# Patient Record
Sex: Male | Born: 1935 | Race: White | Hispanic: No | Marital: Married | Smoking: Former smoker
Health system: Southern US, Community
[De-identification: ages and names within clinical notes are randomized; demographics above are authoritative.]

## PROBLEM LIST (undated history)

## (undated) DIAGNOSIS — I10 Essential (primary) hypertension: Secondary | ICD-10-CM

## (undated) DIAGNOSIS — I219 Acute myocardial infarction, unspecified: Secondary | ICD-10-CM

## (undated) DIAGNOSIS — K259 Gastric ulcer, unspecified as acute or chronic, without hemorrhage or perforation: Secondary | ICD-10-CM

## (undated) DIAGNOSIS — C801 Malignant (primary) neoplasm, unspecified: Secondary | ICD-10-CM

## (undated) DIAGNOSIS — Z923 Personal history of irradiation: Secondary | ICD-10-CM

## (undated) DIAGNOSIS — N183 Chronic kidney disease, stage 3 unspecified: Secondary | ICD-10-CM

## (undated) DIAGNOSIS — N400 Enlarged prostate without lower urinary tract symptoms: Secondary | ICD-10-CM

## (undated) HISTORY — PX: WRIST SURGERY: SHX841

## (undated) HISTORY — PX: CATARACT EXTRACTION, BILATERAL: SHX1313

## (undated) HISTORY — PX: VASECTOMY: SHX75

## (undated) HISTORY — PX: APPENDECTOMY: SHX54

## (undated) HISTORY — DX: Personal history of irradiation: Z92.3

---

## 2000-06-07 ENCOUNTER — Encounter: Payer: Self-pay | Admitting: Internal Medicine

## 2000-06-07 ENCOUNTER — Encounter: Admission: RE | Admit: 2000-06-07 | Discharge: 2000-06-07 | Payer: Self-pay | Admitting: Internal Medicine

## 2000-07-05 ENCOUNTER — Encounter: Payer: Self-pay | Admitting: Emergency Medicine

## 2000-07-05 ENCOUNTER — Emergency Department (HOSPITAL_COMMUNITY): Admission: EM | Admit: 2000-07-05 | Discharge: 2000-07-05 | Payer: Self-pay | Admitting: Emergency Medicine

## 2000-09-06 ENCOUNTER — Encounter: Admission: RE | Admit: 2000-09-06 | Discharge: 2000-09-06 | Payer: Self-pay | Admitting: Specialist

## 2000-09-06 ENCOUNTER — Encounter: Payer: Self-pay | Admitting: Specialist

## 2002-02-12 HISTORY — PX: CYSTOSCOPY W/ URETERAL STENT PLACEMENT: SHX1429

## 2002-03-03 ENCOUNTER — Encounter: Payer: Self-pay | Admitting: *Deleted

## 2002-03-03 ENCOUNTER — Ambulatory Visit (HOSPITAL_COMMUNITY): Admission: EM | Admit: 2002-03-03 | Discharge: 2002-03-03 | Payer: Self-pay | Admitting: *Deleted

## 2002-03-05 ENCOUNTER — Ambulatory Visit (HOSPITAL_BASED_OUTPATIENT_CLINIC_OR_DEPARTMENT_OTHER): Admission: RE | Admit: 2002-03-05 | Discharge: 2002-03-05 | Payer: Self-pay | Admitting: Urology

## 2002-03-05 ENCOUNTER — Encounter: Payer: Self-pay | Admitting: Urology

## 2002-04-05 ENCOUNTER — Observation Stay (HOSPITAL_COMMUNITY): Admission: EM | Admit: 2002-04-05 | Discharge: 2002-04-06 | Payer: Self-pay | Admitting: Emergency Medicine

## 2002-04-05 ENCOUNTER — Encounter: Payer: Self-pay | Admitting: Emergency Medicine

## 2002-04-06 ENCOUNTER — Encounter: Payer: Self-pay | Admitting: Urology

## 2002-07-16 ENCOUNTER — Encounter: Payer: Self-pay | Admitting: Internal Medicine

## 2002-07-16 ENCOUNTER — Ambulatory Visit (HOSPITAL_COMMUNITY): Admission: RE | Admit: 2002-07-16 | Discharge: 2002-07-16 | Payer: Self-pay | Admitting: Internal Medicine

## 2002-11-28 ENCOUNTER — Ambulatory Visit (HOSPITAL_COMMUNITY): Admission: RE | Admit: 2002-11-28 | Discharge: 2002-11-28 | Payer: Self-pay | Admitting: *Deleted

## 2002-11-28 ENCOUNTER — Encounter (INDEPENDENT_AMBULATORY_CARE_PROVIDER_SITE_OTHER): Payer: Self-pay | Admitting: *Deleted

## 2005-01-15 ENCOUNTER — Encounter: Admission: RE | Admit: 2005-01-15 | Discharge: 2005-01-15 | Payer: Self-pay | Admitting: Internal Medicine

## 2005-02-24 ENCOUNTER — Ambulatory Visit (HOSPITAL_COMMUNITY): Admission: RE | Admit: 2005-02-24 | Discharge: 2005-02-24 | Payer: Self-pay | Admitting: *Deleted

## 2006-11-20 ENCOUNTER — Observation Stay (HOSPITAL_COMMUNITY): Admission: EM | Admit: 2006-11-20 | Discharge: 2006-11-21 | Payer: Self-pay | Admitting: Internal Medicine

## 2006-11-20 ENCOUNTER — Encounter: Payer: Self-pay | Admitting: Emergency Medicine
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
## 2010-07-31 NOTE — Op Note (Signed)
NAMEDARRAN, Eduardo Salazar                        ACCOUNT NO.:  [id number]   MEDICAL RECORD NO.:  [id number]                   PATIENT TYPE:  EMS   LOCATION:  ED                                   FACILITY:  [facility]   PHYSICIAN:  Excell Seltzer. Annabell Howells, M.D.                 DATE OF BIRTH:  24-Nov-1925   DATE OF PROCEDURE:  03/03/2002  DATE OF DISCHARGE:                                 OPERATIVE REPORT   PROCEDURE:  Cystoscopy with insertion of left double-J stent   PREOPERATIVE DIAGNOSIS:  Left proximal ureteral stone.   POSTOPERATIVE DIAGNOSIS:  Left proximal ureteral stone.   SURGEON:  Excell Seltzer. Annabell Howells, M.D.   ANESTHESIA:  General.   DRAINS:  6 French with 24 cm left double-J stent.   COMPLICATIONS:  None.   INDICATIONS:  Eduardo Salazar is a 77 year old white male. who presented with a 24  hour plus period of  severe left flank pain with nausea.  A CT in the ER  revealed a 7 mm proximal left ureteral stone as well as several other renal  stones on the left and a single renal stone on the right.  His pain was  poorly controlled with medications.  It was felt that stenting was indicated  in preparation for lithotripsy.   FINDINGS AND PROCEDURE:  The patient was taken to the operating room where a  general anesthetic was induced.  He was placed in lithotomy position.  His  perineum and genitalia were prepped with Betadine solution.  He was draped  in the usual sterile fashion.  Cystoscopy was performed using a 22 [place]  scope and the 12 and 70 degree lenses.  Examination revealed a normal  urethra.  The prostatic urethra had bilobar hyperplasia with some mild  obstruction.  Examination of the bladder revealed mild trabeculation.  No  tumor, stones or inflammation were noted.  The ureteral orifices were in  their normal anatomic position.  The left ureteral orifice was cannulated  with a guidewire.  This was advanced to the stone.  I was unable to get the  wire by the stone initially, but I  slipped a 5 [place] open-ended catheter  over the wire.  This stiffened the wire and allowed the wire to push the  stone back into the kidney.  The wire was then advanced to the kidney.  The  opening catheter was removed, and a 6 French 24 cm double-J stent was passed  over the wire without difficulty.  Once the stent was in position, the wire  was removed leaving a good coil of stent in the kidney and in the bladder.  This was confirmed with fluoroscopy.  The bladder was then drained; the  scope was removed.  The patient was taken down from lithotomy position.  His  anesthetic was reversed.  He was moved to the recovery room in stable  condition.  He was  given 400 mg of  Cipro IV for the procedure.  There were  no complications.                                               Excell Seltzer. Annabell Howells, M.D.    JJW/MEDQ  D:  03/03/2002  T:  03/03/2002  Job:  [id number]

## 2010-07-31 NOTE — Op Note (Signed)
NAMEMOSI, HANNOLD              ACCOUNT NO.:  [id number]   MEDICAL RECORD NO.:  [id number]          PATIENT TYPE:  AMB   LOCATION:  ENDO                         [facility]ACILITY:  [facility]   PHYSICIAN:  Georgiana Spinner, M.D.    DATE OF BIRTH:  [birth year]   DATE OF PROCEDURE:  02/24/2005  DATE OF DISCHARGE:                                 OPERATIVE REPORT   PROCEDURE:  Colonoscopy.   INDICATIONS:  Colon polyps removed by me in the past.   ANESTHESIA:  Demerol 40, Versed 6 mg.   DESCRIPTION OF PROCEDURE:  With the patient mildly sedated in the left  lateral decubitus position, a rectal exam was performed. Subsequently, the  Olympus videoscopic colonoscope was inserted into the rectum and passed  under direct vision to the cecum identified by the ileocecal valve and  appendiceal orifice both of which were photographed. From this point, the  colonoscope was slowly withdrawn taking circumferential views of the colonic  mucosa stopping only in the rectum which appeared normal on direct and  showed hemorrhoids on retroflexed view. The endoscope was straightened and  withdrawn. The patient's vital signs and pulse oximeter remained stable. The  patient tolerated the procedure well without apparent complications.   FINDINGS:  Diverticulosis of sigmoid colon, internal hemorrhoids otherwise  unremarkable examination.   PLAN:  Repeat examination possibly in 5 years.           ______________________________  Georgiana Spinner, M.D.     GMO/MEDQ  D:  02/24/2005  T:  02/24/2005  Job:  [id number]

## 2010-07-31 NOTE — Op Note (Signed)
   [name], Eduardo Salazar                        ACCOUNT NO.:  [id number]   MEDICAL RECORD NO.:  [id number]                   PATIENT TYPE:  AMB   LOCATION:  ENDO                                 FACILITY:  [facility]   PHYSICIAN:  Georgiana Spinner, M.D.                 DATE OF BIRTH:  17-Nov-1925   DATE OF PROCEDURE:  DATE OF DISCHARGE:                                 OPERATIVE REPORT   PROCEDURE:  Colonoscopy with biopsy.   INDICATIONS:  Colon polyps.   ANESTHESIA:  Demerol 50 mg, Versed 5.   DESCRIPTION OF PROCEDURE:  With the patient mildly sedated and in the left  lateral decubitus position, the Olympus videoscopic colonoscope was inserted  in the rectum and passed after normal rectal examination to the cecum,  identified by ileocecal valve and appendiceal orifice, both of which were  photographed in the cecum with a small polyp which was removed using hot  biopsy forceps technique, setting 21/50 blended current on the ERBE argon  photocoagulator pulse generator. From this point, the colonoscope was slowly  withdrawn, taking circumferential views of the colonic mucosa, stopping only  to photograph diverticulosis that was quite significant in the sigmoid colon  until it reached the rectum which appeared normal on direct and retroflexed  view.  The endoscope was straightened and withdrawn.  The patient's vital  signs and pulse oximetry remained stable.  The patient tolerated the  procedure well without apparent complications.   FINDINGS:  1. Diverticulosis significant of the sigmoid colon.  2. Small polyp in the cecum.   PLAN:  Await biopsy report.  The patient will call me with the results and  follow up with me as an outpatient.                                               Georgiana Spinner, M.D.    GMO/MEDQ  D:  11/28/2002  T:  11/28/2002  Job:  [id number]

## 2010-12-25 LAB — CBC
Hemoglobin: 14.8
MCHC: 34.2
MCV: 89.9
RBC: 4.8
RDW: 13.8

## 2010-12-25 LAB — BASIC METABOLIC PANEL
CO2: 25
Chloride: 106
Creatinine, Ser: 0.93
GFR calc Af Amer: >60
Glucose, Bld: 118 — ABNORMAL HIGH

## 2010-12-25 LAB — COMPREHENSIVE METABOLIC PANEL
CO2: 26
Calcium: 8.8
Creatinine, Ser: 1.17
GFR calc Af Amer: >60
GFR calc non Af Amer: >60
Glucose, Bld: 131 — ABNORMAL HIGH

## 2012-01-16 ENCOUNTER — Emergency Department (HOSPITAL_COMMUNITY): Payer: Medicare Other

## 2012-01-16 ENCOUNTER — Emergency Department (HOSPITAL_COMMUNITY)
Admission: EM | Admit: 2012-01-16 | Discharge: 2012-01-16 | Disposition: A | Discharge status: Home / Self Care | Payer: Medicare Other | Attending: Emergency Medicine | Admitting: Emergency Medicine

## 2012-01-16 ENCOUNTER — Encounter (HOSPITAL_COMMUNITY): Payer: Self-pay | Admitting: Emergency Medicine

## 2012-01-16 DIAGNOSIS — Y9301 Activity, walking, marching and hiking: Secondary | ICD-10-CM | POA: Insufficient documentation

## 2012-01-16 DIAGNOSIS — Z9181 History of falling: Secondary | ICD-10-CM | POA: Insufficient documentation

## 2012-01-16 DIAGNOSIS — Z872 Personal history of diseases of the skin and subcutaneous tissue: Secondary | ICD-10-CM | POA: Insufficient documentation

## 2012-01-16 DIAGNOSIS — Z8739 Personal history of other diseases of the musculoskeletal system and connective tissue: Secondary | ICD-10-CM | POA: Insufficient documentation

## 2012-01-16 DIAGNOSIS — Y929 Unspecified place or not applicable: Secondary | ICD-10-CM | POA: Insufficient documentation

## 2012-01-16 DIAGNOSIS — M6281 Muscle weakness (generalized): Secondary | ICD-10-CM | POA: Insufficient documentation

## 2012-01-16 DIAGNOSIS — R296 Repeated falls: Secondary | ICD-10-CM | POA: Insufficient documentation

## 2012-01-16 DIAGNOSIS — T07XXXA Unspecified multiple injuries, initial encounter: Secondary | ICD-10-CM | POA: Insufficient documentation

## 2012-01-16 LAB — BASIC METABOLIC PANEL
CO2: 23 mEq/L (ref 19–32)
Calcium: 9.3 mg/dL (ref 8.4–10.5)
Chloride: 102 mEq/L (ref 96–112)
Potassium: 4.5 mEq/L (ref 3.5–5.1)
Sodium: 133 mEq/L — ABNORMAL LOW (ref 135–145)

## 2012-01-16 LAB — CBC
HCT: 39.5 % (ref 39.0–52.0)
Hemoglobin: 14 g/dL (ref 13.0–17.0)
MCH: 30.6 pg (ref 26.0–34.0)
MCHC: 35.4 g/dL (ref 30.0–36.0)
MCV: 86.4 fL (ref 78.0–100.0)
Platelets: 141 K/uL — ABNORMAL LOW (ref 150–400)
RBC: 4.57 MIL/uL (ref 4.22–5.81)
RDW: 13.5 % (ref 11.5–15.5)
WBC: 7.4 K/uL (ref 4.0–10.5)

## 2012-01-16 MED ORDER — SODIUM CHLORIDE 0.9 % IV SOLN
Freq: Once | INTRAVENOUS | Status: AC
  Administered 2012-01-16: 14:00:00 via INTRAVENOUS

## 2012-01-16 MED ORDER — ONDANSETRON HCL 4 MG/2ML IJ SOLN
4.0000 mg | Freq: Once | INTRAMUSCULAR | Status: AC
  Administered 2012-01-16: 4 mg via INTRAVENOUS
  Filled 2012-01-16: qty 2

## 2012-01-16 MED ORDER — MORPHINE SULFATE 4 MG/ML IJ SOLN
4.0000 mg | Freq: Once | INTRAMUSCULAR | Status: AC
  Administered 2012-01-16: 4 mg via INTRAVENOUS
  Filled 2012-01-16: qty 1

## 2012-01-16 NOTE — ED Provider Notes (Addendum)
History     CSN: [id number]  Arrival date & time 01/16/12  1045   First MD Initiated Contact with Patient 01/16/12 1105      Chief Complaint  Patient presents with  . Fall    (Consider location/radiation/quality/duration/timing/severity/associated sxs/prior treatment) HPI Complains of left hip pain and right elbow pain after "my legs gave out from under me" approximate one hour ago while taking his morning walk. Patient's wife reports that he is prone to frequent falls and gait is unsteady at baseline. Walks with pain. No treatment prior to coming here. Pain is moderate to severe. Pain is worse with movement improved with remaining still. Past Medical History  Diagnosis Date  . Arthritis   . Left leg weakness   . Right leg weakness   . Skin infection     History reviewed. No pertinent past surgical history.  No family history on file.  History  Substance Use Topics  . Smoking status: Never Smoker   . Smokeless tobacco: Not on file  . Alcohol Use: No      Review of Systems  Constitutional: Negative.   HENT: Negative.   Respiratory: Negative.   Cardiovascular: Negative.   Gastrointestinal: Negative.   Musculoskeletal: Positive for arthralgias.  Skin: Negative.   Neurological: Negative.        Unsteady gait at baseline, walks with pain  Hematological: Negative.   Psychiatric/Behavioral: Negative.   All other systems reviewed and are negative.    Allergies  Review of patient's allergies indicates no known allergies.  Home Medications  No current outpatient prescriptions on file.  BP 130/78  Pulse 69  Temp 97.5 F (36.4 C) (Oral)  Resp 21  SpO2 100%  Physical Exam  Nursing note and vitals reviewed. Constitutional: He appears well-developed and well-nourished.  HENT:  Head: Normocephalic and atraumatic.  Eyes: Conjunctivae normal are normal. Pupils are equal, round, and reactive to light.  Neck: Neck supple. No tracheal deviation present. No  thyromegaly present.  Cardiovascular: Normal rate and regular rhythm.   No murmur heard. Pulmonary/Chest: Effort normal and breath sounds normal.  Abdominal: Soft. Bowel sounds are normal. He exhibits no distension. There is no tenderness.  Musculoskeletal: Normal range of motion. He exhibits tenderness. He exhibits no edema.       Entire spine is nontender pelvis is stable nontender. Left lower extremity: patient exhibits pain at left hip on internal rotation of the thigh. DP pulse 2+. Right upper extremity no deformity no swelling he is tender at the posterior aspect of the left elbow full range of motion radial pulse 2+ all extremities without redness swelling or tenderness neurovascularly intact  Neurological: He is alert. Coordination normal.  Skin: Skin is warm and dry. No rash noted.  Psychiatric: He has a normal mood and affect. His behavior is normal. Thought content normal.    ED Course  Procedures (including critical care time)  Labs Reviewed - No data to display No results found. Results for orders placed during the hospital encounter of 01/16/12  BASIC METABOLIC PANEL      Component Value Range   Sodium 133 (*) 135 - 145 mEq/L   Potassium 4.5  3.5 - 5.1 mEq/L   Chloride 102  96 - 112 mEq/L   CO2 23  19 - 32 mEq/L   Glucose, Bld 115 (*) 70 - 99 mg/dL   BUN 17  6 - 23 mg/dL   Creatinine, Ser 4.69 (*) 0.50 - 1.35 mg/dL   Calcium 9.3  8.4 - 10.5 mg/dL   GFR calc non Af Amer 46 (*) >90 mL/min   GFR calc Af Amer 53 (*) >90 mL/min  CBC      Component Value Range   WBC 7.4  4.0 - 10.5 K/uL   RBC 4.57  4.22 - 5.81 MIL/uL   Hemoglobin 14.0  13.0 - 17.0 g/dL   HCT 16.1  09.6 - 04.5 %   MCV 86.4  78.0 - 100.0 fL   MCH 30.6  26.0 - 34.0 pg   MCHC 35.4  30.0 - 36.0 g/dL   RDW 40.9  81.1 - 91.4 %   Platelets 141 (*) 150 - 400 K/uL   Dg Elbow Complete Left  01/16/2012  *RADIOLOGY REPORT*  Clinical Data: Left elbow pain  LEFT ELBOW - COMPLETE 3+ VIEW  Comparison: None   Findings: There is no evidence of fracture or dislocation.  There is no evidence of arthropathy or other focal bone abnormality. Soft tissues are unremarkable.  IMPRESSION: Negative examination.   Original Report Authenticated By: Signa Kell, M.D.    Dg Hip Complete Left  01/16/2012  *RADIOLOGY REPORT*  Clinical Data: status post fall  LEFT HIP - COMPLETE 2+ VIEW  Comparison: None  Findings: There is moderate degenerative joint disease involving the hips.  There is no acute fracture or subluxation identified.  No radiopaque foreign bodies or soft tissue calcifications.  IMPRESSION:  1.  No acute findings. 2.  Bilateral hip osteoarthritis.   Original Report Authenticated By: Signa Kell, M.D.    Mr Hip Left Wo Contrast  01/16/2012  *RADIOLOGY REPORT*  Clinical Data: Larey Seat.  Severe left hip pain with negative x-rays.  MRI OF THE LEFT HIP WITHOUT CONTRAST  Technique:  Multiplanar, multisequence MR imaging was performed. No intravenous contrast was administered.  Comparison: Left hip radiographs 01/16/2012.  Findings: Both hips are normally located.  There are mild to moderate symmetric hip joint degenerative changes bilaterally.  No findings for acute hip fracture or avascular necrosis.  The pubic symphysis and SI joints are intact.  No pubic rami or sacral fracture.  No joint effusion.  No para-articular fluid collections to suggest a paralabral cyst.  The surrounding hip and pelvic musculature appear normal.  No findings for acute muscle tear or myositis.  No findings for trochanteric bursitis.  No significant intrapelvic findings.  The bladder is distended and demonstrates marked trabeculation.  This is likely due to partial bladder outlet obstruction from an enlarged prostate gland.  Mild median lobe hypertrophy with impression on the base of the bladder.  IMPRESSION:  1.  No MR findings for acute/occult hip fracture or pelvic fracture. 2.  No muscle tear, myositis or trochanteric bursitis. 3.  Distended  trabeculated bladder likely due to partial bladder outlet obstruction from an enlarged prostate gland.   Original Report Authenticated By: Rudie Meyer, M.D.      No diagnosis found.  3:30 PM pain is improved after treatment with intravenous morphine. He is able to walk with a cane, gait is slightly unsteady but he walks unassisted he was advised to use a walker but refused a walker.  MDM  MRI of the hip was ordered to rule out occult fracture which was negative. All x-rays reviewed by me And Tylenol for pain Patient advised to use a walker Diagnosis #1 fall 2 contusions multiple sites        Doug Sou, MD 01/16/12 1541  Doug Sou, MD 01/16/12 1542

## 2012-01-16 NOTE — ED Notes (Signed)
Patient walking outside with a cane legs gave out crawled for help and friend helped patient into a chair.  Pain left hip and left femur with no shortening or deformity per EMS. AX4 No loc.

## 2012-01-16 NOTE — ED Notes (Signed)
Patient bilateral feet skin dry are cold to touch states they are always cold.  Pedal pulse +2 bilateral

## 2012-01-16 NOTE — ED Notes (Signed)
States feels slight nausea after pain medication administration.

## 2012-03-15 DIAGNOSIS — I219 Acute myocardial infarction, unspecified: Secondary | ICD-10-CM

## 2012-03-15 HISTORY — DX: Acute myocardial infarction, unspecified: I21.9

## 2012-05-03 ENCOUNTER — Other Ambulatory Visit: Payer: Self-pay | Admitting: Cardiology

## 2012-05-03 DIAGNOSIS — R600 Localized edema: Secondary | ICD-10-CM

## 2012-05-09 ENCOUNTER — Ambulatory Visit
Admission: RE | Admit: 2012-05-09 | Discharge: 2012-05-09 | Disposition: A | Discharge status: Home / Self Care | Payer: Medicare Other | Source: Ambulatory Visit | Attending: Cardiology | Admitting: Cardiology

## 2012-05-09 DIAGNOSIS — R600 Localized edema: Secondary | ICD-10-CM

## 2012-05-10 NOTE — Progress Notes (Unsigned)
Patient ID: MACOY RODWELL, male   DOB: December 04, 1925, 79 y.o.   MRN: [id number] *RADIOLOGY REPORT* ESTABLISHED PATIENT OFFICE VISIT - LEVEL III [phone]) May 09, 2012 Delrae Rend, M.D. [address] [place], [place]  [address]  RE:  Eduardo Salazar (DOB: 1925-09-12) Dear Dr. Jacinto Halim:  Thank you for the referral of your patient Eduardo Salazar for consultation regarding his bilateral lower extremity swelling and pain.   As you know, this patient has had about two years of worsening lower extremity swelling and pain primarily infrapopliteal.  He had a fall back in November and he feels like this exacerbated his symptoms.  Currently he is largely disabled secondary to his lower extremity symptoms, sitting for 20-22 hours of the day in his recliner.  However, elevation does not seem to help the symptoms.  He complains of bilateral calf and ankle swelling, pain, tiredness and redness.  He has a small lesion on the anterior aspect of his right calf with an overlying scab, no weeping.  No history of DVT, PE or superficial thrombophlebitis.  No previous varicose of spider vein treatments.  No family history of varicose or spider veins.  He is a retired [employer].  He is using Tramadol 50 mg q day for pain.   Past medical history is significant for type 2 diabetes, arthritis, previous appendectomy, peptic ulcer disease, right arm RSD.   Current medications:  Tramadol 50 mg prn pain, spironolactone 25 mg q day. No known drug allergies.  No allergy to latex or iodinated contrast.   He is married, with three children, seven siblings, lives in [place].  No alcohol or tobacco use.  Daily caffeine use.  No recreational drugs.   On exam, he is of normal mood and affect.  5'7", 180 pounds.  Blood pressure 138/61, pulse 68, respirations 16, afebrile, 02 sats 99 percent on room air.   He has 3-4+ pitting edema in his mid to lower calf and ankle bilaterally.  The skin is mildly reddened without significant  chronic discoloration.  There is a small scab along the anterior right calf.  No weeping wound or trophic change.  Palpable pulses.   Today's venous ultrasound, reported separately, shows a normal deep venous system bilaterally.  There is no significant valvular incompetence or reflux of the superficial venous system to suggest a primarily venous etiology of his lower extremity swelling or pain.   We spent the majority of the 45 minute consultation discussing the pathophysiology of varicose vein disease.  We discussed his ultrasound findings, specifically that there is no significant varicose vein disease.  We did discuss that there are a multitude of potential etiologies of bilateral lower extremity swelling, and his workup will likely continue.   Again, I appreciate your allowing [place] to participate in the care of this pleasant patient.  As he does not have any significant superficial venous valvular incompetence or reflux, we really have nothing to add from a treatment standpoint to help with his lower extremity swelling or pain.  He may want to consider compression garments while his workup continues.   Please let me know if I can be of further assistance.  He will follow-up primarily with you. Sincerely, D. Oley Balm, M.D. Lavone NianRodrigo Ran, M.D.

## 2012-07-25 ENCOUNTER — Other Ambulatory Visit: Payer: Self-pay | Admitting: Urology

## 2012-07-26 ENCOUNTER — Encounter (HOSPITAL_COMMUNITY): Payer: Self-pay | Admitting: Pharmacy Technician

## 2012-07-27 ENCOUNTER — Encounter (HOSPITAL_COMMUNITY)
Admission: RE | Admit: 2012-07-27 | Discharge: 2012-07-27 | Disposition: A | Discharge status: Home / Self Care | Payer: Medicare Other | Source: Ambulatory Visit | Attending: Urology | Admitting: Urology

## 2012-07-27 ENCOUNTER — Encounter (HOSPITAL_COMMUNITY): Payer: Self-pay

## 2012-07-27 HISTORY — DX: Essential (primary) hypertension: I10

## 2012-07-27 HISTORY — DX: Acute myocardial infarction, unspecified: I21.9

## 2012-07-27 LAB — CBC
HCT: 37.8 % — ABNORMAL LOW (ref 39.0–52.0)
Hemoglobin: 13 g/dL (ref 13.0–17.0)
MCH: 29.5 pg (ref 26.0–34.0)
MCHC: 34.4 g/dL (ref 30.0–36.0)
MCV: 85.7 fL (ref 78.0–100.0)
Platelets: 196 10*3/uL (ref 150–400)
RBC: 4.41 MIL/uL (ref 4.22–5.81)
RDW: 13.9 % (ref 11.5–15.5)
WBC: 8 10*3/uL (ref 4.0–10.5)

## 2012-07-27 LAB — BASIC METABOLIC PANEL
BUN: 23 mg/dL (ref 6–23)
CO2: 26 mEq/L (ref 19–32)
Calcium: 9.6 mg/dL (ref 8.4–10.5)
Chloride: 100 mEq/L (ref 96–112)
Creatinine, Ser: 1.6 mg/dL — ABNORMAL HIGH (ref 0.50–1.35)
GFR calc Af Amer: 44 mL/min — ABNORMAL LOW (ref >90)
GFR calc non Af Amer: 38 mL/min — ABNORMAL LOW (ref >90)
Glucose, Bld: 100 mg/dL — ABNORMAL HIGH (ref 70–99)
Potassium: 5 mEq/L (ref 3.5–5.1)
Sodium: 135 mEq/L (ref 135–145)

## 2012-07-27 LAB — SURGICAL PCR SCREEN
MRSA, PCR: NEGATIVE
Staphylococcus aureus: NEGATIVE

## 2012-07-27 NOTE — Pre-Procedure Instructions (Signed)
07-27-12 EKG/Stress 4'14.

## 2012-07-27 NOTE — Patient Instructions (Addendum)
[address] BIRDIE [address]EVERIDGE  07/27/2012   Your procedure is scheduled on:   5-15--2014  Report to [facility] at      0530  AM   Call this number if you have problems the morning of surgery: [phone]  Or Presurgical Testing [phone](Lenoria Narine)     Do not eat food:After Midnight.    Take these medicines the morning of surgery with A SIP OF WATER: Carvedilol. Ultram as needed.   Do not wear jewelry, make-up or nail polish.  Do not wear lotions, powders, or perfumes. You may wear deodorant.  Do not shave 12 hours prior to first CHG shower(legs and under arms).(face and neck okay.)  Do not bring valuables to the hospital.  Contacts, dentures or bridgework,body piercing,  may not be worn into surgery.  Leave suitcase in the car. After surgery it may be brought to your room.  For patients admitted to the hospital, checkout time is 11:00 AM the day of discharge.   Patients discharged the day of surgery will not be allowed to drive home. Must have responsible person with you x 24 hours once discharged.  Name and phone number of your driver: Hurshel Keys, daughter [phone]  Special Instructions: CHG(Chlorhedine 4%-"Hibiclens","Betasept","Aplicare") Shower Use Special Wash: see special instructions.(avoid face and genitals)   Please read over the following fact sheets that you were given: MRSA Information.    Failure to follow these instructions may result in Cancellation of your surgery.   Patient signature_______________________________________________________

## 2012-07-30 MED ORDER — CIPROFLOXACIN IN D5W 200 MG/100ML IV SOLN
200.0000 mg | INTRAVENOUS | Status: AC
  Administered 2012-07-31: 200 mg via INTRAVENOUS
  Filled 2012-07-30: qty 100

## 2012-07-30 NOTE — H&P (Signed)
History of Present Illness               Microscopic hematuria: He was found to have 11-30 RBC/hpf. He has a history of nephrolithiasis.   Renal insufficiency: He has been found to have progressively worsening renal function possibly in part secondary to his CHF.  2/14 -  1.33 3/14  1.42 4/14 - 1.82   Erectile dysfunction: This is been present since 5/95.   Nephrolithiasis: He underwent lithotripsy of a left ureteral stone in 1/04 by Dr. Annabell Howells. A followup KUB in 2/04 revealed a small stone in the lower pole the right kidney as well as a second 8 mm stone on the right-hand side.   LUTS: He does have fairly significant voiding symptoms consisting of urgency with occasional urge incontinence as well as a sense of needing to void but finding he was unable to initiate his stream. This is associated with nocturia x5.   Interval history: He reports he has been having some lower abdominal discomfort mainly in the evenings. No new voiding complaints are noted.   Past Medical History Problems  1. History of  Arthritis V13.4 2. History of  Chronic Systolic Congestive Heart Failure 428.22 3. History of  Renal Insufficiency 593.9  Surgical History Problems  1. History of  Appendectomy 2. History of  Surgery Of Male Genitalia Vasectomy V25.2  Current Meds 1. Carvedilol 3.125 MG Oral Tablet; Therapy: (Recorded:30Apr2014) to 2. Lisinopril 2.5 MG Oral Tablet; Therapy: (Recorded:30Apr2014) to 3. Spironolactone 25 MG Oral Tablet; Therapy: (Recorded:30Apr2014) to 4. Ultram 50 MG Oral Tablet; Therapy: (Recorded:30Apr2014) to  Allergies Medication  1. No Known Drug Allergies  Social History Problems  1. Caffeine Use 2. Former Smoker V15.82 1/2ppdx30 years ago 3. Marital History - Currently Married Denied  4. History of  Alcohol Use  Review of Systems Genitourinary, constitutional, skin, eye, otolaryngeal, hematologic/lymphatic, cardiovascular, pulmonary, endocrine, musculoskeletal,  gastrointestinal, neurological and psychiatric system(s) were reviewed and pertinent findings if present are noted.  Genitourinary: urinary frequency, feelings of urinary urgency, nocturia, incontinence, difficulty starting the urinary stream, weak urinary stream, hematuria, erectile dysfunction and initiating urination requires straining, but no dysuria.  Gastrointestinal: nausea and constipation.  Constitutional: feeling tired (fatigue) and recent weight loss.  Integumentary: skin rash/lesion and pruritus.  ENT: sore throat and sinus problems.  Hematologic/Lymphatic: a tendency to easily bruise.  Respiratory: cough.  Musculoskeletal: joint pain.  Psychiatric: anxiety.    Vitals Vital Signs BMI Calculated: 23.54 BSA Calculated: 1.79 Height: 5 ft 7 in Weight: 150 lb  Blood Pressure: 97 / 69 Temperature: 97.2 F Heart Rate: 72  Physical Exam Constitutional: Well nourished and well developed . No acute distress.  ENT:. The ears and nose are normal in appearance.  Neck: The appearance of the neck is normal and no neck mass is present.  Pulmonary: No respiratory distress and normal respiratory rhythm and effort.  Cardiovascular: Heart rate and rhythm are normal . No peripheral edema.  Abdomen: The abdomen is soft and nontender. No masses are palpated. No CVA tenderness. No hernias are palpable. No hepatosplenomegaly noted.  Rectal: Rectal exam demonstrates normal sphincter tone, no tenderness and no masses. Estimated prostate size is 1+. The prostate has no nodularity and is not tender. The left seminal vesicle is nonpalpable. The right seminal vesicle is nonpalpable. The perineum is normal on inspection.  Genitourinary: Examination of the penis demonstrates no discharge, no masses, no lesions and a normal meatus. The penis is uncircumcised. The scrotum is without lesions. The  right epididymis is palpably normal and non-tender. The left epididymis is palpably normal and non-tender. The right  testis is non-tender and without masses. The left testis is non-tender and without masses.  Lymphatics: The femoral and inguinal nodes are not enlarged or tender.  Skin: Normal skin turgor, no visible rash and no visible skin lesions.  Neuro/Psych:. Mood and affect are appropriate.   Assessment  Assessed  1. Microscopic Hematuria 599.72 2. Urge Incontinence Of Urine 788.31 3. Distal Ureteral Stone On The Left 592.1 4. Hydronephrosis On The Left 591 5. Nephrolithiasis Of Both Kidneys 592.0   I went over the results of his CT scan with him today. It has revealed left hydronephrosis and also a stone in the distal left ureter. There is an indication by the radiologist that there may be some form of ectopic insertion of the ureter in the bladder however reviewing the operative note by Dr. Clydie Braun done in 12/03 he indicates that the left ureteral orifice was in a normal position. I have discussed with the patient the need to remove the stone and simultaneously evaluate the cause of the hydronephrosis which very possibly could be due to the presence of the stone however a distal ureteral neoplasm needs to be ruled out with retrograde pyelogram and ureteroscopy. I've gone over the procedure with him in detail including its risks and complications, the alternatives, the probability is of success and the outpatient nature of the procedure as well as anticipated postoperative course. He understands and has elected to proceed.  This will also allow me to evaluate the bladder completely in order to complete his hematuria workup. I told him however that I felt quite confident that his hematuria was due to the findings noted on his CT scan.   Plan   He will be scheduled for outpatient cystoscopy with left retrograde pyelogram, ureteroscopy, stone extraction and stent placement.

## 2012-07-31 ENCOUNTER — Encounter (HOSPITAL_COMMUNITY): Payer: Self-pay | Admitting: *Deleted

## 2012-07-31 ENCOUNTER — Ambulatory Visit (HOSPITAL_COMMUNITY): Payer: Medicare Other

## 2012-07-31 ENCOUNTER — Ambulatory Visit (HOSPITAL_COMMUNITY): Payer: Medicare Other | Admitting: Anesthesiology

## 2012-07-31 ENCOUNTER — Ambulatory Visit (HOSPITAL_COMMUNITY)
Admission: RE | Admit: 2012-07-31 | Discharge: 2012-07-31 | Disposition: A | Discharge status: Home / Self Care | Payer: Medicare Other | Source: Ambulatory Visit | Attending: Urology | Admitting: Urology

## 2012-07-31 ENCOUNTER — Encounter (HOSPITAL_COMMUNITY): Payer: Self-pay | Admitting: Anesthesiology

## 2012-07-31 ENCOUNTER — Encounter (HOSPITAL_COMMUNITY)
Admission: RE | Disposition: A | Discharge status: Home / Self Care | Payer: Self-pay | Source: Ambulatory Visit | Attending: Urology

## 2012-07-31 DIAGNOSIS — Z8739 Personal history of other diseases of the musculoskeletal system and connective tissue: Secondary | ICD-10-CM | POA: Insufficient documentation

## 2012-07-31 DIAGNOSIS — R351 Nocturia: Secondary | ICD-10-CM | POA: Insufficient documentation

## 2012-07-31 DIAGNOSIS — N289 Disorder of kidney and ureter, unspecified: Secondary | ICD-10-CM | POA: Insufficient documentation

## 2012-07-31 DIAGNOSIS — N1330 Unspecified hydronephrosis: Secondary | ICD-10-CM | POA: Insufficient documentation

## 2012-07-31 DIAGNOSIS — Z79899 Other long term (current) drug therapy: Secondary | ICD-10-CM | POA: Insufficient documentation

## 2012-07-31 DIAGNOSIS — R3129 Other microscopic hematuria: Secondary | ICD-10-CM | POA: Insufficient documentation

## 2012-07-31 DIAGNOSIS — N529 Male erectile dysfunction, unspecified: Secondary | ICD-10-CM | POA: Insufficient documentation

## 2012-07-31 DIAGNOSIS — I5022 Chronic systolic (congestive) heart failure: Secondary | ICD-10-CM | POA: Insufficient documentation

## 2012-07-31 DIAGNOSIS — N3941 Urge incontinence: Secondary | ICD-10-CM | POA: Insufficient documentation

## 2012-07-31 DIAGNOSIS — N201 Calculus of ureter: Secondary | ICD-10-CM

## 2012-07-31 DIAGNOSIS — I509 Heart failure, unspecified: Secondary | ICD-10-CM | POA: Insufficient documentation

## 2012-07-31 HISTORY — PX: CYSTOSCOPY WITH RETROGRADE PYELOGRAM, URETEROSCOPY AND STENT PLACEMENT: SHX5789

## 2012-07-31 SURGERY — CYSTOURETEROSCOPY, WITH RETROGRADE PYELOGRAM AND STENT INSERTION
Anesthesia: General | Site: Penis | Laterality: Left | Wound class: Clean Contaminated

## 2012-07-31 MED ORDER — KETOROLAC TROMETHAMINE 15 MG/ML IJ SOLN
INTRAMUSCULAR | Status: DC | PRN
  Administered 2012-07-31: 15 mg via INTRAVENOUS

## 2012-07-31 MED ORDER — LIDOCAINE HCL (CARDIAC) 20 MG/ML IV SOLN
INTRAVENOUS | Status: DC | PRN
  Administered 2012-07-31: 50 mg via INTRAVENOUS

## 2012-07-31 MED ORDER — FENTANYL CITRATE 0.05 MG/ML IJ SOLN
INTRAMUSCULAR | Status: DC | PRN
  Administered 2012-07-31: 25 ug via INTRAVENOUS

## 2012-07-31 MED ORDER — IOHEXOL 300 MG/ML  SOLN
INTRAMUSCULAR | Status: AC
  Filled 2012-07-31: qty 2

## 2012-07-31 MED ORDER — LACTATED RINGERS IV SOLN
INTRAVENOUS | Status: DC | PRN
  Administered 2012-07-31: 07:00:00 via INTRAVENOUS

## 2012-07-31 MED ORDER — PROPOFOL 10 MG/ML IV BOLUS
INTRAVENOUS | Status: DC | PRN
  Administered 2012-07-31: 70 mg via INTRAVENOUS

## 2012-07-31 MED ORDER — PHENAZOPYRIDINE HCL 200 MG PO TABS
200.0000 mg | ORAL_TABLET | Freq: Once | ORAL | Status: AC
  Administered 2012-07-31: 200 mg via ORAL

## 2012-07-31 MED ORDER — SODIUM CHLORIDE 0.9 % IR SOLN
Status: DC | PRN
  Administered 2012-07-31: 1000 mL

## 2012-07-31 MED ORDER — LACTATED RINGERS IV SOLN: INTRAVENOUS | Status: DC

## 2012-07-31 MED ORDER — 0.9 % SODIUM CHLORIDE (POUR BTL) OPTIME
TOPICAL | Status: DC | PRN
  Administered 2012-07-31: 1000 mL

## 2012-07-31 MED ORDER — HYDROCODONE-ACETAMINOPHEN 7.5-325 MG PO TABS: 1.0000 | ORAL_TABLET | ORAL | Status: DC | PRN

## 2012-07-31 MED ORDER — FENTANYL CITRATE 0.05 MG/ML IJ SOLN: 25.0000 ug | INTRAMUSCULAR | Status: DC | PRN

## 2012-07-31 MED ORDER — IOHEXOL 300 MG/ML  SOLN
INTRAMUSCULAR | Status: DC | PRN
  Administered 2012-07-31: 15 mL via INTRAVENOUS

## 2012-07-31 MED ORDER — ONDANSETRON HCL 4 MG/2ML IJ SOLN
INTRAMUSCULAR | Status: DC | PRN
  Administered 2012-07-31: 4 mg via INTRAVENOUS

## 2012-07-31 MED ORDER — EPHEDRINE SULFATE 50 MG/ML IJ SOLN
INTRAMUSCULAR | Status: DC | PRN
  Administered 2012-07-31: 5 mg via INTRAVENOUS

## 2012-07-31 MED ORDER — TAMSULOSIN HCL 0.4 MG PO CAPS
0.4000 mg | ORAL_CAPSULE | Freq: Once | ORAL | Status: AC
  Administered 2012-07-31: 0.4 mg via ORAL
  Filled 2012-07-31: qty 1

## 2012-07-31 MED ORDER — PHENAZOPYRIDINE HCL 200 MG PO TABS
ORAL_TABLET | ORAL | Status: AC
  Filled 2012-07-31: qty 1

## 2012-07-31 MED ORDER — SODIUM CHLORIDE 0.9 % IR SOLN
Status: DC | PRN
Start: 2012-07-31 — End: 2012-07-31
  Administered 2012-07-31: 3000 mL

## 2012-07-31 MED ORDER — CIPROFLOXACIN IN D5W 400 MG/200ML IV SOLN
INTRAVENOUS | Status: AC
  Filled 2012-07-31: qty 200

## 2012-07-31 SURGICAL SUPPLY — 21 items
ADAPTER CATH URET PLST 4-6FR (CATHETERS) ×3 IMPLANT
ADPR CATH URET STRL DISP 4-6FR (CATHETERS) ×2
BAG URO CATCHER STRL LF (DRAPE) ×3 IMPLANT
BASKET ZERO TIP NITINOL 2.4FR (BASKET) ×2 IMPLANT
BSKT STON RTRVL ZERO TP 2.4FR (BASKET) ×2
CATH INTERMIT  6FR 70CM (CATHETERS) ×3 IMPLANT
CATH URET 5FR 28IN OPEN ENDED (CATHETERS) ×3 IMPLANT
CLOTH BEACON ORANGE TIMEOUT ST (SAFETY) ×3 IMPLANT
DRAPE CAMERA CLOSED 9X96 (DRAPES) ×3 IMPLANT
GLOVE BIOGEL M 8.0 STRL (GLOVE) ×3 IMPLANT
GLOVE SURG SS PI 8.0 STRL IVOR (GLOVE) ×3 IMPLANT
GOWN PREVENTION PLUS XLARGE (GOWN DISPOSABLE) ×3 IMPLANT
GOWN STRL REIN XL XLG (GOWN DISPOSABLE) ×3 IMPLANT
GUIDEWIRE ANG ZIPWIRE 038X150 (WIRE) ×2 IMPLANT
GUIDEWIRE STR DUAL SENSOR (WIRE) ×2 IMPLANT
MANIFOLD NEPTUNE II (INSTRUMENTS) ×3 IMPLANT
MARKER SKIN DUAL TIP RULER LAB (MISCELLANEOUS) ×3 IMPLANT
PACK CYSTO (CUSTOM PROCEDURE TRAY) ×3 IMPLANT
SHEATH URET ACCESS 12FR/35CM (UROLOGICAL SUPPLIES) ×2 IMPLANT
TUBING CONNECTING 10 (TUBING) ×3 IMPLANT
WIRE COONS/BENSON .038X145CM (WIRE) ×3 IMPLANT

## 2012-07-31 NOTE — Transfer of Care (Signed)
Immediate Anesthesia Transfer of Care Note  Patient: Eduardo Salazar  Procedure(s) Performed: Procedure(s) (LRB): CYSTOSCOPY WITH LEFT RETROGRADE PYELOGRAM, LEFT URETEROSCOPY (Left)  Patient Location: PACU  Anesthesia Type: General  Level of Consciousness: sedated, patient cooperative and responds to stimulaton  Airway & Oxygen Therapy: Patient Spontanous Breathing and Patient connected to face mask oxgen  Post-op Assessment: Report given to PACU RN and Post -op Vital signs reviewed and stable  Post vital signs: Reviewed and stable  Complications: No apparent anesthesia complications

## 2012-07-31 NOTE — Anesthesia Preprocedure Evaluation (Addendum)
Anesthesia Evaluation  Patient identified by MRN, date of birth, ID band Patient awake  General Assessment Comment:1. History of  Arthritis V13.4 2. History of  Chronic Systolic Congestive Heart Failure 428.22 3. History of  Renal Insufficiency 593.9     Reviewed: Allergy & Precautions, H&P , NPO status , Patient's Chart, lab work & pertinent test results  Airway Mallampati: II TM Distance: >3 FB Neck ROM: Full    Dental  (+) Edentulous Upper and Edentulous Lower   Pulmonary neg pulmonary ROS,  breath sounds clear to auscultation  Pulmonary exam normal       Cardiovascular hypertension, Pt. on medications and Pt. on home beta blockers + Past MI negative cardio ROS  Rhythm:Regular Rate:Normal  EF 32%. Recent cardiac evaluation. Low risk stress myoview.   Neuro/Psych negative neurological ROS  negative psych ROS   GI/Hepatic negative GI ROS, Neg liver ROS,   Endo/Other  negative endocrine ROS  Renal/GU Renal InsufficiencyRenal diseaseCr 1.6 K 5.0  negative genitourinary   Musculoskeletal negative musculoskeletal ROS (+)   Abdominal   Peds negative pediatric ROS (+)  Hematology negative hematology ROS (+)   Anesthesia Other Findings   Reproductive/Obstetrics negative OB ROS                          Anesthesia Physical Anesthesia Plan  ASA: III  Anesthesia Plan: General   Post-op Pain Management:    Induction: Intravenous  Airway Management Planned: LMA  Additional Equipment:   Intra-op Plan:   Post-operative Plan: Extubation in OR  Informed Consent: I have reviewed the patients History and Physical, chart, labs and discussed the procedure including the risks, benefits and alternatives for the proposed anesthesia with the patient or authorized representative who has indicated his/her understanding and acceptance.   Dental advisory given  Plan Discussed with: CRNA  Anesthesia  Plan Comments:         Anesthesia Quick Evaluation

## 2012-07-31 NOTE — Progress Notes (Signed)
Instructed family on how to empty and remove foley . Instructed to call doctors office 5/20 to see when to remove foley.

## 2012-07-31 NOTE — Progress Notes (Signed)
Per Dr. Vernie Ammons if unable to void may insert foley to leg bag and may go home.

## 2012-07-31 NOTE — Anesthesia Postprocedure Evaluation (Signed)
  Anesthesia Post-op Note  Patient: Eduardo Salazar  Procedure(s) Performed: Procedure(s) (LRB): CYSTOSCOPY WITH LEFT RETROGRADE PYELOGRAM, LEFT URETEROSCOPY (Left)  Patient Location: PACU  Anesthesia Type: General  Level of Consciousness: awake and alert   Airway and Oxygen Therapy: Patient Spontanous Breathing  Post-op Pain: mild  Post-op Assessment: Post-op Vital signs reviewed, Patient's Cardiovascular Status Stable, Respiratory Function Stable, Patent Airway and No signs of Nausea or vomiting  Last Vitals:  Filed Vitals:   07/31/12 0835  BP:   Pulse: 75  Temp:   Resp: 12    Post-op Vital Signs: stable   Complications: No apparent anesthesia complications

## 2012-07-31 NOTE — Progress Notes (Signed)
Up to BR x 2 unable to void. Ambulatory in hakll and to BR tolerated well.

## 2012-07-31 NOTE — Op Note (Signed)
PATIENT:  Eduardo Salazar  PRE-OPERATIVE DIAGNOSIS: 1. Left Ureteral calculus 2. Left Hydronephrosis  POST-OPERATIVE DIAGNOSIS: Same  PROCEDURE:  1. Cystoscopy with left retrograde pyelogram including interpretation. 2. Left ureteroscopy and stone extraction.  SURGEON: Garnett Farm, MD  INDICATION: Eduardo Salazar is an 79 year old male who underwent a CT scan for evaluation of microscopic hematuria. He has a history of bilateral nephrolithiasis. He had been experiencing some lower abdominal discomfort mainly in the evening and a CT scan to evaluate his upper tracts revealed hydronephrosis on the left-hand side with a 3 mm distal ureteral stone and some question by the radiologist of an ectopic insertion of his left ureter. He had had previous stent placement by Dr. Annabell Howells who had described his left ureteral orifice as being in the normal anatomic position. I have discussed with the patient the need for further evaluation of his hydronephrosis, possible abnormal ureteral insertion and treatment of his stone.  ANESTHESIA:  General  EBL:  Minimal  DRAINS: None  SPECIMEN:  Stone  DISPOSITION OF SPECIMEN: Given to patient  DESCRIPTION OF PROCEDURE: The patient was taken to the major OR and placed on the table. General anesthesia was administered and then the patient was moved to the dorsal lithotomy position. The genitalia was sterilely prepped and draped. An official timeout was performed.  Initially the 22 French cystoscope with 12 lens was passed under direct vision down the urethra which is noted be free of any lesions or strictures. The bladder was then entered and fully inspected. It was noted be free of any tumors stones or inflammatory lesions. He was noted to have 4+ trabeculation with pseudo-diverticuli identified. The right ureteral orifice appeared to be a normal anatomic position however the left ureteral orifice did in fact appear to be slightly more laterally placed although it  appeared to have a normal configuration. A 6 French open-ended ureteral catheter was then passed through the cystoscope into the ureteral orifice in order to perform a left retrograde pyelogram.  A retrograde pyelogram was performed by injecting full-strength contrast up the left ureter under direct fluoroscopic control. It revealed a filling defect in the distal ureter consistent with the stone seen on the preoperative KUB. I noted fairly significant J-hooking of the ureter with dilation of the ureter just above its insertion into the bladder. I noted no worrisome appearing filling defects in the distal ureter or throughout the remainder of the ureter or within the intrarenal collecting system. I then passed a 0.038 inch floppy-tipped guidewire through the open ended catheter however I was unable to advance the guidewire up the ureter to a significant degree. It tended curl in the distal ureter so I left it curled in the distal ureter and proceeded with rigid ureteroscopy.  A 6 French rigid ureteroscope was then passed under direct into the bladder and into the left orifice and up the ureter. As I did so I noted no abnormality of the ureteral mucosa. The stone was identified and engaged in a Nitinol basket and extracted without difficulty. I then reinserted the ureteroscope into the left ureter and viewed the entire distal ureter noting some dilation above the ureterovesical junction but no worrisome lesions were present and the reason for the curling was just do to the J. hooking and slight tortuosity of the ureter. I elected not to place ureteral stent as the procedure did not require dilation or significant trauma to the distal ureter.  I then drained the bladder. The patient was awakened and  taken to the recovery room in stable and satisfactory condition. He tolerated the procedure well no intraoperative complications.  It appears that his hydronephrosis may have been do to the stone located in the distal  ureter and although it was as very small stone it would not pass easily do to the presence of J-hooking of the distal ureter. His prostate was noted to be enlarged with an enlarged median lobe and there were changes of long-standing outlet obstruction.  PLAN OF CARE: Discharge to home after PACU  PATIENT DISPOSITION:  PACU - hemodynamically stable.

## 2012-07-31 NOTE — Interval H&P Note (Signed)
History and Physical Interval Note:  07/31/2012 7:22 AM  Eduardo Salazar  has presented today for surgery, with the diagnosis of left hydro left ureteral stone  The various methods of treatment have been discussed with the patient and family. After consideration of risks, benefits and other options for treatment, the patient has consented to  Procedure(s): CYSTOSCOPY WITH LEFT RETROGRADE PYELOGRAM, LEFT URETEROSCOPY, POSSIBLE LASER LITHOTRIPY AND STENT PLACEMENT (Left) HOLMIUM LASER APPLICATION (Left) as a surgical intervention .  The patient's history has been reviewed, patient examined, no change in status, stable for surgery.  I have reviewed the patient's chart and labs.  Questions were answered to the patient's satisfaction.     Garnett Farm

## 2012-08-01 ENCOUNTER — Encounter (HOSPITAL_COMMUNITY): Payer: Self-pay | Admitting: Urology

## 2012-09-12 ENCOUNTER — Other Ambulatory Visit: Payer: Self-pay

## 2012-09-12 ENCOUNTER — Emergency Department (HOSPITAL_COMMUNITY): Payer: Medicare Other

## 2012-09-12 ENCOUNTER — Encounter (HOSPITAL_COMMUNITY): Payer: Self-pay | Admitting: Emergency Medicine

## 2012-09-12 ENCOUNTER — Inpatient Hospital Stay (HOSPITAL_COMMUNITY)
Admission: EM | Admit: 2012-09-12 | Discharge: 2012-09-15 | DRG: 384 | Disposition: A | Discharge status: Home / Self Care | Payer: Medicare Other | Attending: Internal Medicine | Admitting: Internal Medicine

## 2012-09-12 DIAGNOSIS — Z87891 Personal history of nicotine dependence: Secondary | ICD-10-CM

## 2012-09-12 DIAGNOSIS — K2100 Gastro-esophageal reflux disease with esophagitis, without bleeding: Secondary | ICD-10-CM | POA: Diagnosis present

## 2012-09-12 DIAGNOSIS — R1310 Dysphagia, unspecified: Secondary | ICD-10-CM

## 2012-09-12 DIAGNOSIS — I10 Essential (primary) hypertension: Secondary | ICD-10-CM

## 2012-09-12 DIAGNOSIS — I129 Hypertensive chronic kidney disease with stage 1 through stage 4 chronic kidney disease, or unspecified chronic kidney disease: Secondary | ICD-10-CM | POA: Diagnosis present

## 2012-09-12 DIAGNOSIS — R933 Abnormal findings on diagnostic imaging of other parts of digestive tract: Secondary | ICD-10-CM

## 2012-09-12 DIAGNOSIS — I249 Acute ischemic heart disease, unspecified: Secondary | ICD-10-CM

## 2012-09-12 DIAGNOSIS — I248 Other forms of acute ischemic heart disease: Secondary | ICD-10-CM

## 2012-09-12 DIAGNOSIS — I079 Rheumatic tricuspid valve disease, unspecified: Secondary | ICD-10-CM | POA: Diagnosis present

## 2012-09-12 DIAGNOSIS — N1330 Unspecified hydronephrosis: Secondary | ICD-10-CM | POA: Diagnosis present

## 2012-09-12 DIAGNOSIS — K257 Chronic gastric ulcer without hemorrhage or perforation: Secondary | ICD-10-CM

## 2012-09-12 DIAGNOSIS — I2510 Atherosclerotic heart disease of native coronary artery without angina pectoris: Secondary | ICD-10-CM | POA: Diagnosis present

## 2012-09-12 DIAGNOSIS — N189 Chronic kidney disease, unspecified: Secondary | ICD-10-CM

## 2012-09-12 DIAGNOSIS — K2980 Duodenitis without bleeding: Secondary | ICD-10-CM | POA: Diagnosis present

## 2012-09-12 DIAGNOSIS — Z8711 Personal history of peptic ulcer disease: Secondary | ICD-10-CM

## 2012-09-12 DIAGNOSIS — D62 Acute posthemorrhagic anemia: Secondary | ICD-10-CM | POA: Diagnosis present

## 2012-09-12 DIAGNOSIS — K5900 Constipation, unspecified: Secondary | ICD-10-CM | POA: Diagnosis present

## 2012-09-12 DIAGNOSIS — M129 Arthropathy, unspecified: Secondary | ICD-10-CM | POA: Diagnosis present

## 2012-09-12 DIAGNOSIS — N201 Calculus of ureter: Secondary | ICD-10-CM | POA: Diagnosis present

## 2012-09-12 DIAGNOSIS — K449 Diaphragmatic hernia without obstruction or gangrene: Secondary | ICD-10-CM | POA: Diagnosis present

## 2012-09-12 DIAGNOSIS — I252 Old myocardial infarction: Secondary | ICD-10-CM

## 2012-09-12 DIAGNOSIS — N183 Chronic kidney disease, stage 3 unspecified: Secondary | ICD-10-CM | POA: Diagnosis present

## 2012-09-12 DIAGNOSIS — R9431 Abnormal electrocardiogram [ECG] [EKG]: Secondary | ICD-10-CM

## 2012-09-12 DIAGNOSIS — K253 Acute gastric ulcer without hemorrhage or perforation: Secondary | ICD-10-CM

## 2012-09-12 DIAGNOSIS — Z87442 Personal history of urinary calculi: Secondary | ICD-10-CM

## 2012-09-12 DIAGNOSIS — I080 Rheumatic disorders of both mitral and aortic valves: Secondary | ICD-10-CM | POA: Diagnosis present

## 2012-09-12 DIAGNOSIS — K259 Gastric ulcer, unspecified as acute or chronic, without hemorrhage or perforation: Principal | ICD-10-CM | POA: Diagnosis present

## 2012-09-12 DIAGNOSIS — I959 Hypotension, unspecified: Secondary | ICD-10-CM

## 2012-09-12 DIAGNOSIS — Z8669 Personal history of other diseases of the nervous system and sense organs: Secondary | ICD-10-CM

## 2012-09-12 DIAGNOSIS — R109 Unspecified abdominal pain: Secondary | ICD-10-CM

## 2012-09-12 LAB — HEMOGLOBIN AND HEMATOCRIT, BLOOD
HCT: 29 % — ABNORMAL LOW (ref 39.0–52.0)
Hemoglobin: 10.1 g/dL — ABNORMAL LOW (ref 13.0–17.0)

## 2012-09-12 LAB — TYPE AND SCREEN
ABO/RH(D): O POS
Antibody Screen: NEGATIVE

## 2012-09-12 LAB — PROTIME-INR
INR: 1.02 (ref 0.00–1.49)
Prothrombin Time: 13.2 seconds (ref 11.6–15.2)

## 2012-09-12 LAB — POCT I-STAT, CHEM 8
Calcium, Ion: 1.18 mmol/L (ref 1.13–1.30)
Glucose, Bld: 121 mg/dL — ABNORMAL HIGH (ref 70–99)
HCT: 32 % — ABNORMAL LOW (ref 39.0–52.0)
Hemoglobin: 10.9 g/dL — ABNORMAL LOW (ref 13.0–17.0)

## 2012-09-12 LAB — MRSA PCR SCREENING: MRSA by PCR: NEGATIVE

## 2012-09-12 LAB — TROPONIN I
Troponin I: <0.3 ng/mL (ref <0.30)
Troponin I: 0.39 ng/mL (ref <0.30)

## 2012-09-12 LAB — POCT I-STAT TROPONIN I

## 2012-09-12 LAB — CBC
Platelets: 179 10*3/uL (ref 150–400)
RBC: 3.72 MIL/uL — ABNORMAL LOW (ref 4.22–5.81)
WBC: 11.8 10*3/uL — ABNORMAL HIGH (ref 4.0–10.5)

## 2012-09-12 MED ORDER — SODIUM CHLORIDE 0.9 % IV SOLN: INTRAVENOUS | Status: DC

## 2012-09-12 MED ORDER — HEPARIN BOLUS VIA INFUSION: 4000.0000 [IU] | Freq: Once | INTRAVENOUS | Status: DC

## 2012-09-12 MED ORDER — SODIUM CHLORIDE 0.9 % IV BOLUS (SEPSIS)
1000.0000 mL | Freq: Once | INTRAVENOUS | Status: AC
  Administered 2012-09-12: 1000 mL via INTRAVENOUS

## 2012-09-12 MED ORDER — SODIUM CHLORIDE 0.9 % IV BOLUS (SEPSIS): 1000.0000 mL | Freq: Once | INTRAVENOUS | Status: DC

## 2012-09-12 MED ORDER — NITROGLYCERIN 0.4 MG SL SUBL: 0.4000 mg | SUBLINGUAL_TABLET | SUBLINGUAL | Status: DC | PRN

## 2012-09-12 MED ORDER — FENTANYL CITRATE 0.05 MG/ML IJ SOLN
50.0000 ug | Freq: Once | INTRAMUSCULAR | Status: AC
  Administered 2012-09-12: 50 ug via INTRAVENOUS
  Filled 2012-09-12: qty 2

## 2012-09-12 MED ORDER — SUCRALFATE 1 GM/10ML PO SUSP
1.0000 g | Freq: Four times a day (QID) | ORAL | Status: DC
  Administered 2012-09-12 – 2012-09-15 (×10): 1 g via ORAL
  Filled 2012-09-12 (×16): qty 10

## 2012-09-12 MED ORDER — ACETAMINOPHEN 650 MG RE SUPP: 650.0000 mg | Freq: Four times a day (QID) | RECTAL | Status: DC | PRN

## 2012-09-12 MED ORDER — ONDANSETRON HCL 4 MG PO TABS: 4.0000 mg | ORAL_TABLET | Freq: Four times a day (QID) | ORAL | Status: DC | PRN

## 2012-09-12 MED ORDER — PANTOPRAZOLE SODIUM 40 MG IV SOLR
40.0000 mg | Freq: Two times a day (BID) | INTRAVENOUS | Status: DC
  Administered 2012-09-12 – 2012-09-14 (×5): 40 mg via INTRAVENOUS
  Filled 2012-09-12 (×8): qty 40

## 2012-09-12 MED ORDER — TAMSULOSIN HCL 0.4 MG PO CAPS
0.4000 mg | ORAL_CAPSULE | Freq: Every day | ORAL | Status: DC
  Administered 2012-09-12 – 2012-09-15 (×3): 0.4 mg via ORAL
  Filled 2012-09-12 (×5): qty 1

## 2012-09-12 MED ORDER — DEXTROSE-NACL 5-0.45 % IV SOLN
INTRAVENOUS | Status: DC
  Administered 2012-09-12: 08:00:00 via INTRAVENOUS

## 2012-09-12 MED ORDER — HEPARIN (PORCINE) IN NACL 100-0.45 UNIT/ML-% IJ SOLN
800.0000 [IU]/h | INTRAMUSCULAR | Status: DC
  Filled 2012-09-12: qty 250

## 2012-09-12 MED ORDER — PANTOPRAZOLE SODIUM 40 MG IV SOLR
40.0000 mg | Freq: Once | INTRAVENOUS | Status: AC
  Administered 2012-09-12: 40 mg via INTRAVENOUS
  Filled 2012-09-12: qty 40

## 2012-09-12 MED ORDER — ACETAMINOPHEN 325 MG PO TABS: 650.0000 mg | ORAL_TABLET | Freq: Four times a day (QID) | ORAL | Status: DC | PRN

## 2012-09-12 MED ORDER — MORPHINE SULFATE 2 MG/ML IJ SOLN
0.5000 mg | INTRAMUSCULAR | Status: DC | PRN
  Administered 2012-09-12: 1 mg via INTRAVENOUS
  Filled 2012-09-12: qty 1

## 2012-09-12 MED ORDER — NOREPINEPHRINE BITARTRATE 1 MG/ML IJ SOLN
5.0000 ug/min | Freq: Once | INTRAVENOUS | Status: DC
  Filled 2012-09-12: qty 4

## 2012-09-12 MED ORDER — SODIUM CHLORIDE 0.9 % IV SOLN
INTRAVENOUS | Status: DC
  Administered 2012-09-12: 100 mL/h via INTRAVENOUS
  Administered 2012-09-13: 09:00:00 via INTRAVENOUS

## 2012-09-12 MED ORDER — ALUM & MAG HYDROXIDE-SIMETH 200-200-20 MG/5ML PO SUSP
30.0000 mL | Freq: Two times a day (BID) | ORAL | Status: DC | PRN
  Administered 2012-09-12: 30 mL via ORAL
  Filled 2012-09-12: qty 30

## 2012-09-12 MED ORDER — IOHEXOL 350 MG/ML SOLN
100.0000 mL | Freq: Once | INTRAVENOUS | Status: AC | PRN
  Administered 2012-09-12: 100 mL via INTRAVENOUS

## 2012-09-12 MED ORDER — SODIUM CHLORIDE 0.9 % IJ SOLN
3.0000 mL | Freq: Two times a day (BID) | INTRAMUSCULAR | Status: DC
  Administered 2012-09-12 – 2012-09-15 (×6): 3 mL via INTRAVENOUS

## 2012-09-12 MED ORDER — ONDANSETRON HCL 4 MG/2ML IJ SOLN: 4.0000 mg | Freq: Four times a day (QID) | INTRAMUSCULAR | Status: DC | PRN

## 2012-09-12 NOTE — ED Notes (Signed)
Heparin order cancelled per Dr. Ethelda Chick.

## 2012-09-12 NOTE — ED Provider Notes (Addendum)
History    CSN: [id number] Arrival date & time 09/12/12  4098  First MD Initiated Contact with Patient 09/12/12 0522     Chief Complaint  Patient presents with  . Chest Pain  . Abdominal Pain   level V caveat urgently for intervention (Consider location/radiation/quality/duration/timing/severity/associated sxs/prior Treatment) Patient is a 79 y.o. male presenting with chest pain and abdominal pain.  Chest Pain Associated symptoms: abdominal pain   Abdominal Pain Associated symptoms include chest pain and abdominal pain.    complains of anterior chest pain and also low abdominal pain onset 2 days ago. Pain is intermittent, pleuritic, at the center of his chest at the periumbilical area. Pain not made better or worse by anything. He treated himself with 3 sublingual nitroglycerin tonight with no relief. Upon EMS arrival patient found be hypotensive with a blood pressure  72 systolic. EMS treated patient with 4 baby aspirin to administered 750 mL IV bolus of normal saline. Presently discomfort as 6 on a scale of 1-10. Bowel movements normal. Nothing makes symptoms better or worse. Pain did not feel like "heart attack" he sat in the past which was "silent". Past Medical History  Diagnosis Date  . Arthritis   . Left leg weakness   . Right leg weakness   . Kidney stones 07-27-12    hx. kidney stones, at present left ureteral stone  . Hypertension   . Myocardial infarction     mild"no symptoms" was told 2 months ago  . Pigmented skin lesions 07-27-12    generalized, never any skin cancer dx.  . Edema of extremities     lower extremities,feet; compression socks worned   Past Surgical History  Procedure Laterality Date  . Cataract extraction, bilateral    . Wrist surgery      "nerve repair"  . Cystoscopy with retrograde pyelogram, ureteroscopy and stent placement Left 07/31/2012    Procedure: CYSTOSCOPY WITH LEFT RETROGRADE PYELOGRAM, LEFT URETEROSCOPY;  Surgeon: Garnett Farm, MD;   Location: [facility];  Service: Urology;  Laterality: Left;   No family history on file. History  Substance Use Topics  . Smoking status: Former Smoker    Types: Cigarettes    Quit date: 07/27/1973  . Smokeless tobacco: Not on file  . Alcohol Use: No    Review of Systems  Unable to perform ROS: Unstable vital signs  Constitutional: Negative.   HENT: Negative.   Respiratory: Negative.   Cardiovascular: Positive for chest pain.  Gastrointestinal: Positive for abdominal pain.  Musculoskeletal: Negative.   Skin: Negative.   Neurological: Negative.   Psychiatric/Behavioral: Negative.     Allergies  Review of patient's allergies indicates no known allergies.  Home Medications   Current Outpatient Rx  Name  Route  Sig  Dispense  Refill  . carvedilol (COREG) 3.125 MG tablet   Oral   Take 3.125 mg by mouth every morning.         Marland Kitchen HYDROcodone-acetaminophen (NORCO) 7.5-325 MG per tablet   Oral   Take 1 tablet by mouth every 4 (four) hours as needed for pain.   20 tablet   0   . lisinopril (PRINIVIL,ZESTRIL) 2.5 MG tablet   Oral   Take 2.5 mg by mouth every morning.         Marland Kitchen spironolactone (ALDACTONE) 50 MG tablet   Oral   Take 25 mg by mouth every morning.         . traMADol (ULTRAM) 50 MG tablet   Oral  Take 50 mg by mouth every 6 (six) hours as needed for pain.          BP 90/44  Pulse 70  Temp(Src) 97.9 F (36.6 C) (Oral)  Resp 20  SpO2 100% Physical Exam  Nursing note and vitals reviewed. Constitutional: He is oriented to person, place, and time. He appears distressed.  Chronically ill-appearing  HENT:  Head: Normocephalic and atraumatic.  Eyes: Conjunctivae are normal. Pupils are equal, round, and reactive to light.  Neck: Neck supple. No tracheal deviation present. No thyromegaly present.  Cardiovascular: Normal rate and regular rhythm.   No murmur heard. Pulmonary/Chest: Effort normal and breath sounds normal.  Abdominal: Soft. Bowel sounds  are normal. He exhibits no distension. There is tenderness.  Tender at periumbilical area  Genitourinary: Rectum normal and penis normal. Guaiac negative stool.  Normal scrotum rectal normal tone, grossly brown stool  Musculoskeletal: Normal range of motion. He exhibits no edema and no tenderness.  Neurological: He is alert and oriented to person, place, and time. Coordination normal.  Skin: Skin is warm and dry. No rash noted.  Psychiatric: He has a normal mood and affect.    ED Course  Procedures (including critical care time) Labs Reviewed  CBC  PROTIME-INR  TYPE AND SCREEN   No results found. No diagnosis found.  Date: 09/12/2012  Rate: 70  Rhythm: normal sinus rhythm  QRS Axis: normal  Intervals: normal  ST/T Wave abnormalities: ST elevations inferiorly and ST elevations laterally  Conduction Disutrbances:none  Narrative Interpretation:   Old EKG Reviewed: Inferior wall MI age indeterminate. With ST segment elevation new and in fear and lateral leads. Possibly sig just above acute MI as compared to 03/03/2002 interpreted by me  . Results for orders placed during the hospital encounter of 09/12/12  CBC      Result Value Range   WBC 11.8 (*) 4.0 - 10.5 K/uL   RBC 3.72 (*) 4.22 - 5.81 MIL/uL   Hemoglobin 11.3 (*) 13.0 - 17.0 g/dL   HCT 16.1 (*) 09.6 - 04.5 %   MCV 86.8  78.0 - 100.0 fL   MCH 30.4  26.0 - 34.0 pg   MCHC 35.0  30.0 - 36.0 g/dL   RDW 40.9  81.1 - 91.4 %   Platelets 179  150 - 400 K/uL  PROTIME-INR      Result Value Range   Prothrombin Time 13.2  11.6 - 15.2 seconds   INR 1.02  0.00 - 1.49  POCT I-STAT, CHEM 8      Result Value Range   Sodium 136  135 - 145 mEq/L   Potassium 4.2  3.5 - 5.1 mEq/L   Chloride 102  96 - 112 mEq/L   BUN 22  6 - 23 mg/dL   Creatinine, Ser 7.82 (*) 0.50 - 1.35 mg/dL   Glucose, Bld 956 (*) 70 - 99 mg/dL   Calcium, Ion 2.13  0.86 - 1.30 mmol/L   TCO2 25  0 - 100 mmol/L   Hemoglobin 10.9 (*) 13.0 - 17.0 g/dL   HCT 57.8 (*)  46.9 - 52.0 %  POCT I-STAT TROPONIN I      Result Value Range   Troponin i, poc 0.01  0.00 - 0.08 ng/mL   Comment 3            Dg Chest Port 1 View  09/12/2012   *RADIOLOGY REPORT*  Clinical Data: Chest pain.  Abdominal pain.  PORTABLE CHEST - 1 VIEW  Comparison:  07/31/2012.  Findings: Emphysema.  Clips project over the thoracic inlet and in the mediastinum.  No airspace disease.  No effusion.  Left basilar atelectasis.  The No pneumothorax.  Bilateral pleural apical scarring. Monitoring leads are projected over the chest.  The cardiopericardial silhouette is within normal limits.  IMPRESSION: Emphysema without acute cardiopulmonary disease.  No interval change.   Original Report Authenticated By: [name], M.D.    7:15 a.m. pain much improved after treatment with intravenous fluids and intravenous fentanyl. He has minimal pain at the end of deep inspiration. He appears more comfortable MDM  Code STEMI not called as patient has inferior wall MI age indeterminate with Q waves inferiorly. Also need to rule out aortic dissection or aneurysm in light of abdominal pain and hypotension. Cardiogenic shock also a possibility Strongly suspect acute coronary syndrome. Preliminary reading on CT and he of chest and abdomen negative for aortic dissection or aneurysm. Case discussed with Dr. Jacinto Halim who will arrange for admission Diagnosis #1 acute coronary syndrome #2 abdominal pain differential diagnosis includes bowel ischemia versus angina Diagnosis #3 hypotension #4 anemia #5 renal insufficiency CRITICAL CARE Performed by: Doug Sou Total critical care time: 30 minute Critical care time was exclusive of separately billable procedures and treating other patients. Critical care was necessary to treat or prevent imminent or life-threatening deterioration. Critical care was time spent personally by me on the following activities: development of treatment plan with patient and/or surrogate as well  as nursing, discussions with consultants, evaluation of patient's response to treatment, examination of patient, obtaining history from patient or surrogate, ordering and performing treatments and interventions, ordering and review of laboratory studies, ordering and review of radiographic studies, pulse oximetry and re-evaluation of patient's condition.  Doug Sou, MD 09/12/12 4166 Addendum 7:30 AM spoke with Dr. Jena Gauss who notified me that patient has a penetrating duodenal ulcer, seen on CT scan. Medical decision-making ulcer may be etiology of patient's pain. Heparin ordered discontinued.   Doug Sou, MD 09/13/12 [phone]

## 2012-09-12 NOTE — H&P (Signed)
[facility] History and Physical  [name] HYQ:[id number] DOB: 11-29-1925 DOA: 09/12/2012  Referring physician: Dr Ethelda Chick PCP: Pearson Grippe, MD  Specialists  Chief Complaint: chest and upper abdominal pain  HPI: Eduardo Salazar is a 79 y.o. male with PMH significant for CAD/ h/o MI, HTN, renal insufficiency who presents with c/o  Chest and upper abd pain x2days. He admits to associated nausea and vomiting - non bloody x 3episodes last pm. He denies diaphoresis and no parasthesias. He admits to taking aleves.  Pt states that he took NTG x 3 this am and called EMS and BP was 68/52 initially per EMS>> he was bolused with IVF and on arrival in ED BP was 92/56. In ED EKG revealed ST elevation inferiorly, troponin was neg and CT angio abdomen revealed Penetrating posterior pyloric channel ulcer with adjacent inflammation but without abscess or free air. CT angio of chest neg for PE. EDP consulted Dr Jacinto Halim and following eval he states pt's pain is non cardiac and EKG is unchanged from pt's prior EKGs at hisoffice, pt was also seen at his Office on 6/30. Recheck hgb in 10.9 in ED down from 13 in  May 2014. Hemoccult in EDP neg, and pt denies any melena or hematochezia. He is admitted to [facility] for further eval and management.   Review of Systems: The patient denies anorexia, fever, weight loss,, vision loss, decreased hearing, hoarseness, syncope, dyspnea on exertion, peripheral edema, balance deficits, hemoptysis, melena, hematochezia, severe , hematuria, incontinence, muscle weakness, transient blindness, difficulty walking, depression, unusual weight change, abnormal bleeding,   Past Medical History  Diagnosis Date  . Arthritis   . Left leg weakness   . Right leg weakness   . Kidney stones 07-27-12    hx. kidney stones, at present left ureteral stone  . Hypertension   . Myocardial infarction     mild"no symptoms" was told 2 months ago  . Pigmented skin lesions 07-27-12    generalized,  never any skin cancer dx.  . Edema of extremities     lower extremities,feet; compression socks worned   Past Surgical History  Procedure Laterality Date  . Cataract extraction, bilateral    . Wrist surgery      "nerve repair"  . Cystoscopy with retrograde pyelogram, ureteroscopy and stent placement Left 07/31/2012    Procedure: CYSTOSCOPY WITH LEFT RETROGRADE PYELOGRAM, LEFT URETEROSCOPY;  Surgeon: Garnett Farm, MD;  Location: [facility];  Service: Urology;  Laterality: Left;  . Cystoscopy w/ ureteral stent placement  02/2002    on left for ureteral stone   Social History:  reports that he quit smoking about 39 years ago. His smoking use included Cigarettes. He smoked 0.00 packs per day. He does not have any smokeless tobacco history on file. He reports that he does not drink alcohol or use illicit drugs.  where does patient live--home   No Known Allergies  Family history- His father died of an MI age 61, and his brother had open heart surgery(pt does not know for what reason)  Prior to Admission medications   Medication Sig Start Date End Date Taking? Authorizing Provider  aspirin EC 81 MG tablet Take 324 mg by mouth once.   Yes Historical Provider, MD  carvedilol (COREG) 3.125 MG tablet Take 3.125 mg by mouth every morning.   Yes Historical Provider, MD  lisinopril (PRINIVIL,ZESTRIL) 2.5 MG tablet Take 2.5 mg by mouth every morning.   Yes Historical Provider, MD  nitroGLYCERIN (NITROSTAT) 0.4 MG  SL tablet Place 0.4 mg under the tongue every 5 (five) minutes as needed for chest pain.   Yes Historical Provider, MD  spironolactone (ALDACTONE) 50 MG tablet Take 25 mg by mouth every morning.   Yes Historical Provider, MD  tamsulosin (FLOMAX) 0.4 MG CAPS Take 0.4 mg by mouth daily.   Yes Historical Provider, MD  traMADol (ULTRAM) 50 MG tablet Take 50 mg by mouth every 6 (six) hours as needed for pain.   Yes Historical Provider, MD   Physical Exam: Filed Vitals:   09/12/12 1610 09/12/12  0835 09/12/12 0853 09/12/12 0941  BP: 96/56 86/48 93/50  98/55  Pulse: 81 85 86 78  Temp:      TempSrc:      Resp: 18 18 18 18   Height: 5\' 7"  (1.702 m)     Weight: 67.2 kg (148 lb 2.4 oz)     SpO2: 99% 100% 100% 100%    Constitutional: Vital signs reviewed.  Patient is a well-developed and well-nourished  in no acute distress and cooperative with exam. Alert and oriented x3.  Head: Normocephalic and atraumatic Ear: TM normal bilaterally Nose: No erythema or drainage noted.  Mouth: no erythema or exudates, MMM Eyes: PERRL, EOMI, conjunctivae normal, No scleral icterus.  Neck: Supple, Trachea midline normal ROM, No JVD, mass, thyromegaly, or carotid bruit present.  Cardiovascular: RRR, S1 normal, S2 normal, no MRG, pulses symmetric and intact bilaterally Pulmonary/Chest: normal respiratory effort, CTAB, no wheezes, rales, or rhonchi Abdominal: Soft. LUQ down to periumbilical tenderness with guarding present, non-distended, no rebound tnderness, bowel sounds are normal, no masses, organomegaly. GU: no CVA tenderness Extremities: No cyanosis and no edema Neurological: A&O x3, Strength is normal and symmetric bilaterally, cranial nerve II-XII are grossly intact, no focal motor deficit, sensory intact to light touch bilaterally.  Skin: Warm, dry and intact. No rash.  Psychiatric: Normal mood and affect. speech and behavior is normal. Judgment and thought content normal.     Labs on Admission:  Basic Metabolic Panel:  Recent Labs Lab 09/12/12 0542  NA 136  K 4.2  CL 102  GLUCOSE 121*  BUN 22  CREATININE 1.50*   Liver Function Tests: No results found for this basename: AST, ALT, ALKPHOS, BILITOT, PROT, ALBUMIN,  in the last 168 hours No results found for this basename: LIPASE, AMYLASE,  in the last 168 hours No results found for this basename: AMMONIA,  in the last 168 hours CBC:  Recent Labs Lab 09/12/12 0527 09/12/12 0542  WBC 11.8*  --   HGB 11.3* 10.9*  HCT 32.3* 32.0*   MCV 86.8  --   PLT 179  --    Cardiac Enzymes: No results found for this basename: CKTOTAL, CKMB, CKMBINDEX, TROPONINI,  in the last 168 hours  BNP (last 3 results) No results found for this basename: PROBNP,  in the last 8760 hours CBG: No results found for this basename: GLUCAP,  in the last 168 hours  Radiological Exams on Admission: Ct Angio Chest Pe W/cm &/or Wo Cm  09/12/2012   *RADIOLOGY REPORT*  Clinical Data: Epigastric pain and back pain and chest pressure. Nausea.  CT ANGIOGRAPHY CHEST  Technique:  Multidetector CT imaging of the chest using the standard protocol during bolus administration of intravenous contrast. Multiplanar reconstructed images including MIPs were obtained and reviewed to evaluate the vascular anatomy.  Contrast: OMNIPAQUE IOHEXOL 350 MG/ML SOLN  Comparison: CT scan of the abdomen pelvis dated 07/21/2012  Findings: The patient has a penetrating  posterior pyloric channel ulcer best seen on image number 137 of series 6.  There is new fluid surrounding the duodenal bulb and pylorus.  There is no free air.  There is no evidence of aortic dissection.  The patient does have moderate coronary artery calcification.  The heart size is normal.  No hilar or mediastinal adenopathy.  The lungs are clear.  No effusions.  No acute osseous abnormalities.  No pulmonary emboli.  IMPRESSION:  1.  Negative CT angiogram of the chest. 2.  Penetrating posterior pyloric channel ulcer.   Original Report Authenticated By: [name], M.D.   Dg Chest Port 1 View  09/12/2012   *RADIOLOGY REPORT*  Clinical Data: Chest pain.  Abdominal pain.  PORTABLE CHEST - 1 VIEW  Comparison: 07/31/2012.  Findings: Emphysema.  Clips project over the thoracic inlet and in the mediastinum.  No airspace disease.  No effusion.  Left basilar atelectasis.  The No pneumothorax.  Bilateral pleural apical scarring. Monitoring leads are projected over the chest.  The cardiopericardial silhouette is within normal  limits.  IMPRESSION: Emphysema without acute cardiopulmonary disease.  No interval change.   Original Report Authenticated By: [name], M.D.   Ct Angio Abd/pel W/ And/or W/o  09/12/2012   *RADIOLOGY REPORT*  Clinical Data: Epigastric and back pain with nausea.  Chest pressure.  CT ANGIOGRAPHY ABDOMEN AND PELVIS WITH CONTRAST AND WITHOUT CONTRAST  Comparison: CT scan of the abdomen and pelvis dated 07/21/2012  Findings: The patient has what appears to be a posterior penetrating pyloric channel ulcer best seen on image number 137 of series 6.  There is new fluid surrounding the distal antrum and pyloric channel and proximal duodenal bulb.  No free air.  The abdominal aorta demonstrates focal areas of calcification and atheromatous disease but there is no dissection.  The origins of the celiac and superior mesenteric arteries are widely patent.  The inferior mesenteric artery and renal arteries are patent.  There are multiple tiny stones in the gallbladder.  No bile duct dilatation.  Liver parenchyma, spleen, pancreas and adrenal glands are normal.  There is an 8 mm stone in the lower pole of the left kidney as well as a 2 mm stone in the lower pole.  There is abnormal decreased perfusion to the left kidney with chronic marked hydronephrosis.  The left ureter is markedly dilated to the level of the bladder.  There is a small stone in a posterior calix of the mid left kidney, unchanged.  There is a tiny stone in the dilated distal left ureter but this is not obstructive.  There appears be an abnormally high insertion of the left ureter in the bladder.  The prostate gland is enlarged with increased enhancement in the periphery of the right lobe with multiple calcifications.  There are scattered diverticula in the left side of the colon. No diverticulitis. The terminal ileum and appendix are normal.  The bladder is distended to the level of the umbilicus.  No acute osseous abnormalities.  IMPRESSION: 1.  Penetrating posterior pyloric channel ulcer with adjacent inflammation but without abscess or free air.  2. Chronic hydronephrosis of the left kidney. Tiny stone in the distal left ureter, nonobstructive.  3. Enlarged asymmetrically enhancing prostate gland.  4. Bilateral renal calculi.   Original Report Authenticated By: [name], M.D.    EKG: Independently reviewed. ST elevation inferiorly, per Dr Jacinto Halim unchanged from last EKG  Assessment/Plan Active Problems:  Penetrating Posterior pyloric channel ulcer- with inflammation but  no free air of abcess -As discussed above, likely due to NSAID use - will place on PPI, I have consulted GI and [place] to see for further recommendations Anemia  - Hgb down to 10.9 from 13 in May but hemoccult neg -serial hh, follow trnsfuse as appropriate -GI consulted as above for further recs  Hypotension -meds vs cardiac vs above  -s/p NTG x3 this am - will hold all antihypertensives, fluid resuscitation - cycle cardiac enzymes and follow -serial hgb an transfuse accordingly - also check lactic, procalctonin, UA and culture: CT angio without evidence of infection and pt afebrile at this time   Abnormal EKG/CAD/ H/O MI - per Cards as above unchanged and pain noncardiac at this time -cycle CEs and follow - holding off ASA, B blocker and ACE due to hypotension   Chronic renal insufficiency -stable, follow and recheck -Per CT abd chronic L. Hydronephrosis and non obstructing bil renal calculi.   Code Status: FULL  Family Communication:family at bedside Disposition Plan: Admit to step down  Time spent: >68mins  Kela Millin [facility] Pager [phone]  If 7PM-7AM, please contact night-coverage [email or web address] Password [email or web address] 09/12/2012, 11:09 AM

## 2012-09-12 NOTE — Progress Notes (Signed)
Patient with c/o  epigastric pain.  Pt says pain similar to what he has had in past. Pt feels pain is indigetsion like.  Maalox given prn for indigestion.  Pt also received morphine 1 mg prn for pain prior to receiving maalox order. Dr Suanne Marker notified of indigestion like pain.  Will continue to monitor.

## 2012-09-12 NOTE — Care Management Note (Signed)
    Page 1 of 1   09/12/2012     12:26:10 PM   CARE MANAGEMENT NOTE 09/12/2012  Patient:  Eduardo Salazar, Eduardo Salazar   Account Number:  [id number]  Date Initiated:  09/12/2012  Documentation initiated by:  Junius Creamer  Subjective/Objective Assessment:   adm w ch pain     Action/Plan:   lives w wife, pcp dr Fayrene Fearing kim   Anticipated DC Date:     Anticipated DC Plan:        DC Planning Services  CM consult      Choice offered to / List presented to:             Status of service:   Medicare Important Message given?   (If response is "NO", the following Medicare IM given date fields will be blank) Date Medicare IM given:   Date Additional Medicare IM given:    Discharge Disposition:    Per UR Regulation:  Reviewed for med. necessity/level of care/duration of stay  If discussed at Long Length of Stay Meetings, dates discussed:    Comments:

## 2012-09-12 NOTE — ED Notes (Signed)
Family at bedside. 

## 2012-09-12 NOTE — Consult Note (Signed)
[facility] Consult: 11:52 AM 09/12/2012   Referring Provider: Dr Sharon Seller  Primary Care Physician:  Pearson Grippe, MD Primary Gastroenterologist:  Dr. Sabino Gasser  Reason for Consultation:  Pyloric channel ulcer  HPI: Eduardo Salazar is a 79 y.o. male.  Hx of ureteral stones.  On 07/31/2012 underwent cystoscopy and stone extraction from left ureter.  Hx of adenomatous colon polyp and diverticulosis on colonoscopy in 2004, no recurrent polyps in 2006.  Dr Jacinto Halim notes remote hx PUD, duodenitis but pt has no recall of this and is not on PPI.   Has been under care of Dr Jacinto Halim for LE edema.  With diuretics, swelling has gone down and weight went from 155 06/13/12 to 138 # yesterday.  Wife says pt's echocardiogram "found nothing"  Yesterday AM had lower to mid chest pain and non-bloody emesis.  Seen in office yesterday by Dr Jacinto Halim who cleared him from cardiac standpoint and added prn SL NTG.  Felt better last night but recurrent chest pain early this AM.  This did not improve after SL NTG. Came to ED where CT of chest and abdomen show he has a penetrating, but not perforating, ulcer in pyloric channel. GI ROS pretty unremarkable except for above sxs and constipation.  He eats well, ate well last night. No dysphagia. No blood in stool.  Last BMs were 6/28 and 6/29 but he strains to have formed BMs.   Takes Tramadol for arthritis pain once daily.  Has not used Aleve or any other NSID except 81 ASA, since January/February of 2014.  Wife admits pt has poor memory and she worries about his driving.  He gets confused when attempting to use cell phone she gives for him to use when he leaves home.  Has not had formal dementia evaluation.     Past Medical History  Diagnosis Date  . Arthritis   . Kidney stones 07-27-12    hx. kidney stones, at present left ureteral stone  . Hypertension   . Myocardial infarction     mild"no symptoms" was told 2 months ago  .  Pigmented skin lesions 07-27-12    generalized, never any skin cancer dx.  . Edema of extremities     lower extremities,feet; compression socks worned    Past Surgical History  Procedure Laterality Date  . Cataract extraction, bilateral    . Wrist surgery      "nerve repair"  . Cystoscopy with retrograde pyelogram, ureteroscopy and stent placement Left 07/31/2012    Procedure: CYSTOSCOPY WITH LEFT RETROGRADE PYELOGRAM, LEFT URETEROSCOPY;  Surgeon: Garnett Farm, MD;  Location: [facility];  Service: Urology;  Laterality: Left;  . Cystoscopy w/ ureteral stent placement  02/2002    on left for ureteral stone    Prior to Admission medications   Medication Sig Start Date End Date Taking? Authorizing Provider  aspirin EC 81 MG tablet Take 324 mg by mouth once.   Yes Historical Provider, MD  carvedilol (COREG) 3.125 MG tablet Take 3.125 mg by mouth every morning.   Yes Historical Provider, MD  lisinopril (PRINIVIL,ZESTRIL) 2.5 MG tablet Take 2.5 mg by mouth every morning.   Yes Historical Provider, MD  nitroGLYCERIN (NITROSTAT) 0.4 MG SL tablet Place 0.4 mg under the tongue every 5 (five) minutes as needed for chest pain.   Yes Historical Provider, MD  spironolactone (ALDACTONE) 50 MG tablet Take 25 mg by mouth every morning.   Yes Historical Provider, MD  tamsulosin (FLOMAX) 0.4 MG CAPS Take 0.4  mg by mouth daily.   Yes Historical Provider, MD  traMADol (ULTRAM) 50 MG tablet Take 50 mg by mouth every 6 (six) hours as needed for pain.   Yes Historical Provider, MD    Scheduled Meds: . norepinephrine (LEVOPHED) Adult infusion  5 mcg/min Intravenous Once   Infusions: . dextrose 5 % and 0.45% NaCl 150 mL/hr at 09/12/12 0820   PRN Meds:    Allergies as of 09/12/2012  . (No Known Allergies)    No family history on file.  History   Social History  . Marital Status: Married    Spouse Name: N/A    Number of Children: N/A  . Years of Education: N/A   Occupational History  . 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    Social History Main Topics  . Smoking status: Former Smoker    Types: Cigarettes    Quit date: 07/27/1973  . Smokeless tobacco: Not on file  . Alcohol Use: No  . Drug Use: No  . Sexually Active: Not Currently   Other Topics Concern  . Not on file   Social History Narrative  . He and his wife live with his step-daughter/wife's daughter at her home.     REVIEW OF SYSTEMS: Constitutional:  Per HPI.  Wife says he is generally weak and unsteady, no big changes to this chronic issue ENT:  No nose bleeds Pulm:  No new SOB/dyspnea or cough CV:  No palpitatiions.  Improved LE edema and dropped weight in last several weeks. Following low sodium diet.  GU:  Occasional urinary incontinence GI:  Per HPI.  No choking on pills or food Heme:  No problems with blood counts.    Transfusions:  None in past Neuro:  Confusion.  Unsteady gait for many years. Uses 4 pronged cane and has not had a fall since 01/2013 to wife's knowledge. Derm:  Has been told he has ringworm on his scalp, treats this with topical Lamisil.  He still scratches this area and has chronic scap on front scalp Endocrine:  No excessive thirst.  Wife has to encourage him to drink fluids Immunization:  Not queried.  Travel:  none   PHYSICAL EXAM: Vital signs in last 24 hours: Temp:  [97.9 F (36.6 C)] 97.9 F (36.6 C) (07/01 0506) Pulse Rate:  [70-86] 78 (07/01 1130) Resp:  [16-20] 18 (07/01 1130) BP: (86-138)/(44-118) 111/60 mmHg (07/01 1130) SpO2:  [99 %-100 %] 99 % (07/01 1130) Weight:  [67.2 kg (148 lb 2.4 oz)] 67.2 kg (148 lb 2.4 oz) (07/01 0713)  General: aged appearing WM who is comfortable and is hungry.  No distress Head:  Scab on left scalp in front.  No facial asymmetry or swelling  Eyes:  No icterus or pallor Ears:  Slightly HOH  Nose:  No discharge or congestion Mouth:  Moist, clear oral MM Neck:  No JVD, no bruits, no TMG Lungs:  Clear  bil.  No cough, no dyspnea Heart: RRR.  No MRG. Abdomen:  Soft, not tender or distended.  No mass, no HSM, no bruits.   Rectal: deferred   Musc/Skeltl: no joint swellling Extremities:  Minor pitting edema in feet bil.   Neurologic:  Oriented to self , hospital but not precise on date.  Poor memory as to medical history.  Very alert, no tremor.  No limb weakness Skin:  Actinic keratosis on scalp, scap on scalp Tattoos:  none Nodes:  No  cervical adenopathy   Psych:  Pleasant, cooperative, not agitated.   Intake/Output from previous day:   Intake/Output this shift: Total I/O In: 1000 [I.V.:1000] Out: -   LAB RESULTS:  Recent Labs  09/12/12 0527 09/12/12 0542  WBC 11.8*  --   HGB 11.3* 10.9*  HCT 32.3* 32.0*  PLT 179  --   MCV    86.8  BMET Lab Results  Component Value Date   NA 136 09/12/2012   NA 135 07/27/2012   NA 133* 01/16/2012   K 4.2 09/12/2012   K 5.0 07/27/2012   K 4.5 01/16/2012   CL 102 09/12/2012   CL 100 07/27/2012   CL 102 01/16/2012   CO2 26 07/27/2012   CO2 23 01/16/2012   CO2 26 11/21/2006   GLUCOSE 121* 09/12/2012   GLUCOSE 100* 07/27/2012   GLUCOSE 115* 01/16/2012   BUN 22 09/12/2012   BUN 23 07/27/2012   BUN 17 01/16/2012   CREATININE 1.50* 09/12/2012   CREATININE 1.60* 07/27/2012   CREATININE 1.37* 01/16/2012   CALCIUM 9.6 07/27/2012   CALCIUM 9.3 01/16/2012   CALCIUM 8.8 11/21/2006   LFT No results found for this basename: PROT, ALBUMIN, AST, ALT, ALKPHOS, BILITOT, BILIDIR, IBILI,  in the last 72 hours PT/INR Lab Results  Component Value Date   INR 1.02 09/12/2012     RADIOLOGY STUDIES: Ct Angio Chest Pe W/cm &/or Wo Cm 09/12/2012     Findings: The patient has a penetrating posterior pyloric channel ulcer best seen on image number 137 of series 6.  There is new fluid surrounding the duodenal bulb and pylorus.  There is no free air.  There is no evidence of aortic dissection.  The patient does have moderate coronary artery calcification.  The heart size is normal.   No hilar or mediastinal adenopathy.  The lungs are clear.  No effusions.  No acute osseous abnormalities.  No pulmonary emboli.  IMPRESSION:  1.  Negative CT angiogram of the chest. 2.  Penetrating posterior pyloric channel ulcer.   Original Report Authenticated By: [name], M.D.   Dg Chest [facility] 09/12/2012  .  Findings: Emphysema.  Clips project over the thoracic inlet and in the mediastinum.  No airspace disease.  No effusion.  Left basilar atelectasis.  The No pneumothorax.  Bilateral pleural apical scarring. Monitoring leads are projected over the chest.  The cardiopericardial silhouette is within normal limits.  IMPRESSION: Emphysema without acute cardiopulmonary disease.  No interval change.   Original Report Authenticated By: [name], M.D.   Ct Angio Abd/pel W/ And/or W/o 09/12/2012   Comparison: CT scan of the abdomen and pelvis dated 07/21/2012  Findings: The patient has what appears to be a posterior penetrating pyloric channel ulcer best seen on image number 137 of series 6.  There is new fluid surrounding the distal antrum and pyloric channel and proximal duodenal bulb.  No free air.  The abdominal aorta demonstrates focal areas of calcification and atheromatous disease but there is no dissection.  The origins of the celiac and superior mesenteric arteries are widely patent.  The inferior mesenteric artery and renal arteries are patent.  There are multiple tiny stones in the gallbladder.  No bile duct dilatation.  Liver parenchyma, spleen, pancreas and adrenal glands are normal.  There is an 8 mm stone in the lower pole of the left kidney as well as a 2 mm stone in the lower pole.  There is abnormal decreased perfusion to the  left kidney with chronic marked hydronephrosis.  The left ureter is markedly dilated to the level of the bladder.  There is a small stone in a posterior calix of the mid left kidney, unchanged.  There is a tiny stone in the dilated distal left ureter but this is  not obstructive.  There appears be an abnormally high insertion of the left ureter in the bladder.  The prostate gland is enlarged with increased enhancement in the periphery of the right lobe with multiple calcifications.  There are scattered diverticula in the left side of the colon. No diverticulitis. The terminal ileum and appendix are normal.  The bladder is distended to the level of the umbilicus.  No acute osseous abnormalities.  IMPRESSION: 1. Penetrating posterior pyloric channel ulcer with adjacent inflammation but without abscess or free air.  2. Chronic hydronephrosis of the left kidney. Tiny stone in the distal left ureter, nonobstructive.  3. Enlarged asymmetrically enhancing prostate gland.  4. Bilateral renal calculi.   Original Report Authenticated By: [name], M.D.    ENDOSCOPIC STUDIES:  11/2002 Colonoscopy for hx of colon polyps FINDINGS:  1. Diverticulosis significant of the sigmoid colon.  2. Small polyp in the cecum. Pathology: ENDOSCOPIC BIOPSY, CECAL COLON: ADENOMATOUS POLYP(S). NO HIGH GRADE DYSPLASIA OR INVASIVE MALIGNANCY IDENTIFIED.  02/2005  Colonoscopy for hx of polyps FINDINGS: Diverticulosis of sigmoid colon, internal hemorrhoids otherwise  unremarkable examination.    IMPRESSION: *  Symptomatic ulcer in pyloric channel.  Although "penetrating" at time of CT scan there is no evidence of perforation or abscess formation. May have lement of GOO given the recent N/V *  Chronic ureterolithiasis.  Chronic left hydronephrosis. S/p cysto and stone extraction 07/31/2012 *  Chronic kidney disease.  *  Glucose intolerance.  *  Hyponatremia, mild *  Hx adenomatous colon polyps.   PLAN: *  BID IV Protonix *  Check stool H Pylori Ab. I ordered this *  Start full liquids, as discussed with Dr Rhea Belton.  i ordered this. *  No EGD today as do not want to cause perforation.  Timing of EGD per Dr Rhea Belton.    LOS: 0 days   Jennye Moccasin  09/12/2012, 11:52 AM Pager:  [phone]

## 2012-09-12 NOTE — ED Notes (Signed)
Admitting PA at the bedside.

## 2012-09-12 NOTE — ED Notes (Signed)
Dr. Jacinto Halim paged regarding hypotension. Levophed drip to be started at 28mcg/min.

## 2012-09-12 NOTE — Consult Note (Signed)
Patient seen, examined, and I agree with the above documentation, including the assessment and plan. Pt with signs and symptoms of PUD, imaging concerning for pyloric channel ulcer.  Agree with exclusion of H pylori.  No recent NSAIDs other than asa for prophylaxis. BID IV PPI Will add sucralfate. Will hold off on EGD today and observe.  Ulcer is penetrating which carries risk of perforation.  Benign abd exam now. No evidence for acute GI bleeding. Full liquids EGD recommended before d/c to confirm dx and exclude malignancy

## 2012-09-12 NOTE — ED Notes (Signed)
Patient transported to CT. Remains in ct.

## 2012-09-12 NOTE — Progress Notes (Signed)
CRITICAL VALUE ALERT  Critical value received:  Trop 0.39  Date of notification:  09/12/12  Time of notification:  1957  Critical value read back:yes  Nurse who received alert:  Me  MD notified (1st page): Jacinto Halim  Time of first page: 2005  MD notified (2nd page):  Time of second page:  Responding MD:  Jacinto Halim  Time MD responded:  2012

## 2012-09-12 NOTE — ED Notes (Signed)
BP stable at the time. Levophed on hold.

## 2012-09-12 NOTE — ED Notes (Signed)
Pt resting quietly at the time. States 5/10 chest pain with inspiration. Family at bedside. Vital signs stable.

## 2012-09-12 NOTE — Consult Note (Addendum)
CARDIOLOGY CONSULT NOTE  Patient ID: Eduardo Salazar MRN: [id number] DOB/AGE: 11/28/1925 79 y.o.  Admit date: 09/12/2012 Referring Physician  Doug Sou, MD Primary Physician:  Pearson Grippe, MD Reason for Consultation  Chest pain, abnormal EKG  HPI:  Mr.Eduardo Salazar is a 79 year old Caucasian male with history of borderline diabetes, history of peptic ulcer disease with duodenitis in the remote past, history of reflex sympathetic dystrophy right arm status post cervical sympathectomy in the remote past. History of recent ureteric stone, stone excision due to hematuria.  His wife has noticed weight loss of 3 pounds over the past 2-3 days, and also patient complained of chest pain 2 nights ago and had seen the following day and felt as there was no recurrence, no changes were done, I did add sublingual nitroglycerin and felt he was stable. Last night, he had severe abdominal discomfort, upper abdominal, lower chest pain, worsening pain on taking deep breath, very uncomfortable, took 3 sublingual nitroglycerin, no relief of chest pain or abdominal discomfort, called the EMS and presented to the emergency room. I was called to see the patient due to abnormal EKG and "chest pain". He is accompanied to his daughter at the bedside.  On questioning, patient states that he has had epigastric discomfort all night. He also had chest pain behind the sternum, and also the left part of the chest, stating that taking deep breath would hurt. He was not comfortable no matter how he laid down. There was no radiation of chest pain, no associated shortness of breath, no PND, although he had difficulty in laying down and sleeping comfortably through the whole night.  Patient has been compliant with his medications, follow through with fluid and salt restriction, he has done remarkably well compared to initial presentation when I first saw him and his since last about 15-20 pounds in weight and is maintained stable weight.   Presently continues to have discomfort in his abdomen, denies any bloody stools or dark stools. He also complains of mild dizziness and feeling very fatigued and weak. No fever, nausea, vomiting. He denies any symptoms to suggest TIA, claudication, recent weight change although he lost about 15 pounds of weight over the past 6 months after I started treating him for congestive heart failure, however in the past 3 months his weight has been stable.  Past Medical History  Diagnosis Date  . Arthritis   . Left leg weakness   . Right leg weakness   . Kidney stones 07-27-12    hx. kidney stones, at present left ureteral stone  . Hypertension   . Myocardial infarction     mild"no symptoms" was told 2 months ago  . Pigmented skin lesions 07-27-12    generalized, never any skin cancer dx.  . Edema of extremities     lower extremities,feet; compression socks worned     Past Surgical History  Procedure Laterality Date  . Cataract extraction, bilateral    . Wrist surgery      "nerve repair"  . Cystoscopy with retrograde pyelogram, ureteroscopy and stent placement Left 07/31/2012    Procedure: CYSTOSCOPY WITH LEFT RETROGRADE PYELOGRAM, LEFT URETEROSCOPY;  Surgeon: Garnett Farm, MD;  Location: [facility];  Service: Urology;  Laterality: Left;     No family history on file.   Social History: History   Social History  . Marital Status: Married    Spouse Name: N/A    Number of Children: N/A  . Years of Education: N/A   Occupational  History  . Not on file.   Social History Main Topics  . Smoking status: Former Smoker    Types: Cigarettes    Quit date: 07/27/1973  . Smokeless tobacco: Not on file  . Alcohol Use: No  . Drug Use: No  . Sexually Active: Not Currently   Other Topics Concern  . Not on file   Social History Narrative  . No narrative on file      (Not in a hospital admission)  Scheduled Meds: . norepinephrine (LEVOPHED) Adult infusion  5 mcg/min Intravenous Once    Continuous Infusions: . dextrose 5 % and 0.45% NaCl 150 mL/hr at 09/12/12 0820   PRN Meds:.  ROS: General: no fevers/chills/night sweats Eyes: no blurry vision, diplopia, or amaurosis ENT: no sore throat or hearing loss Resp: no cough, wheezing, or hemoptysis Other systems are negative.    Physical Exam: Blood pressure 98/55, pulse 78, temperature 97.9 F (36.6 C), temperature source Oral, resp. rate 18, height 5\' 7"  (1.702 m), weight 67.2 kg (148 lb 2.4 oz), SpO2 100.00%.   General appearance: alert, cooperative, appears older than stated age, mild distress and pale Lungs: clear to auscultation bilaterally Chest wall: no tenderness Heart: regular rate and rhythm, S1, S2 normal, no murmur, click, rub or gallop Abdomen: Diffuse abdominal tenderness present, mild guarding evident on the epigastric region, bowel sounds present, sluggish. No organomegaly. Extremities: edema 2 plus below knee, pitting and Homans sign is negative, no sign of DVT Pulses: 2+ and symmetric  Labs:   Lab Results  Component Value Date   WBC 11.8* 09/12/2012   HGB 10.9* 09/12/2012   HCT 32.0* 09/12/2012   MCV 86.8 09/12/2012   PLT 179 09/12/2012    Recent Labs Lab 09/12/12 0542  NA 136  K 4.2  CL 102  BUN 22  CREATININE 1.50*  GLUCOSE 121*   No results found for this basename: CKTOTAL, CKMB, CKMBINDEX, TROPONINI    Lipid Panel  No results found for this basename: chol, trig, hdl, cholhdl, vldl, ldlcalc    EKG: unchanged from previous tracings, normal sinus rhythm, inferior Q waves, nonspecific ST segment depression in inferior and lateral leads, cannot exclude ischemia. No significant change compared to prior EKG done on 09/11/2012 and 05/03/2012 in my office.    Radiology: Ct Angio Chest Pe W/cm &/or Wo Cm  09/12/2012   *RADIOLOGY REPORT*  Clinical Data: Epigastric pain and back pain and chest pressure. Nausea.  CT ANGIOGRAPHY CHEST  Technique:  Multidetector CT imaging of the chest using the  standard protocol during bolus administration of intravenous contrast. Multiplanar reconstructed images including MIPs were obtained and reviewed to evaluate the vascular anatomy.  Contrast: OMNIPAQUE IOHEXOL 350 MG/ML SOLN  Comparison: CT scan of the abdomen pelvis dated 07/21/2012  Findings: The patient has a penetrating posterior pyloric channel ulcer best seen on image number 137 of series 6.  There is new fluid surrounding the duodenal bulb and pylorus.  There is no free air.  There is no evidence of aortic dissection.  The patient does have moderate coronary artery calcification.  The heart size is normal.  No hilar or mediastinal adenopathy.  The lungs are clear.  No effusions.  No acute osseous abnormalities.  No pulmonary emboli.  IMPRESSION:  1.  Negative CT angiogram of the chest. 2.  Penetrating posterior pyloric channel ulcer.   Original Report Authenticated By: [name], M.D.   Dg Chest Port 1 View  09/12/2012   *RADIOLOGY REPORT*  Clinical Data: Chest pain.  Abdominal pain.  PORTABLE CHEST - 1 VIEW  Comparison: 07/31/2012.  Findings: Emphysema.  Clips project over the thoracic inlet and in the mediastinum.  No airspace disease.  No effusion.  Left basilar atelectasis.  The No pneumothorax.  Bilateral pleural apical scarring. Monitoring leads are projected over the chest.  The cardiopericardial silhouette is within normal limits.  IMPRESSION: Emphysema without acute cardiopulmonary disease.  No interval change.   Original Report Authenticated By: [name], M.D.   Ct Angio Abd/pel W/ And/or W/o  09/12/2012   *RADIOLOGY REPORT*  Clinical Data: Epigastric and back pain with nausea.  Chest pressure.  CT ANGIOGRAPHY ABDOMEN AND PELVIS WITH CONTRAST AND WITHOUT CONTRAST  Comparison: CT scan of the abdomen and pelvis dated 07/21/2012  Findings: The patient has what appears to be a posterior penetrating pyloric channel ulcer best seen on image number 137 of series 6.  There is new fluid  surrounding the distal antrum and pyloric channel and proximal duodenal bulb.  No free air.  The abdominal aorta demonstrates focal areas of calcification and atheromatous disease but there is no dissection.  The origins of the celiac and superior mesenteric arteries are widely patent.  The inferior mesenteric artery and renal arteries are patent.  There are multiple tiny stones in the gallbladder.  No bile duct dilatation.  Liver parenchyma, spleen, pancreas and adrenal glands are normal.  There is an 8 mm stone in the lower pole of the left kidney as well as a 2 mm stone in the lower pole.  There is abnormal decreased perfusion to the left kidney with chronic marked hydronephrosis.  The left ureter is markedly dilated to the level of the bladder.  There is a small stone in a posterior calix of the mid left kidney, unchanged.  There is a tiny stone in the dilated distal left ureter but this is not obstructive.  There appears be an abnormally high insertion of the left ureter in the bladder.  The prostate gland is enlarged with increased enhancement in the periphery of the right lobe with multiple calcifications.  There are scattered diverticula in the left side of the colon. No diverticulitis. The terminal ileum and appendix are normal.  The bladder is distended to the level of the umbilicus.  No acute osseous abnormalities.  IMPRESSION: 1. Penetrating posterior pyloric channel ulcer with adjacent inflammation but without abscess or free air.  2. Chronic hydronephrosis of the left kidney. Tiny stone in the distal left ureter, nonobstructive.  3. Enlarged asymmetrically enhancing prostate gland.  4. Bilateral renal calculi.   Original Report Authenticated By: [name], M.D.    ASSESSMENT AND PLAN:  1. Penetrating duodenal ulcer with surrounding inflammation 2. Lower chest pain/upper abdominal discomfort, tenderness in the epigastrium and diffusely throughout the abdomen, suggest acute GI issue 3. History  of peptic ulcer disease in the past, drop in hemoglobin recently, on file/50/2014 hemoglobin was 13.0, hemoglobin today was 11.8. 4. Abnormal EKG, suggest old inferior wall myocardial infarction. This is unchanged from prior EKG. Please see above. Outpatient Lexiscan stress 05/16/2012: Resting EKG NSR, Poor R wave progression. Inferior and lateral T inversion consider subendocardial infarct vs ischemia. Stress EKG was non diagnostic for ischemia. Stress terminated due to completion of protocol. Perfusion imaging study demonstrates a large sized inferior and inferolateral wall transmural myocardial infarction with very mild peri-infarct ischemia. RAW images reveal no significant attenuation artifact. The left ventricle cavity is mildly dilated with the left ventricular end-diastolic  volume of 159 mL. Left ventricle systolic function Limited by QGS was markedly reduced at 32%. This represents a low risk study.  Outpatient Echo 05/25/12 Left ventricular cavity is normal in size. Mild concentric hypertrophy. Normal global wall motion. Lower limits systolic global function. Visual EF is 50-55%. Grade 1 Diastolic Dysfunction. Left atrial cavity is mildly dilated. Mild aortic regurgitation, Mild mitral regurgitation. Mild tricuspid regurgitation no pulmonary hypertension. 5. Renal stones: Chronic hydronephrosis of the left kidney. Tiny stone in the distal left ureter, nonobstructive.   Recommendation: I have communicated to the ED physician, that this is clearly a noncardiac chest pain and further evaluation including evaluation either by GI or internal medicine need to be considered. I be happy to admit him in case it is felt to be non-GI related or non-medicine related admission. Although he has had chest pain all night, there is no troponin leak, no EKG changes that are evident compared to prior EKG. Patient's initial hypotension was related to use of sublingual nitroglycerin x3, without any relief of chest pain or  abdominal discomfort. I suspect acute duodenitis to be the etiology for his presentation. I be happy to continue to follow the patient.  Pamella Pert, MD 09/12/2012, 10:46 AM [facility] [place] Pager: [phone] Office: [phone] If no answer Cell [phone]

## 2012-09-12 NOTE — ED Notes (Signed)
Patient with history of chest pressure and epigastric pain for last two days.  Patient is having nausea, no vomiting, belching.  Pain is described as a pressure, worse with palpation.  Patient took 3 SL nitro PTA of EMS and BP found to be 68/52.  Patient also took 324mg  ASA PTA of EMS.  BP at this time is 92/56.  Pressure continues as a 6/10.  Patient does have some weakness associated with his pressure.

## 2012-09-12 NOTE — Progress Notes (Signed)
ANTICOAGULATION CONSULT NOTE - Initial Consult  Pharmacy Consult for heparin  Indication: chest pain/ACS  No Known Allergies  Patient Measurements: Height: 5\' 7"  (170.2 cm) Weight: 148 lb 2.4 oz (67.2 kg) (Per 07/27/12 documentation) IBW/kg (Calculated) : 66.1 Heparin Dosing Weight: 67 kg  Vital Signs: Temp: 97.9 F (36.6 C) (07/01 0506) Temp src: Oral (07/01 0506) BP: 96/56 mmHg (07/01 0713) Pulse Rate: 81 (07/01 0713)  Labs:  Recent Labs  09/12/12 0527 09/12/12 0542  HGB 11.3* 10.9*  HCT 32.3* 32.0*  PLT 179  --   LABPROT 13.2  --   INR 1.02  --   CREATININE  --  1.50*    Estimated Creatinine Clearance: 34.3 ml/min (by C-G formula based on Cr of 1.5).   Medical History: Past Medical History  Diagnosis Date  . Arthritis   . Left leg weakness   . Right leg weakness   . Kidney stones 07-27-12    hx. kidney stones, at present left ureteral stone  . Hypertension   . Myocardial infarction     mild"no symptoms" was told 2 months ago  . Pigmented skin lesions 07-27-12    generalized, never any skin cancer dx.  . Edema of extremities     lower extremities,feet; compression socks worned   Medications:  Scheduled:    Assessment: 79 yo male presented with chest pain. Pharmacy to manage IV heparin.   Goal of Therapy:  Heparin level 0.3-0.7 units/ml Monitor platelets by anticoagulation protocol: Yes   Plan:  1. Heparin 4000 unit IV bolus x 1, then IV infusion 800 units/hr.  2. Heparin level in 8 hours 3. Daily CBC, heparin level   Emeline Gins 09/12/2012,7:17 AM

## 2012-09-13 DIAGNOSIS — I200 Unstable angina: Secondary | ICD-10-CM

## 2012-09-13 LAB — CBC
Hemoglobin: 9.9 g/dL — ABNORMAL LOW (ref 13.0–17.0)
Platelets: 133 10*3/uL — ABNORMAL LOW (ref 150–400)
RBC: 3.28 MIL/uL — ABNORMAL LOW (ref 4.22–5.81)

## 2012-09-13 LAB — BASIC METABOLIC PANEL
Calcium: 8 mg/dL — ABNORMAL LOW (ref 8.4–10.5)
GFR calc Af Amer: 63 mL/min — ABNORMAL LOW (ref >90)
GFR calc non Af Amer: 55 mL/min — ABNORMAL LOW (ref >90)
Potassium: 4 mEq/L (ref 3.5–5.1)
Sodium: 135 mEq/L (ref 135–145)

## 2012-09-13 LAB — TROPONIN I: Troponin I: 0.69 ng/mL (ref <0.30)

## 2012-09-13 MED ORDER — SODIUM CHLORIDE 0.9 % IV SOLN
INTRAVENOUS | Status: DC
  Administered 2012-09-13: 23:00:00 via INTRAVENOUS

## 2012-09-13 MED ORDER — POLYETHYLENE GLYCOL 3350 17 G PO PACK
17.0000 g | PACK | Freq: Every day | ORAL | Status: DC | PRN
  Administered 2012-09-13 – 2012-09-15 (×3): 17 g via ORAL
  Filled 2012-09-13 (×4): qty 1

## 2012-09-13 NOTE — Progress Notes (Addendum)
Subjective:  Patient complains of chest pain on swallowing and eating food. States that his abdominal discomfort has pretty much resolved. Slept well through the night without any problems. No desaturation evident. Did have soft blood pressures during the night. Good urine output.  Objective:  Vital Signs in the last 24 hours: Temp:  [97.8 F (36.6 C)-98.5 F (36.9 C)] 98.1 F (36.7 C) (07/02 0730) Pulse Rate:  [55-86] 57 (07/02 0730) Resp:  [8-21] 12 (07/02 0730) BP: (72-140)/(27-79) 101/39 mmHg (07/02 0730) SpO2:  [97 %-100 %] 100 % (07/02 0730) Weight:  [66 kg (145 lb 8.1 oz)] 66 kg (145 lb 8.1 oz) (07/01 1200)  Intake/Output from previous day: 07/01 0701 - 07/02 0700 In: 3684.7 [P.O.:240; I.V.:3444.7] Out: -   Physical Exam: General appearance: alert, cooperative, appears older than stated age, mild distress and pale  Lungs: clear to auscultation bilaterally  Chest wall: no tenderness  Heart: regular rate and rhythm, S1, S2 normal, no murmur, click, rub or gallop  Abdomen: Mild guarding present, no tenderness, bowel sounds heard in all 4 quadrants. Extremities: edema 1-2 plus below knee, pitting and Homans sign is negative, no sign of DVT  Pulses: 2+ and symmetric    Lab Results:  Recent Labs  09/12/12 0527  09/12/12 1845 09/13/12 0052  WBC 11.8*  --   --   --   HGB 11.3*  < > 10.7* 9.6*  PLT 179  --   --   --   < > = values in this interval not displayed.  Recent Labs  09/12/12 0542  NA 136  K 4.2  CL 102  GLUCOSE 121*  BUN 22  CREATININE 1.50*    Recent Labs  09/12/12 1842 09/13/12 0052  TROPONINI 0.39* 0.69*   Scheduled Meds: . pantoprazole (PROTONIX) IV  40 mg Intravenous Q12H  . sodium chloride  3 mL Intravenous Q12H  . sucralfate  1 g Oral Q6H  . tamsulosin  0.4 mg Oral Daily   Continuous Infusions: . sodium chloride 100 mL/hr (09/12/12 1205)   PRN Meds:.acetaminophen, acetaminophen, alum & mag hydroxide-simeth, morphine injection,  nitroGLYCERIN, ondansetron (ZOFRAN) IV, ondansetron  Assessment/Plan:  1. NSTEMI, probably demand ischemia, very minimal elevation in troponin. Not a contraindication for proceeding with EGD or further GI workup. No plans on further cardiac workup, clear-cut contraindication to antiplatelet and anticoagulation therapy in a patient with penetrating duodenal ulcer. No other specific recommendation from cardiology at this point. I have discussed these issues with the patient and also with the patient's daughter at the bedside. Also discussed with Ms. Jennye Moccasin, PA-C (GI). 2. Penetrating duodenal ulcer with surrounding inflammation  3. Lower chest pain/upper abdominal discomfort, tenderness in the epigastrium and diffusely throughout the abdomen, suggest acute GI issue  4. History of peptic ulcer disease in the past, drop in hemoglobin recently, on file/50/2014 hemoglobin was 13.0, hemoglobin today was 11.8.  5. Abnormal EKG, suggest old inferior wall myocardial infarction. This is unchanged from prior EKG. Please see above.  Outpatient Lexiscan stress 05/16/2012: Resting EKG NSR, Poor R wave progression. Inferior and lateral T inversion consider subendocardial infarct vs ischemia. Stress EKG was non diagnostic for ischemia. Stress terminated due to completion of protocol. Perfusion imaging study demonstrates a large sized inferior and inferolateral wall transmural myocardial infarction with very mild peri-infarct ischemia. RAW images reveal no significant attenuation artifact. The left ventricle cavity is mildly dilated with the left ventricular end-diastolic volume of 159 mL. Left ventricle systolic function Limited by QGS  was markedly reduced at 32%. This represents a low risk study.  Outpatient Echo 05/25/12 Left ventricular cavity is normal in size. Mild concentric hypertrophy. Normal global wall motion. Lower limits systolic global function. Visual EF is 50-55%. Grade 1 Diastolic Dysfunction. Left atrial  cavity is mildly dilated. Mild aortic regurgitation, Mild mitral regurgitation. Mild tricuspid regurgitation no pulmonary hypertension.  6. Renal stones: Chronic hydronephrosis of the left kidney. Tiny stone in the distal left ureter, nonobstructive.  Please call office for call coverage until 09/18/12.   Pamella Pert, M.D. 09/13/2012, 8:53 AM [facility] Pager: [phone] Office: [phone] If no answer: [phone]

## 2012-09-13 NOTE — Progress Notes (Signed)
Dr. Jacinto Halim called back and gave orders to just continue to watch patient, keep fluids going and let him know what the third trop comes back at since patient is asymptomatic at this time.

## 2012-09-13 NOTE — Progress Notes (Signed)
Eduardo Salazar 09/13/2012, 8:32 AM  SUBJECTIVE:       Salazar hypotension to 72/49 while sleeping.  Troponin to 0.69 C/o epigastric pain overnight, relieved with Morphine.  No pain currently, slept very well.  No stools so no HPylori stool Ag sent.   OBJECTIVE:         Vital signs in last 24 hours:    Temp:  [97.8 F (36.6 C)-98.5 F (36.9 C)] 98.1 F (36.7 C) (07/02 0730) Pulse Rate:  [55-86] 57 (07/02 0730) Resp:  [8-21] 12 (07/02 0730) BP: (72-140)/(27-79) 101/39 mmHg (07/02 0730) SpO2:  [97 %-100 %] 100 % (07/02 0730) Weight:  [66 kg (145 lb 8.1 oz)] 66 kg (145 lb 8.1 oz) (07/01 1200)   General: looks elderly, well and comfortable   Heart: RRR Chest: clear.  Breathing easily Abdomen: soft, NT, ND, active BS.  Extremities: no CCE Neuro/Psych:  Oriented x 3.  Appropriate, no memory deficits. Relaxed.   Intake/Output from previous day: 07/01 0701 - 07/02 0700 In: 3684.7 [P.O.:240; I.V.:3444.7] Out: -   Intake/Output this shift:    Lab Results:  Recent Labs  09/12/12 0527  09/12/12 1320 09/12/12 1845 09/13/12 0052  WBC 11.8*  --   --   --   --   HGB 11.3*  < > 10.1* 10.7* 9.6*  HCT 32.3*  < > 29.0* 30.6* 27.7*  PLT 179  --   --   --   --   < > = values in this interval not displayed. BMET  Recent Labs  09/12/12 0542  NA 136  K 4.2  CL 102  GLUCOSE 121*  BUN 22  CREATININE 1.50*   LFT No results found for this basename: PROT, ALBUMIN, AST, ALT, ALKPHOS, BILITOT, BILIDIR, IBILI,  in the last 72 hours PT/INR  Recent Labs  09/12/12 0527  LABPROT 13.2  INR 1.02    Studies/Results: Ct Angio Chest Pe W/cm &/or Wo Cm 09/12/2012   .  IMPRESSION:  1.  Negative CT angiogram of the chest. 2.  Penetrating posterior pyloric channel ulcer.   Original Report Authenticated By: Eduardo Salazar, M.D.   Dg Chest Port 1 View 09/12/2012   IMPRESSION: Emphysema without acute cardiopulmonary disease.  No interval change.   Original Report Authenticated  By: Eduardo Salazar, M.D.   Ct Angio Abd/pel W/ And/or W/o 09/12/2012     IMPRESSION: 1. Penetrating posterior pyloric channel ulcer with adjacent inflammation but without abscess or free air.  2. Chronic hydronephrosis of the left kidney. Tiny stone in the distal left ureter, nonobstructive.  3. Enlarged asymmetrically enhancing prostate gland.  4. Bilateral renal calculi.   Original Report Authenticated By: Eduardo Salazar, M.D.    ASSESMENT: * Symptomatic ulcer in pyloric channel. Although "penetrating" at time of CT scan there is no evidence of perforation or abscess formation. May have lement of GOO given the recent N/V  *  Elevated troponins.  Stable from previous and recent tracings: non-specific T wave depression, inferior Q waves on  EKG.  Dr Eduardo Salazar following and feels the NSTEMI is result of demand ischemia.  Feels pt is fine to proceed to EGD whenever GI wishes. .  * Chronic ureterolithiasis. Chronic left hydronephrosis. S/p cysto and stone extraction 07/31/2012.  Incontinence is new problem with IVF at 100/hour.   * Chronic kidney disease.  * Normocytic anemia * Glucose intolerance.  * Hyponatremia, mild  * Hx adenomatous colon polyps.     PLAN: *  EGD set for around 10 AM tomorrow. *  Continue full liquids and IV BID Protonix *  I reset IVF to 50 CC per hour, hopefully will help his incontinence.  *  Send stool HP Ag when sample available. *  Added prn Daily Miralax with dose today for his chronic constipation.    LOS: 1 day   Eduardo Salazar  09/13/2012, 8:32 AM Pager: [phone]

## 2012-09-13 NOTE — Progress Notes (Signed)
Patient seen, examined, and I agree with the above documentation, including the assessment and plan. Agree with EGD tomorrow.  Cardiology does not feel that mild troponin leak precludes him from endoscopic procedure Continue full liquids until midnight, IV twice a day PPI and Carafate Await HP stool antigen

## 2012-09-13 NOTE — Progress Notes (Signed)
[facility] Progress Note [place] TEAM 1 - Stepdown/ICU TEAM   Eduardo Salazar ZHY:[id number] DOB: 04-May-1925 DOA: 09/12/2012 PCP: Pearson Grippe, MD  Brief narrative: 79 year old male patient with known CAD prior MI and hypertension as well as chronic kidney disease. Presented to the emergency department with complaints of chest and upper abdominal pain for at least 2 days. This was associated with nausea and vomiting. No hematemesis. He had recently been taking Aleve for pain. Because of the chest discomfort he took nitroglycerin x3 and then called EMS. He was very weak and unable to walk according to his wife. When EMS arrived they found him to be hypotensive with a blood pressure 68/52. He was given IV fluids and sent to the emergency department. Blood pressure there was 92/56. EKG in the emergency department revealed suspicious ST elevation inferiorly but his troponin was negative. Because of his complaints CT angio of the abdomen and chest was completed. The abdominal portion revealed a penetrating posterior pyloric channel ulcer with adjacent inflammation but no abscess or free air. Chest findings revealed no evidence of PE. He was evaluated by cardiology in the emergency department who felt the patient was experiencing noncardiac pain and his EKG was stable and unchanged compared to prior EKGs from the office. Patient had recently been evaluated in the cardiology office on 09/11/2012. Patient's baseline hemoglobin was 13 and in the emergency department this had decreased to 10.9.  Assessment/Plan:    Pyloric channel ulcer -history of prior PUD and duodenitis -appreciate GI assistance -possible GOO given recent N/V -agree with check for H pylori -GI allowing full liquids - tol so far w/o N/V -EGD planned for 7/3 -cont IV PPI and PO Carafate    Hypotension -baseline SBP ranges from 95-120 -lower readings likely from ABL anemia    Acute blood loss anemia -presented with ~3 gm drop  (13 >> 10.9) -Hgb has drifted down to 9.9 today -follow and transfuse only if Hgb < 8.0    Abdominal pain/Painful swallowing -could have esophageal strictures/esophagitis -as above    CAD (coronary artery disease) / Demand ischemia -Cards following -minimal elevation in TNI due to recent hypotension and anemia    HTN (hypertension) -BP soft and in setting of anemia cont to hold anti-HTN meds    CKD (chronic kidney disease) stage 3, GFR 30-59 ml/min -Scr stable    History of kidney stones    History of reflex sympathetic dystrophy s/p cervical sympathectomy   DVT prophylaxis: SCDs Code Status: FULL Family Communication: Patient wife and daughter at bedside Disposition Plan: Remain in step down  Consultants: Gastroenterology Cardiology  Procedures: EGD pending for 7/3  Antibiotics: None  HPI/Subjective: Patient awake. Currently no nausea vomiting or abdominal pain. No apparent dysphagia or pain with swallowing on full liquid diet. No hematochezia or melena noted.  Objective: Blood pressure 93/40, pulse 64, temperature 98 F (36.7 C), temperature source Oral, resp. rate 17, height 5\' 7"  (1.702 m), weight 66 kg (145 lb 8.1 oz), SpO2 100.00%.  Intake/Output Summary (Last 24 hours) at 09/13/12 1335 Last data filed at 09/13/12 1300  Gross per 24 hour  Intake   2803 ml  Output      0 ml  Net   2803 ml   Exam: General: No acute respiratory distress, thin/cachectic appearing Lungs: Clear to auscultation bilaterally without wheezes or crackles, RA Cardiovascular: Regular rate, occasionally bradycardic, sinus rhythm without murmur gallop or rub normal S1 and S2, no peripheral edema or JVD, blood pressure  soft with systolic blood pressures as low as 89 Abdomen: Nontender, nondistended, soft, bowel sounds positive, no rebound, no ascites, no appreciable mass Musculoskeletal: No significant cyanosis, clubbing of bilateral lower extremities Neurological: Alert and oriented x  3, moves all extremities x 4 without focal neurological deficits, CN 2-12 intact  Scheduled Meds: Scheduled Meds: . pantoprazole (PROTONIX) IV  40 mg Intravenous Q12H  . sodium chloride  3 mL Intravenous Q12H  . sucralfate  1 g Oral Q6H  . tamsulosin  0.4 mg Oral Daily    Data Reviewed: Basic Metabolic Panel:  Recent Labs Lab 09/12/12 0542 09/13/12 0900  NA 136 135  K 4.2 4.0  CL 102 107  CO2  --  18*  GLUCOSE 121* 139*  BUN 22 14  CREATININE 1.50* 1.18  CALCIUM  --  8.0*   Liver Function Tests: No results found for this basename: AST, ALT, ALKPHOS, BILITOT, PROT, ALBUMIN,  in the last 168 hours No results found for this basename: LIPASE, AMYLASE,  in the last 168 hours No results found for this basename: AMMONIA,  in the last 168 hours CBC:  Recent Labs Lab 09/12/12 0527 09/12/12 0542 09/12/12 1320 09/12/12 1845 09/13/12 0052 09/13/12 0900  WBC 11.8*  --   --   --   --  7.3  HGB 11.3* 10.9* 10.1* 10.7* 9.6* 9.9*  HCT 32.3* 32.0* 29.0* 30.6* 27.7* 28.7*  MCV 86.8  --   --   --   --  87.5  PLT 179  --   --   --   --  133*   Cardiac Enzymes:  Recent Labs Lab 09/12/12 1320 09/12/12 1842 09/13/12 0052  TROPONINI <0.30 0.39* 0.69*    Recent Results (from the past 240 hour(s))  MRSA PCR SCREENING     Status: None   Collection Time    09/12/12 11:59 AM      Result Value Range Status   MRSA by PCR NEGATIVE  NEGATIVE Final   Comment:            The GeneXpert MRSA Assay (FDA     approved for NASAL specimens     only), is one component of a     comprehensive MRSA colonization     surveillance program. It is not     intended to diagnose MRSA     infection nor to guide or     monitor treatment for     MRSA infections.     Studies:  Recent x-ray studies have been reviewed in detail by the Attending Physician   Junious Silk, ANP [facility] Office  [phone] Pager [phone]  **If unable to reach the above provider after paging please  contact the Flow Manager @ [phone]  On-Call/Text Page:      [email or web address]      password [email or web address]  If 7PM-7AM, please contact night-coverage [email or web address] Password [email or web address] 09/13/2012, 1:35 PM   LOS: 1 day   I have personally examined this patient and reviewed the entire database. I have reviewed the above note, made any necessary editorial changes, and agree with its content.  Lonia Blood, MD [facility]

## 2012-09-13 NOTE — Progress Notes (Signed)
Paged Dr Jacinto Halim about patient's current bp of 72/29. Patient currently asleep. I woke him to see if he was having any kind of pain he replied no. Daughter at bedside.

## 2012-09-14 ENCOUNTER — Encounter (HOSPITAL_COMMUNITY)
Admission: EM | Disposition: A | Discharge status: Home / Self Care | Payer: Self-pay | Source: Home / Self Care | Attending: Internal Medicine

## 2012-09-14 ENCOUNTER — Encounter (HOSPITAL_COMMUNITY): Payer: Self-pay | Admitting: *Deleted

## 2012-09-14 HISTORY — PX: ESOPHAGOGASTRODUODENOSCOPY: SHX5428

## 2012-09-14 LAB — CBC
HCT: 29.6 % — ABNORMAL LOW (ref 39.0–52.0)
MCHC: 35.1 g/dL (ref 30.0–36.0)
MCV: 87.6 fL (ref 78.0–100.0)
Platelets: 144 10*3/uL — ABNORMAL LOW (ref 150–400)
RDW: 14.3 % (ref 11.5–15.5)
WBC: 6.9 10*3/uL (ref 4.0–10.5)

## 2012-09-14 LAB — BASIC METABOLIC PANEL
BUN: 16 mg/dL (ref 6–23)
CO2: 20 mEq/L (ref 19–32)
Calcium: 8.5 mg/dL (ref 8.4–10.5)
Chloride: 110 mEq/L (ref 96–112)
Creatinine, Ser: 1.3 mg/dL (ref 0.50–1.35)
GFR calc Af Amer: 56 mL/min — ABNORMAL LOW (ref >90)

## 2012-09-14 SURGERY — EGD (ESOPHAGOGASTRODUODENOSCOPY)
Anesthesia: Moderate Sedation

## 2012-09-14 MED ORDER — DIPHENHYDRAMINE HCL 50 MG/ML IJ SOLN
INTRAMUSCULAR | Status: AC
  Filled 2012-09-14: qty 1

## 2012-09-14 MED ORDER — FENTANYL CITRATE 0.05 MG/ML IJ SOLN
INTRAMUSCULAR | Status: DC | PRN
  Administered 2012-09-14: 25 ug via INTRAVENOUS

## 2012-09-14 MED ORDER — MIDAZOLAM HCL 10 MG/2ML IJ SOLN
INTRAMUSCULAR | Status: DC | PRN
  Administered 2012-09-14 (×2): 2 mg via INTRAVENOUS

## 2012-09-14 MED ORDER — MIDAZOLAM HCL 5 MG/ML IJ SOLN
INTRAMUSCULAR | Status: AC
  Filled 2012-09-14: qty 3

## 2012-09-14 MED ORDER — FENTANYL CITRATE 0.05 MG/ML IJ SOLN
INTRAMUSCULAR | Status: AC
  Filled 2012-09-14: qty 4

## 2012-09-14 NOTE — Evaluation (Signed)
Physical Therapy Evaluation Patient Details Name: Eduardo Salazar MRN: [id number] DOB: Aug 10, 1925 Today's Date: 09/14/2012 Time: 4098-1191 PT Time Calculation (min): 28 min  PT Assessment / Plan / Recommendation History of Present Illness  Eduardo Salazar is a 79 y.o. male with PMH significant for CAD/ h/o MI, HTN, renal insufficiency who presents with c/o  Chest and upper abd pain x2days. He admits to associated nausea and vomiting - non bloody x 3episodes last pm.  Clinical Impression  Pt demonstrates deficits in functional mobility as indicated below. Feel patient will benefit from skilled PT to address deficits and maximize function.  Will continue to see acutely as indicated with HHPT recommended for discharge.    PT Assessment  Patient needs continued PT services    Follow Up Recommendations  Home health PT;Supervision/Assistance - 24 hour    Does the patient have the potential to tolerate intense rehabilitation      Barriers to Discharge        Equipment Recommendations  Rolling walker with 5" wheels    Recommendations for Other Services     Frequency Min 4X/week    Precautions / Restrictions Precautions Precautions: Fall Restrictions Weight Bearing Restrictions: No   Pertinent Vitals/Pain 2/10      Mobility  Bed Mobility Bed Mobility: Supine to Sit;Sitting - Scoot to Edge of Bed Supine to Sit: 5: Supervision Sitting - Scoot to Edge of Bed: 5: Supervision Details for Bed Mobility Assistance: increased time to perform but no physical assist required Transfers Transfers: Sit to Stand;Stand to Sit Sit to Stand: 4: Min assist;From bed Stand to Sit: 4: Min assist;With armrests;To chair/3-in-1 Details for Transfer Assistance: VCs for hand placement and upright posture Ambulation/Gait Ambulation/Gait Assistance: 4: Min guard;4: Min assist Ambulation Distance (Feet): 180 Feet Assistive device: Rolling walker Ambulation/Gait Assistance Details: initally required  hands on assist for stability but as ambulation progress patient became min guard.  Gait Pattern: Step-through pattern;Decreased stride length;Trunk flexed;Narrow base of support Gait velocity: decreased General Gait Details: some instability note (pt states hard to walk without shoes) Stairs: No    Exercises     PT Diagnosis: Difficulty walking;Abnormality of gait  PT Problem List: Decreased strength;Decreased range of motion;Decreased activity tolerance;Decreased balance;Decreased mobility;Decreased knowledge of use of DME;Cardiopulmonary status limiting activity PT Treatment Interventions: DME instruction;Gait training;Stair training;Functional mobility training;Therapeutic activities;Therapeutic exercise;Balance training;Patient/family education     PT Goals(Current goals can be found in the care plan section) Acute Rehab PT Goals Patient Stated Goal: to go home PT Goal Formulation: With patient Time For Goal Achievement: 09/28/12 Potential to Achieve Goals: Good  Visit Information  Last PT Received On: 09/14/12 Assistance Needed: +1 History of Present Illness: Eduardo Salazar is a 79 y.o. male with PMH significant for CAD/ h/o MI, HTN, renal insufficiency who presents with c/o  Chest and upper abd pain x2days. He admits to associated nausea and vomiting - non bloody x 3episodes last pm.       Prior Functioning  Home Living Family/patient expects to be discharged to:: Private residence Living Arrangements: Spouse/significant other;Children Available Help at Discharge: Family Type of Home: House Home Access: Stairs to enter Secretary/administrator of Steps: 2 Entrance Stairs-Rails: Can reach both Home Layout: One level Home Equipment: Walker - 2 wheels;Cane - quad;Cane - single point Prior Function Level of Independence: Independent with assistive device(s) Comments: used cane Communication Communication: No difficulties Dominant Hand: Right    Cognition   Cognition Arousal/Alertness: Awake/alert Behavior During Therapy: WFL for tasks  assessed/performed Overall Cognitive Status: Within Functional Limits for tasks assessed    Extremity/Trunk Assessment Upper Extremity Assessment Upper Extremity Assessment: Overall WFL for tasks assessed Lower Extremity Assessment Lower Extremity Assessment: Overall WFL for tasks assessed   Balance High Level Balance High Level Balance Activites: Side stepping;Backward walking;Direction changes;Sudden stops High Level Balance Comments: some instability requiring minimal assist  End of Session PT - End of Session Equipment Utilized During Treatment: Gait belt Activity Tolerance: Patient tolerated treatment well Patient left: in chair;with call bell/phone within reach;with family/visitor present Nurse Communication: Mobility status  GP     Fabio Asa 09/14/2012, 4:35 PM Charlotte Crumb, PT DPT  [phone]

## 2012-09-14 NOTE — Op Note (Signed)
[facility] [address] [place], [address]   ENDOSCOPY PROCEDURE REPORT  PATIENT: Eduardo Salazar, Eduardo Salazar  MR#: [id number] BIRTHDATE: [birth year] , 86  yrs. old GENDER: Male ENDOSCOPIST: Beverley Fiedler, MD REFERRED BY:  Hospitalist, [facility] PROCEDURE DATE:  09/14/2012 PROCEDURE:  EGD w/ biopsy ASA CLASS:     Class III INDICATIONS:  Epigastric pain.   abnormal CT of the GI tract. MEDICATIONS: These medications were titrated to patient response per physician's verbal order, Fentanyl 25 mcg IV, and Versed 4 mg IV TOPICAL ANESTHETIC: Cetacaine Spray  DESCRIPTION OF PROCEDURE: After the risks benefits and alternatives of the procedure were thoroughly explained, informed consent was obtained.  The Pentax Gastroscope [id number] endoscope was introduced through the mouth and advanced to the second portion of the duodenum. Without limitations.  The instrument was slowly withdrawn as the mucosa was fully examined.    ESOPHAGUS: Moderately severe reflux esophagitis was found in the middle third of the esophagus and lower third of the esophagus. There was erythema and scattered superficial ulceration felt to be reflux-induced.  A moderate Schatzki ring was found at the gastroesophageal junction , 40 cm from the incisors.   This area was easily traversed with the upper endoscope, but on reinspection at the end of the exam a very small superficial mucosal tear was seen indicating ring disruption. A small hiatal hernia was noted.   STOMACH: A single non-bleeding deep and clean-based ulcer, measuring 10 x 5mm in size, with surrounding edema was found at the pyloric channel.  This ulcer is associated with significant edema narrowing the pyloric channel.  The scope was able to be advanced into the duodenum despite this edema. Biopsies were taken at edge of the ulcer.  DUODENUM: The duodenal mucosa showed no abnormalities in the bulb and second portion of  the duodenum.  Retroflexed views revealed a small (2 cm) hiatal hernia. The scope was then withdrawn from the patient and the procedure completed.  COMPLICATIONS: There were no complications.  ENDOSCOPIC IMPRESSION: 1.   Esophagitis consistent with reflux esophagitis in the middle third of the esophagus and lower third of the esophagus 2.   Schatzki ring was found at the gastroesophageal junction and 40 cm from the incisors 3.   Small hiatal hernia 4.   Single non-bleeding ulcer, measuring 10 x 5mm in size, was found at the pylorus 5.   The duodenal mucosa showed no abnormalities in the bulb and second portion of the duodenum RECOMMENDATIONS: 1.  Await pathology results 2.  Follow-up of helicobacter pylori status, treat if indicated 3.  Continue twice-daily PPI for at least 12 weeks 4.  Continue sucralfate 1 g 4 times daily for 1 month 5.  Repeat EGD recommended in 12 weeks to document healing of gastric ulcer 6.  Anti-reflux regimen to be followed (remain upright at least 60 minutes after eating/drinking 7.  Soft, low residue/fiber diet 8.  Strict NSAID avoidance   eSigned:  Beverley Fiedler, MD 09/14/2012 10:23 AM   CC:The Patient and Pearson Grippe, MD  PATIENT NAME:  Vue, Pavon MR#: [id number]

## 2012-09-14 NOTE — Progress Notes (Signed)
[facility] Progress Note [place] TEAM 1 - Stepdown/ICU TEAM   Eduardo Salazar QMV:[id number] DOB: 1926-01-20 DOA: 09/12/2012 PCP: Pearson Grippe, MD  Brief narrative: 79 year old male patient with known CAD prior MI and hypertension as well as chronic kidney disease. Presented to the emergency department with complaints of chest and upper abdominal pain for at least 2 days. This was associated with nausea and vomiting. No hematemesis. He had recently been taking Aleve for pain. Because of the chest discomfort he took nitroglycerin x3 and then called EMS. He was very weak and unable to walk according to his wife. When EMS arrived they found him to be hypotensive with a blood pressure 68/52. He was given IV fluids and sent to the emergency department. Blood pressure there was 92/56. EKG in the emergency department revealed suspicious ST elevation inferiorly but his troponin was negative. Because of his complaints CT angio of the abdomen and chest was completed. The abdominal portion revealed a penetrating posterior pyloric channel ulcer with adjacent inflammation but no abscess or free air. Chest findings revealed no evidence of PE. He was evaluated by cardiology in the emergency department who felt the patient was experiencing noncardiac pain and his EKG was stable and unchanged compared to prior EKGs from the office. Patient had recently been evaluated in the cardiology office on 09/11/2012. Patient's baseline hemoglobin was 13 and in the emergency department this had decreased to 10.9.  Assessment/Plan:    Pyloric channel ulcer -history of prior PUD and duodenitis -colonoscopy revealed: reflux esophagitis from middle third to lower third of esophagus, Schatzki ring at GE junction, single non bleeding ulcer (10 x 5 mm) at pylorus -GI recs as follows: 1. Await pathology results  2. Follow-up of helicobacter pylori status, treat if indicated  3. Continue twice-daily PPI for at least 12 weeks  4.  Continue sucralfate 1 g 4 times daily for 1 month  5. Repeat EGD recommended in 12 weeks to document healing of  gastric ulcer  6. Anti-reflux regimen to be followed (remain upright at least 60  minutes after eating/drinking  7. Soft, low residue/fiber diet  8. Strict NSAID avoidance   Hypotension -baseline SBP ranges from 95-120 on his usual meds -lower readings likely from ABL anemia -cont to hold meds for now -resolved so will allow to mobilize    Acute blood loss anemia -presented with ~3 gm drop (13 >> 10.9) -Hgb stable @ 9.9 today -follow and transfuse only if Hgb < 8.0    Abdominal pain/Painful swallowing -as above re endoscopic findings    CAD (coronary artery disease) / Demand ischemia -Cards following -minimal elevation in TNI due to recent hypotension and anemia    HTN (hypertension) -BP soft and in setting of anemia cont to hold anti-HTN meds    CKD (chronic kidney disease) stage 3, GFR 30-59 ml/min -Scr stable    History of kidney stones    History of reflex sympathetic dystrophy s/p cervical sympathectomy   DVT prophylaxis: SCDs Code Status: FULL Family Communication: Patient wife and daughter at bedside Disposition Plan: Transfer to floor Consultants: Gastroenterology Cardiology  Procedures: EGD  ENDOSCOPIC IMPRESSION:  1. Esophagitis consistent with reflux esophagitis in the middle  third of the esophagus and lower third of the esophagus  2. Schatzki ring was found at the gastroesophageal junction and 40  cm from the incisors  3. Small hiatal hernia  4. Single non-bleeding ulcer, measuring 10 x 5mm in size, was  found at the pylorus  5. The duodenal mucosa showed no abnormalities in the bulb and  second portion of the duodenum    Antibiotics: None  HPI/Subjective: Patient alert and tolerating solid diet without abdominal pain or N/V. Wants to go home ASAP.  Objective: Blood pressure 107/45, pulse 62, temperature 98.1 F (36.7 C),  temperature source Oral, resp. rate 23, height 5\' 7"  (1.702 m), weight 66 kg (145 lb 8.1 oz), SpO2 100.00%.  Intake/Output Summary (Last 24 hours) at 09/14/12 1149 Last data filed at 09/14/12 1100  Gross per 24 hour  Intake 942.67 ml  Output      0 ml  Net 942.67 ml   Exam: General: No acute respiratory distress, thin/cachectic appearing Lungs: Clear to auscultation bilaterally without wheezes or crackles, RA Cardiovascular: Regular rate, occasionally bradycardic, sinus rhythm without murmur gallop or rub normal S1 and S2, no peripheral edema or JVD, blood pressure soft but much better than yesterday Abdomen: Nontender, nondistended, soft, bowel sounds positive, no rebound, no ascites, no appreciable mass Musculoskeletal: No significant cyanosis, clubbing of bilateral lower extremities Neurological: Alert and oriented x 3, moves all extremities x 4 without focal neurological deficits, CN 2-12 intact  Scheduled Meds: Scheduled Meds: . pantoprazole (PROTONIX) IV  40 mg Intravenous Q12H  . sodium chloride  3 mL Intravenous Q12H  . sucralfate  1 g Oral Q6H  . tamsulosin  0.4 mg Oral Daily    Data Reviewed: Basic Metabolic Panel:  Recent Labs Lab 09/12/12 0542 09/13/12 0900 09/14/12 0500  NA 136 135 137  K 4.2 4.0 4.4  CL 102 107 110  CO2  --  18* 20  GLUCOSE 121* 139* 110*  BUN 22 14 16   CREATININE 1.50* 1.18 1.30  CALCIUM  --  8.0* 8.5   Liver Function Tests: No results found for this basename: AST, ALT, ALKPHOS, BILITOT, PROT, ALBUMIN,  in the last 168 hours No results found for this basename: LIPASE, AMYLASE,  in the last 168 hours No results found for this basename: AMMONIA,  in the last 168 hours CBC:  Recent Labs Lab 09/12/12 0527  09/12/12 1320 09/12/12 1845 09/13/12 0052 09/13/12 0900 09/14/12 0500  WBC 11.8*  --   --   --   --  7.3 6.9  HGB 11.3*  < > 10.1* 10.7* 9.6* 9.9* 10.4*  HCT 32.3*  < > 29.0* 30.6* 27.7* 28.7* 29.6*  MCV 86.8  --   --   --   --   87.5 87.6  PLT 179  --   --   --   --  133* 144*  < > = values in this interval not displayed. Cardiac Enzymes:  Recent Labs Lab 09/12/12 1320 09/12/12 1842 09/13/12 0052  TROPONINI <0.30 0.39* 0.69*    Recent Results (from the past 240 hour(s))  MRSA PCR SCREENING     Status: None   Collection Time    09/12/12 11:59 AM      Result Value Range Status   MRSA by PCR NEGATIVE  NEGATIVE Final   Comment:            The GeneXpert MRSA Assay (FDA     approved for NASAL specimens     only), is one component of a     comprehensive MRSA colonization     surveillance program. It is not     intended to diagnose MRSA     infection nor to guide or     monitor treatment for     MRSA  infections.     Studies:  Recent x-ray studies have been reviewed in detail by the Attending Physician   Junious Silk, ANP [facility] Office  [phone] Pager [phone]  **If unable to reach the above provider after paging please contact the Flow Manager @ [phone]  On-Call/Text Page:      [email or web address]      password [email or web address]  If 7PM-7AM, please contact night-coverage [email or web address] Password [facility] 09/14/2012, 11:49 AM   LOS: 2 days    I have examined the patient, reviewed the chart and modified the above note which I agree with.   Lavoris Canizales,MD [phone] 09/14/2012, 1:54 PM

## 2012-09-15 ENCOUNTER — Encounter (HOSPITAL_COMMUNITY): Payer: Self-pay | Admitting: Internal Medicine

## 2012-09-15 DIAGNOSIS — K257 Chronic gastric ulcer without hemorrhage or perforation: Secondary | ICD-10-CM

## 2012-09-15 LAB — BASIC METABOLIC PANEL
Calcium: 8.6 mg/dL (ref 8.4–10.5)
GFR calc Af Amer: 49 mL/min — ABNORMAL LOW (ref >90)
GFR calc non Af Amer: 42 mL/min — ABNORMAL LOW (ref >90)
Potassium: 4.4 mEq/L (ref 3.5–5.1)
Sodium: 137 mEq/L (ref 135–145)

## 2012-09-15 LAB — CBC
MCH: 30.5 pg (ref 26.0–34.0)
MCHC: 35 g/dL (ref 30.0–36.0)
Platelets: 150 10*3/uL (ref 150–400)

## 2012-09-15 MED ORDER — SUCRALFATE 1 GM/10ML PO SUSP: 1.0000 g | Freq: Four times a day (QID) | ORAL | Status: DC

## 2012-09-15 MED ORDER — PANTOPRAZOLE SODIUM 40 MG PO TBEC: 40.0000 mg | DELAYED_RELEASE_TABLET | Freq: Two times a day (BID) | ORAL | Status: DC

## 2012-09-15 MED ORDER — PANTOPRAZOLE SODIUM 40 MG PO TBEC
40.0000 mg | DELAYED_RELEASE_TABLET | Freq: Two times a day (BID) | ORAL | Status: DC
  Administered 2012-09-15: 40 mg via ORAL
  Filled 2012-09-15: qty 1

## 2012-09-15 MED ORDER — DIPHENHYDRAMINE HCL 12.5 MG/5ML PO ELIX
12.5000 mg | ORAL_SOLUTION | Freq: Every evening | ORAL | Status: DC | PRN
  Administered 2012-09-15: 12.5 mg via ORAL
  Filled 2012-09-15: qty 10

## 2012-09-15 NOTE — Progress Notes (Signed)
Pt's wife says that if the answering machine picks up, to leave a message because they screen calls and will pick up when they know who is calling.  EPIC info for address and phone are correct.  [facility] chosen for agency for HHPT when choice given to patient and family.

## 2012-09-15 NOTE — Progress Notes (Signed)
Physical Therapy Treatment Patient Details Name: Eduardo Salazar MRN: [id number] DOB: 1926-02-18 Today's Date: 09/15/2012 Time: 5621-3086 PT Time Calculation (min): 42 min  PT Assessment / Plan / Recommendation  PT Comments   Pt progressing with mobility but continues to demonstrate balance and endurance deficits and requires education for use of DME. Family present and educated for cues for home assist as well as skin breakdown prevention. Pt incontinent on arrival and assist for pericare and linen change. Recommend RW at all times as well as handrail installment for stairs.   Follow Up Recommendations        Does the patient have the potential to tolerate intense rehabilitation     Barriers to Discharge        Equipment Recommendations       Recommendations for Other Services    Frequency     Progress towards PT Goals Progress towards PT goals: Progressing toward goals  Plan Current plan remains appropriate    Precautions / Restrictions Precautions Precautions: Fall Restrictions Weight Bearing Restrictions: No   Pertinent Vitals/Pain No pain    Mobility  Bed Mobility Supine to Sit: 5: Supervision Sitting - Scoot to Edge of Bed: 5: Supervision Details for Bed Mobility Assistance: cueing to initiate Transfers Sit to Stand: 4: Min assist;From bed;From toilet Stand to Sit: 4: Min guard;To bed;To chair/3-in-1;To toilet Details for Transfer Assistance: VCs for hand placement and safety Ambulation/Gait Ambulation/Gait Assistance: 4: Min guard Ambulation Distance (Feet): 400 Feet Assistive device: Rolling walker Ambulation/Gait Assistance Details: cues for posture and position in RW as well as assist for stability with turns x 1 Gait Pattern: Shuffle;Narrow base of support;Trunk flexed Gait velocity: 30'/ 43 sec= .697 ft/sec placing pt at high fall risk Stairs: Yes Stairs Assistance: 4: Min assist Stairs Assistance Details (indicate cue type and reason): used rail and HHA  as pt uses wall and cane at home. Able to perform with assist but recommended rail be installed at home and family agreeable Stair Management Technique: One rail Left;Forwards Number of Stairs: 2    Exercises     PT Diagnosis:    PT Problem List:   PT Treatment Interventions:     PT Goals (current goals can now be found in the care plan section)    Visit Information  Last PT Received On: 09/15/12 Assistance Needed: +1 History of Present Illness:  PMH significant for CAD/ h/o MI, HTN, renal insufficiency who presents with c/o  Chest and upper abd pain x2days. He admits to associated nausea and vomiting - non bloody x 3episodes last pm. Found to have pyloric ulcer    Subjective Data      Cognition  Cognition Arousal/Alertness: Awake/alert Behavior During Therapy: WFL for tasks assessed/performed Overall Cognitive Status: Impaired/Different from baseline Area of Impairment: Safety/judgement Safety/Judgement: Decreased awareness of safety    Balance  Balance Balance Assessed: Yes Static Standing Balance Static Standing - Balance Support: No upper extremity supported;During functional activity Static Standing - Level of Assistance: 4: Min assist Static Standing - Comment/# of Minutes: pt attempted to don shirt in standing and required assist to prevent fall.   End of Session PT - End of Session Equipment Utilized During Treatment: Gait belt Activity Tolerance: Patient tolerated treatment well Patient left: in chair;with call bell/phone within reach;with family/visitor present   GP     Toney Sang Beth 09/15/2012, 2:12 PM Delaney Meigs, PT [phone]

## 2012-09-15 NOTE — Progress Notes (Signed)
PHARMACIST - PHYSICIAN COMMUNICATION DR:   [facility] [employer] [facility] CONCERNING: IV to Oral Route Change Policy  RECOMMENDATION: This patient is receiving Protonix by the intravenous route.  Based on criteria approved by the Pharmacy and Therapeutics Committee, Protonix is being converted to the equivalent oral dose form(s).   DESCRIPTION: These criteria include:  Patient has a documented ability to take oral medications  Plan for continued treatment for at least 1 day.  Patient does not have active GI bleeding, impaired GI absorption, and is not s/p esophagectomy.  If you have questions about this conversion, please contact the Pharmacy Department  []   [phone] )  [facility] [x]   [phone] )  [facility]  []   [phone] )  [facility] []   [phone] )  [facility]

## 2012-09-15 NOTE — Discharge Summary (Signed)
Physician Discharge Summary  Eduardo Salazar GNF:[id number] DOB: 1925-07-16 DOA: 09/12/2012  PCP: Pearson Grippe, MD  Admit date: 09/12/2012 Discharge date: 09/15/2012  Time spent: 45 minutes  Recommendations for Outpatient Follow-up:  1. F/u on H Pylori   Discharge Diagnoses:  Active Problems:   Pyloric channel ulcer   Hypotension   Abdominal pain   CAD (coronary artery disease)   Acute blood loss anemia   HTN (hypertension)   CKD (chronic kidney disease) stage 3, GFR 30-59 ml/min   History of kidney stones   History of reflex sympathetic dystrophy   Painful swallowing   Demand ischemia   Discharge Condition: stable  Diet recommendation: heart healthy  Filed Weights   09/12/12 0713 09/12/12 1200  Weight: 67.2 kg (148 lb 2.4 oz) 66 kg (145 lb 8.1 oz)    History of present illness:  79 year old male patient with known CAD prior MI and hypertension as well as chronic kidney disease. Presented to the emergency department with complaints of chest and upper abdominal pain for at least 2 days. This was associated with nausea and vomiting. No hematemesis. He had recently been taking Aleve for pain. Because of the chest discomfort he took nitroglycerin x3 and then called EMS. He was very weak and unable to walk according to his wife. When EMS arrived they found him to be hypotensive with a blood pressure 68/52. He was given IV fluids and sent to the emergency department. Blood pressure there was 92/56. EKG in the emergency department revealed suspicious ST elevation inferiorly but his troponin was negative. Because of his complaints CT angio of the abdomen and chest was completed. The abdominal portion revealed a penetrating posterior pyloric channel ulcer with adjacent inflammation but no abscess or free air. Chest findings revealed no evidence of PE. He was evaluated by cardiology in the emergency department who felt the patient was experiencing noncardiac pain and his EKG was stable and  unchanged compared to prior EKGs from the office. Patient had recently been evaluated in the cardiology office on 09/11/2012. Patient's baseline hemoglobin was 13 and in the emergency department this had decreased to 10.9.   Hospital Course:  Pyloric channel ulcer  -history of prior PUD and duodenitis  -appreciate GI assistance  -agree with check for H pylori - pt will need to f/u on biopsy results- asked to call office- number given  -cont BID PPI  For min 3 mo and PO Carafate for 1 mo  Hypotension  -baseline SBP ranges from 95-120  -lower readings likely from ABL anemia  - holding BP meds for now- advised to check BP at home and resume meds as needed  Acute blood loss anemia  -presented with ~3 gm drop (13 >> 10.9)  -Hgb has drifted down to 9.9 today   Abdominal pain/Painful swallowing  -resolved - tolerating solids  CAD (coronary artery disease) / Demand ischemia  -Cards following  -minimal elevation in TNI due to recent hypotension and anemia   HTN (hypertension)  -BP soft and in setting of anemia cont to hold anti-HTN meds   CKD (chronic kidney disease) stage 3, GFR 30-59 ml/min  -Scr stable   History of kidney stones   History of reflex sympathetic dystrophy s/p cervical sympathectomy      Procedures: EGD -ENDOSCOPIC IMPRESSION:  1. Esophagitis consistent with reflux esophagitis in the middle  third of the esophagus and lower third of the esophagus  2. Schatzki ring was found at the gastroesophageal junction and 40  cm  from the incisors  3. Small hiatal hernia  4. Single non-bleeding ulcer, measuring 10 x 5mm in size, was  found at the pylorus  5. The duodenal mucosa showed no abnormalities in the bulb and  second portion of the duodenum   Consultations:  GI  Discharge Exam: Filed Vitals:   09/14/12 1500 09/14/12 1826 09/14/12 2300 09/15/12 0620  BP:  107/54 97/52 130/63  Pulse:  71 82 67  Temp:  97.7 F (36.5 C) 98.1 F (36.7 C) 98.4 F (36.9 C)   TempSrc:  Oral Oral Oral  Resp: 20 19 18 18   Height:      Weight:      SpO2: 85% 100% 100% 100%    General: AAO x 3 Cardiovascular: RRR, no murmurs Respiratory: CTA b/l   Discharge Instructions  Discharge Orders   Future Orders Complete By Expires     Diet - low sodium heart healthy  As directed     Comments:      Avoid Caffeine, Alcohol, Ibuprofen, Naprosyn and Aspirin    Increase activity slowly  As directed         Medication List    STOP taking these medications       aspirin EC 81 MG tablet      TAKE these medications       carvedilol 3.125 MG tablet  Commonly known as:  COREG  Take 3.125 mg by mouth every morning.     lisinopril 2.5 MG tablet  Commonly known as:  PRINIVIL,ZESTRIL  Take 2.5 mg by mouth every morning.     nitroGLYCERIN 0.4 MG SL tablet  Commonly known as:  NITROSTAT  Place 0.4 mg under the tongue every 5 (five) minutes as needed for chest pain.     pantoprazole 40 MG tablet  Commonly known as:  PROTONIX  Take 1 tablet (40 mg total) by mouth 2 (two) times daily before a meal.     spironolactone 50 MG tablet  Commonly known as:  ALDACTONE  Take 25 mg by mouth every morning.     sucralfate 1 GM/10ML suspension  Commonly known as:  CARAFATE  Take 10 mLs (1 g total) by mouth every 6 (six) hours.     tamsulosin 0.4 MG Caps  Commonly known as:  FLOMAX  Take 0.4 mg by mouth daily.     traMADol 50 MG tablet  Commonly known as:  ULTRAM  Take 50 mg by mouth every 6 (six) hours as needed for pain.       No Known Allergies    The results of significant diagnostics from this hospitalization (including imaging, microbiology, ancillary and laboratory) are listed below for reference.    Significant Diagnostic Studies: Ct Angio Chest Pe W/cm &/or Wo Cm  09/12/2012   *RADIOLOGY REPORT*  Clinical Data: Epigastric pain and back pain and chest pressure. Nausea.  CT ANGIOGRAPHY CHEST  Technique:  Multidetector CT imaging of the chest using the  standard protocol during bolus administration of intravenous contrast. Multiplanar reconstructed images including MIPs were obtained and reviewed to evaluate the vascular anatomy.  Contrast: OMNIPAQUE IOHEXOL 350 MG/ML SOLN  Comparison: CT scan of the abdomen pelvis dated 07/21/2012  Findings: The patient has a penetrating posterior pyloric channel ulcer best seen on image number 137 of series 6.  There is new fluid surrounding the duodenal bulb and pylorus.  There is no free air.  There is no evidence of aortic dissection.  The patient does have moderate coronary  artery calcification.  The heart size is normal.  No hilar or mediastinal adenopathy.  The lungs are clear.  No effusions.  No acute osseous abnormalities.  No pulmonary emboli.  IMPRESSION:  1.  Negative CT angiogram of the chest. 2.  Penetrating posterior pyloric channel ulcer.   Original Report Authenticated By: [name], M.D.   Dg Chest Port 1 View  09/12/2012   *RADIOLOGY REPORT*  Clinical Data: Chest pain.  Abdominal pain.  PORTABLE CHEST - 1 VIEW  Comparison: 07/31/2012.  Findings: Emphysema.  Clips project over the thoracic inlet and in the mediastinum.  No airspace disease.  No effusion.  Left basilar atelectasis.  The No pneumothorax.  Bilateral pleural apical scarring. Monitoring leads are projected over the chest.  The cardiopericardial silhouette is within normal limits.  IMPRESSION: Emphysema without acute cardiopulmonary disease.  No interval change.   Original Report Authenticated By: [name], M.D.   Ct Angio Abd/pel W/ And/or W/o  09/12/2012   *RADIOLOGY REPORT*  Clinical Data: Epigastric and back pain with nausea.  Chest pressure.  CT ANGIOGRAPHY ABDOMEN AND PELVIS WITH CONTRAST AND WITHOUT CONTRAST  Comparison: CT scan of the abdomen and pelvis dated 07/21/2012  Findings: The patient has what appears to be a posterior penetrating pyloric channel ulcer best seen on image number 137 of series 6.  There is new fluid  surrounding the distal antrum and pyloric channel and proximal duodenal bulb.  No free air.  The abdominal aorta demonstrates focal areas of calcification and atheromatous disease but there is no dissection.  The origins of the celiac and superior mesenteric arteries are widely patent.  The inferior mesenteric artery and renal arteries are patent.  There are multiple tiny stones in the gallbladder.  No bile duct dilatation.  Liver parenchyma, spleen, pancreas and adrenal glands are normal.  There is an 8 mm stone in the lower pole of the left kidney as well as a 2 mm stone in the lower pole.  There is abnormal decreased perfusion to the left kidney with chronic marked hydronephrosis.  The left ureter is markedly dilated to the level of the bladder.  There is a small stone in a posterior calix of the mid left kidney, unchanged.  There is a tiny stone in the dilated distal left ureter but this is not obstructive.  There appears be an abnormally high insertion of the left ureter in the bladder.  The prostate gland is enlarged with increased enhancement in the periphery of the right lobe with multiple calcifications.  There are scattered diverticula in the left side of the colon. No diverticulitis. The terminal ileum and appendix are normal.  The bladder is distended to the level of the umbilicus.  No acute osseous abnormalities.  IMPRESSION: 1. Penetrating posterior pyloric channel ulcer with adjacent inflammation but without abscess or free air.  2. Chronic hydronephrosis of the left kidney. Tiny stone in the distal left ureter, nonobstructive.  3. Enlarged asymmetrically enhancing prostate gland.  4. Bilateral renal calculi.   Original Report Authenticated By: [name], M.D.    Microbiology: Recent Results (from the past 240 hour(s))  MRSA PCR SCREENING     Status: None   Collection Time    09/12/12 11:59 AM      Result Value Range Status   MRSA by PCR NEGATIVE  NEGATIVE Final   Comment:             The GeneXpert MRSA Assay (FDA     approved for  NASAL specimens     only), is one component of a     comprehensive MRSA colonization     surveillance program. It is not     intended to diagnose MRSA     infection nor to guide or     monitor treatment for     MRSA infections.     Labs: Basic Metabolic Panel:  Recent Labs Lab 09/12/12 0542 09/13/12 0900 09/14/12 0500 09/15/12 0535  NA 136 135 137 137  K 4.2 4.0 4.4 4.4  CL 102 107 110 108  CO2  --  18* 20 22  GLUCOSE 121* 139* 110* 100*  BUN 22 14 16 20   CREATININE 1.50* 1.18 1.30 1.47*  CALCIUM  --  8.0* 8.5 8.6   Liver Function Tests: No results found for this basename: AST, ALT, ALKPHOS, BILITOT, PROT, ALBUMIN,  in the last 168 hours No results found for this basename: LIPASE, AMYLASE,  in the last 168 hours No results found for this basename: AMMONIA,  in the last 168 hours CBC:  Recent Labs Lab 09/12/12 0527  09/12/12 1845 09/13/12 0052 09/13/12 0900 09/14/12 0500 09/15/12 0535  WBC 11.8*  --   --   --  7.3 6.9 7.3  HGB 11.3*  < > 10.7* 9.6* 9.9* 10.4* 9.7*  HCT 32.3*  < > 30.6* 27.7* 28.7* 29.6* 27.7*  MCV 86.8  --   --   --  87.5 87.6 87.1  PLT 179  --   --   --  133* 144* 150  < > = values in this interval not displayed. Cardiac Enzymes:  Recent Labs Lab 09/12/12 1320 09/12/12 1842 09/13/12 0052  TROPONINI <0.30 0.39* 0.69*   BNP: BNP (last 3 results) No results found for this basename: PROBNP,  in the last 8760 hours CBG: No results found for this basename: GLUCAP,  in the last 168 hours     Signed:  Mason Dibiasio  [facility] 09/15/2012, 2:29 PM

## 2012-09-21 ENCOUNTER — Encounter: Payer: Self-pay | Admitting: Internal Medicine

## 2012-09-25 ENCOUNTER — Telehealth: Payer: Self-pay | Admitting: *Deleted

## 2012-09-25 ENCOUNTER — Other Ambulatory Visit: Payer: Self-pay | Admitting: *Deleted

## 2012-09-25 DIAGNOSIS — IMO0002 Reserved for concepts with insufficient information to code with codable children: Secondary | ICD-10-CM

## 2012-09-25 NOTE — Telephone Encounter (Signed)
Message copied by Florene Glen on Mon Sep 25, 2012  8:10 AM ------      Message from: Beverley Fiedler      Created: Thu Sep 21, 2012  3:32 PM      Regarding: FW: office follow up       OV in about 1 month            ----- Message -----         From: Dianah Field, PA-C         Sent: 09/18/2012   2:01 PM           To: Beverley Fiedler, MD      Subject: office follow up                                         Just a reminder to arrange ROV for Mr Tankard.        ------

## 2012-09-25 NOTE — Telephone Encounter (Signed)
Spoke with pt's daughter and scheduled a f/u appt for 10/17/12 at 0900am. We will let her know of the EGD appt ; she stated understanding with the results of path.

## 2012-09-25 NOTE — Telephone Encounter (Signed)
Scheduled pt for 11/14/12 at 0800am at [facility] ; repeat EGD to assess healing of stomach ulcer, BOOKING # [id number]. Will mail instructions to pt and notified his daughter to call if she had any questions; she stated understanding.

## 2012-09-25 NOTE — Telephone Encounter (Signed)
Also need EGD in 8/12 weeks for healing of ulcer.

## 2012-10-12 ENCOUNTER — Encounter: Payer: Self-pay | Admitting: Internal Medicine

## 2012-10-16 ENCOUNTER — Encounter (HOSPITAL_COMMUNITY): Admission: RE | Payer: Self-pay | Source: Ambulatory Visit

## 2012-10-16 ENCOUNTER — Ambulatory Visit (HOSPITAL_COMMUNITY): Admission: RE | Admit: 2012-10-16 | Payer: Medicare Other | Source: Ambulatory Visit | Admitting: Gastroenterology

## 2012-10-16 ENCOUNTER — Other Ambulatory Visit: Payer: Self-pay | Admitting: Gastroenterology

## 2012-10-16 SURGERY — MANOMETRY, ESOPHAGUS
Anesthesia: Topical

## 2012-10-16 MED ORDER — PANTOPRAZOLE SODIUM 40 MG PO TBEC: 40.0000 mg | DELAYED_RELEASE_TABLET | Freq: Two times a day (BID) | ORAL | Status: DC

## 2012-10-17 ENCOUNTER — Ambulatory Visit (INDEPENDENT_AMBULATORY_CARE_PROVIDER_SITE_OTHER): Payer: Medicare Other | Admitting: Internal Medicine

## 2012-10-17 ENCOUNTER — Encounter: Payer: Self-pay | Admitting: Internal Medicine

## 2012-10-17 VITALS — BP 110/70 | HR 66 | Ht 67.0 in | Wt 141.0 lb

## 2012-10-17 DIAGNOSIS — K219 Gastro-esophageal reflux disease without esophagitis: Secondary | ICD-10-CM

## 2012-10-17 DIAGNOSIS — K253 Acute gastric ulcer without hemorrhage or perforation: Secondary | ICD-10-CM

## 2012-10-17 DIAGNOSIS — K2100 Gastro-esophageal reflux disease with esophagitis, without bleeding: Secondary | ICD-10-CM

## 2012-10-17 MED ORDER — PANTOPRAZOLE SODIUM 40 MG PO TBEC: 40.0000 mg | DELAYED_RELEASE_TABLET | Freq: Two times a day (BID) | ORAL | Status: DC

## 2012-10-17 NOTE — Patient Instructions (Addendum)
We have sent the following medications to your pharmacy for you to pick up at your convenience: protonix 40 mg twice a day  You have a procedure scheduled with Dr. Rhea Belton at [facility] on 11/14/2012 @ 8am                                               We are excited to introduce MyChart, a new best-in-class service that provides you online access to important information in your electronic medical record. We want to make it easier for you to view your health information - all in one secure location - when and where you need it. We expect MyChart will enhance the quality of care and service we provide.  When you register for MyChart, you can:    View your test results.    Request appointments and receive appointment reminders via email.    Request medication renewals.    View your medical history, allergies, medications and immunizations.    Communicate with your physician's office through a password-protected site.    Conveniently print information such as your medication lists.  To find out if MyChart is right for you, please talk to a member of our clinical staff today. We will gladly answer your questions about this free health and wellness tool.  If you are age 77 or older and want a member of your family to have access to your record, you must provide written consent by completing a proxy form available at our office. Please speak to our clinical staff about guidelines regarding accounts for patients younger than age 61.  As you activate your MyChart account and need any technical assistance, please call the MyChart technical support line at [phone]) or email your question to [email or web address]. If you email your question(s), please include your name, a return phone number and the best time to reach you.  If you have non-urgent health-related questions, you can send a message to our office through MyChart at [email or web address]. If you have a  medical emergency, call 911.  Thank you for using MyChart as your new health and wellness resource!   MyChart licensed from [employer],  0102-7253. Patents Pending.

## 2012-10-17 NOTE — Progress Notes (Signed)
Patient ID: Eduardo Salazar, male   DOB: 08/14/1925, 79 y.o.   MRN: [id number] HPI: Eduardo Salazar is an 79 yo male with PMH of PUD with hospitalization and partial GOO symptoms in early July 2014, HTN, CAD, kidney stones who is seen in hospital followup. He is here today with his daughter. He is seen during hospitalization the first week of July 2014 and went for upper endoscopy on 09/14/2012 revealing a nonbleeding, deep and greater ulcer near the pyloric channel. This ulcer was creating significant edema and narrowing of the pyloric channel. He also had moderate reflux esophagitis in the mid and distal esophagus a moderate nonobstructing Schatzki's ring. He left the hospital on twice daily PPI and Carafate 4 times daily. He reports today that he is feeling very well. He denies abdominal pain. No nausea or vomiting. He is eating well. No dysphagia or odynophagia. No melena or rectal bleeding. No diarrhea or significant constipation. He has been avoiding foods such as chocolate, peppermint and caffeine. He's also tried to avoid spices such as black pepper. No heartburn.  Biopsies from the ulcer showed inflamed and ulcerated gastric type mucosa with goblet cell metaplasia. There was no evidence of H. pylori, dysplasia or malignancy.  He is scheduled to return the first week of September for surveillance endoscopy  Past Medical History  Diagnosis Date  . Arthritis   . Hypertension   . Myocardial infarction     mild"no symptoms" was told 2 months ago  . Pigmented skin lesions 07-27-12    generalized, never any skin cancer dx.  . Edema of extremities     lower extremities,feet; compression socks worned  . Kidney stones 07-27-12    hx. kidney stones, at present left ureteral stone  . Gastric ulcer 09/2012    Past Surgical History  Procedure Laterality Date  . Cataract extraction, bilateral    . Wrist surgery      "nerve repair"  . Cystoscopy with retrograde pyelogram, ureteroscopy and stent placement  Left 07/31/2012    Procedure: CYSTOSCOPY WITH LEFT RETROGRADE PYELOGRAM, LEFT URETEROSCOPY;  Surgeon: Mark C Ottelin, MD;  Location: [facility];  Service: Urology;  Laterality: Left;  . Cystoscopy w/ ureteral stent placement  02/2002    on left for ureteral stone  . Esophagogastroduodenoscopy N/A 09/14/2012    Procedure: ESOPHAGOGASTRODUODENOSCOPY (EGD);  Surgeon: Jay M Pyrtle, MD;  Location: [facility];  Service: Gastroenterology;  Laterality: N/A;    Current Outpatient Prescriptions  Medication Sig Dispense Refill  . carvedilol (COREG) 3.125 MG tablet Take 3.125 mg by mouth every morning.      . lisinopril (PRINIVIL,ZESTRIL) 2.5 MG tablet Take 2.5 mg by mouth every morning.      . nitroGLYCERIN (NITROSTAT) 0.4 MG SL tablet Place 0.4 mg under the tongue every 5 (five) minutes as needed for chest pain.      . pantoprazole (PROTONIX) 40 MG tablet Take 1 tablet (40 mg total) by mouth 2 (two) times daily before a meal.  60 tablet  5  . spironolactone (ALDACTONE) 50 MG tablet Take 25 mg by mouth every morning.      . tamsulosin (FLOMAX) 0.4 MG CAPS Take 0.4 mg by mouth daily.      . traMADol (ULTRAM) 50 MG tablet Take 50 mg by mouth every 6 (six) hours as needed for pain.       No current facility-administered medications for this visit.    No Known Allergies  Family History  Problem Relation Age of Onset  .   Colon cancer Neg Hx   . Heart attack Father   . Arthritis Mother     History  Substance Use Topics  . Smoking status: Former Smoker    Types: Cigarettes    Quit date: 07/27/1973  . Smokeless tobacco: Never Used  . Alcohol Use: No    ROS: As per history of present illness, otherwise negative  BP 110/70  Pulse 66  Ht 5' 7" (1.702 m)  Wt 141 lb (63.957 kg)  BMI 22.08 kg/m2 Constitutional: Well-developed and well-nourished. No distress. HEENT: Normocephalic and atraumatic. Oropharynx is clear and moist. No oropharyngeal exudate. Conjunctivae are normal.  No scleral  icterus. Cardiovascular: Normal rate, regular rhythm and intact distal pulses.  Pulmonary/chest: Effort normal and breath sounds normal. No wheezing, rales or rhonchi. Abdominal: Soft, nontender, nondistended. Bowel sounds active throughout. There are no masses palpable. No hepatosplenomegaly. Extremities: no clubbing, cyanosis, or edema Neurological: Alert and oriented to person place and time. Skin: Skin is warm and dry. No rashes noted. Psychiatric: Normal mood and affect. Behavior is normal.  RELEVANT LABS AND IMAGING: CBC    Component Value Date/Time   WBC 7.3 09/15/2012 0535   RBC 3.18* 09/15/2012 0535   HGB 9.7* 09/15/2012 0535   HCT 27.7* 09/15/2012 0535   PLT 150 09/15/2012 0535   MCV 87.1 09/15/2012 0535   MCH 30.5 09/15/2012 0535   MCHC 35.0 09/15/2012 0535   RDW 14.3 09/15/2012 0535    CMP     Component Value Date/Time   NA 137 09/15/2012 0535   K 4.4 09/15/2012 0535   CL 108 09/15/2012 0535   CO2 22 09/15/2012 0535   GLUCOSE 100* 09/15/2012 0535   BUN 20 09/15/2012 0535   CREATININE 1.47* 09/15/2012 0535   CALCIUM 8.6 09/15/2012 0535   PROT 6.2 11/21/2006 0332   ALBUMIN 3.5 11/21/2006 0332   AST 40* 11/21/2006 0332   ALT 20 11/21/2006 0332   ALKPHOS 90 11/21/2006 0332   BILITOT 0.6 11/21/2006 0332   GFRNONAA 42* 09/15/2012 0535   GFRAA 49* 09/15/2012 0535    ASSESSMENT/PLAN: 79 yo male with PMH of PUD with hospitalization and partial GOO symptoms in early July 2014, HTN, CAD, kidney stones who is seen in hospital followup.  1.  Gastric ulcer -- he has done very well clinically on twice daily PPI and Carafate. He has no symptoms today including no abdominal pain or evidence of gastric outlet obstruction. I feel at this point his Carafate can be discontinued, but I would like him to continue pantoprazole 40 mg twice daily for 3 months.  I agree with repeat upper endoscopy as scheduled in early September to document healing. There was some metaplasia surrounding this ulcer, and I would like to repeat  biopsies if the ulcer is still present. Hopefully it will have healed completely. He is to continue to avoid NSAIDs. He can liberalize his diet at this point. He just call me if he develops any problems prior to the upper endoscopy. He voices understanding.   

## 2012-10-27 ENCOUNTER — Other Ambulatory Visit: Payer: Self-pay | Admitting: Physician Assistant

## 2012-10-27 DIAGNOSIS — C44320 Squamous cell carcinoma of skin of unspecified parts of face: Secondary | ICD-10-CM | POA: Insufficient documentation

## 2012-10-27 HISTORY — PX: SKIN BIOPSY: SHX1

## 2012-11-14 ENCOUNTER — Ambulatory Visit (HOSPITAL_COMMUNITY)
Admission: RE | Admit: 2012-11-14 | Discharge: 2012-11-14 | Disposition: A | Discharge status: Home / Self Care | Payer: Medicare Other | Source: Ambulatory Visit | Attending: Internal Medicine | Admitting: Internal Medicine

## 2012-11-14 ENCOUNTER — Encounter (HOSPITAL_COMMUNITY): Payer: Self-pay | Admitting: *Deleted

## 2012-11-14 ENCOUNTER — Encounter (HOSPITAL_COMMUNITY)
Admission: RE | Disposition: A | Discharge status: Home / Self Care | Payer: Self-pay | Source: Ambulatory Visit | Attending: Internal Medicine

## 2012-11-14 DIAGNOSIS — I2510 Atherosclerotic heart disease of native coronary artery without angina pectoris: Secondary | ICD-10-CM | POA: Insufficient documentation

## 2012-11-14 DIAGNOSIS — I10 Essential (primary) hypertension: Secondary | ICD-10-CM | POA: Insufficient documentation

## 2012-11-14 DIAGNOSIS — Z87891 Personal history of nicotine dependence: Secondary | ICD-10-CM | POA: Insufficient documentation

## 2012-11-14 DIAGNOSIS — K259 Gastric ulcer, unspecified as acute or chronic, without hemorrhage or perforation: Secondary | ICD-10-CM

## 2012-11-14 DIAGNOSIS — I252 Old myocardial infarction: Secondary | ICD-10-CM | POA: Insufficient documentation

## 2012-11-14 DIAGNOSIS — K319 Disease of stomach and duodenum, unspecified: Secondary | ICD-10-CM | POA: Insufficient documentation

## 2012-11-14 DIAGNOSIS — Q391 Atresia of esophagus with tracheo-esophageal fistula: Secondary | ICD-10-CM | POA: Insufficient documentation

## 2012-11-14 DIAGNOSIS — IMO0002 Reserved for concepts with insufficient information to code with codable children: Secondary | ICD-10-CM

## 2012-11-14 DIAGNOSIS — Z87442 Personal history of urinary calculi: Secondary | ICD-10-CM | POA: Insufficient documentation

## 2012-11-14 DIAGNOSIS — Z79899 Other long term (current) drug therapy: Secondary | ICD-10-CM | POA: Insufficient documentation

## 2012-11-14 DIAGNOSIS — Z8711 Personal history of peptic ulcer disease: Secondary | ICD-10-CM | POA: Insufficient documentation

## 2012-11-14 DIAGNOSIS — K219 Gastro-esophageal reflux disease without esophagitis: Secondary | ICD-10-CM

## 2012-11-14 HISTORY — PX: ESOPHAGOGASTRODUODENOSCOPY: SHX5428

## 2012-11-14 SURGERY — EGD (ESOPHAGOGASTRODUODENOSCOPY)
Anesthesia: Moderate Sedation

## 2012-11-14 MED ORDER — FENTANYL CITRATE 0.05 MG/ML IJ SOLN
INTRAMUSCULAR | Status: AC
  Filled 2012-11-14: qty 2

## 2012-11-14 MED ORDER — FENTANYL CITRATE 0.05 MG/ML IJ SOLN
INTRAMUSCULAR | Status: DC | PRN
  Administered 2012-11-14 (×2): 25 ug via INTRAVENOUS

## 2012-11-14 MED ORDER — MIDAZOLAM HCL 10 MG/2ML IJ SOLN
INTRAMUSCULAR | Status: DC | PRN
  Administered 2012-11-14 (×2): 2 mg via INTRAVENOUS

## 2012-11-14 MED ORDER — SODIUM CHLORIDE 0.9 % IV SOLN: INTRAVENOUS | Status: DC

## 2012-11-14 MED ORDER — MIDAZOLAM HCL 10 MG/2ML IJ SOLN
INTRAMUSCULAR | Status: AC
  Filled 2012-11-14: qty 2

## 2012-11-14 MED ORDER — BUTAMBEN-TETRACAINE-BENZOCAINE 2-2-14 % EX AERO
INHALATION_SPRAY | CUTANEOUS | Status: DC | PRN
  Administered 2012-11-14: 2 via TOPICAL

## 2012-11-14 MED ORDER — PANTOPRAZOLE SODIUM 40 MG PO TBEC: 40.0000 mg | DELAYED_RELEASE_TABLET | Freq: Every day | ORAL | Status: DC

## 2012-11-14 NOTE — Interval H&P Note (Signed)
History and Physical Interval Note: Patient reports no interval change in abdominal symptoms. He reports he is eating well. No abdominal pain. No nausea or vomiting. The nature of the procedure, as well as the risks, benefits, and alternatives were carefully and thoroughly reviewed with the patient. Ample time for discussion and questions allowed. The patient understood, was satisfied, and agreed to proceed.     11/14/2012 8:04 AM  Eduardo Salazar  has presented today for surgery, with the diagnosis of ulcer  The various methods of treatment have been discussed with the patient and family. After consideration of risks, benefits and other options for treatment, the patient has consented to  Procedure(s): ESOPHAGOGASTRODUODENOSCOPY (EGD) (N/A) as a surgical intervention .  The patient's history has been reviewed, patient examined, no change in status, stable for surgery.  I have reviewed the patient's chart and labs.  Questions were answered to the patient's satisfaction.     Evamaria Detore M

## 2012-11-14 NOTE — H&P (View-Only) (Signed)
Patient ID: Eduardo Salazar, male   DOB: April 12, 1925, 79 y.o.   MRN: [id number] HPI: Eduardo Salazar is an 79 yo male with PMH of PUD with hospitalization and partial GOO symptoms in early July 2014, HTN, CAD, kidney stones who is seen in hospital followup. He is here today with his daughter. He is seen during hospitalization the first week of July 2014 and went for upper endoscopy on 09/14/2012 revealing a nonbleeding, deep and greater ulcer near the pyloric channel. This ulcer was creating significant edema and narrowing of the pyloric channel. He also had moderate reflux esophagitis in the mid and distal esophagus a moderate nonobstructing Schatzki's ring. He left the hospital on twice daily PPI and Carafate 4 times daily. He reports today that he is feeling very well. He denies abdominal pain. No nausea or vomiting. He is eating well. No dysphagia or odynophagia. No melena or rectal bleeding. No diarrhea or significant constipation. He has been avoiding foods such as chocolate, peppermint and caffeine. He's also tried to avoid spices such as black pepper. No heartburn.  Biopsies from the ulcer showed inflamed and ulcerated gastric type mucosa with goblet cell metaplasia. There was no evidence of H. pylori, dysplasia or malignancy.  He is scheduled to return the first week of September for surveillance endoscopy  Past Medical History  Diagnosis Date  . Arthritis   . Hypertension   . Myocardial infarction     mild"no symptoms" was told 2 months ago  . Pigmented skin lesions 07-27-12    generalized, never any skin cancer dx.  . Edema of extremities     lower extremities,feet; compression socks worned  . Kidney stones 07-27-12    hx. kidney stones, at present left ureteral stone  . Gastric ulcer 09/2012    Past Surgical History  Procedure Laterality Date  . Cataract extraction, bilateral    . Wrist surgery      "nerve repair"  . Cystoscopy with retrograde pyelogram, ureteroscopy and stent placement  Left 07/31/2012    Procedure: CYSTOSCOPY WITH LEFT RETROGRADE PYELOGRAM, LEFT URETEROSCOPY;  Surgeon: Garnett Farm, MD;  Location: [facility];  Service: Urology;  Laterality: Left;  . Cystoscopy w/ ureteral stent placement  02/2002    on left for ureteral stone  . Esophagogastroduodenoscopy N/A 09/14/2012    Procedure: ESOPHAGOGASTRODUODENOSCOPY (EGD);  Surgeon: Beverley Fiedler, MD;  [facility];  Service: Gastroenterology;  Laterality: N/A;    Current Outpatient Prescriptions  Medication Sig Dispense Refill  . carvedilol (COREG) 3.125 MG tablet Take 3.125 mg by mouth every morning.      Marland Kitchen lisinopril (PRINIVIL,ZESTRIL) 2.5 MG tablet Take 2.5 mg by mouth every morning.      . nitroGLYCERIN (NITROSTAT) 0.4 MG SL tablet Place 0.4 mg under the tongue every 5 (five) minutes as needed for chest pain.      . pantoprazole (PROTONIX) 40 MG tablet Take 1 tablet (40 mg total) by mouth 2 (two) times daily before a meal.  60 tablet  5  . spironolactone (ALDACTONE) 50 MG tablet Take 25 mg by mouth every morning.      . tamsulosin (FLOMAX) 0.4 MG CAPS Take 0.4 mg by mouth daily.      . traMADol (ULTRAM) 50 MG tablet Take 50 mg by mouth every 6 (six) hours as needed for pain.       No current facility-administered medications for this visit.    No Known Allergies  Family History  Problem Relation Age of Onset  .  Colon cancer Neg Hx   . Heart attack Father   . Arthritis Mother     History  Substance Use Topics  . Smoking status: Former Smoker    Types: Cigarettes    Quit date: 07/27/1973  . Smokeless tobacco: Never Used  . Alcohol Use: No    ROS: As per history of present illness, otherwise negative  BP 110/70  Pulse 66  Ht 5\' 7"  (1.702 m)  Wt 141 lb (63.957 kg)  BMI 22.08 kg/m2 Constitutional: Well-developed and well-nourished. No distress. HEENT: Normocephalic and atraumatic. Oropharynx is clear and moist. No oropharyngeal exudate. Conjunctivae are normal.  No scleral  icterus. Cardiovascular: Normal rate, regular rhythm and intact distal pulses.  Pulmonary/chest: Effort normal and breath sounds normal. No wheezing, rales or rhonchi. Abdominal: Soft, nontender, nondistended. Bowel sounds active throughout. There are no masses palpable. No hepatosplenomegaly. Extremities: no clubbing, cyanosis, or edema Neurological: Alert and oriented to person place and time. Skin: Skin is warm and dry. No rashes noted. Psychiatric: Normal mood and affect. Behavior is normal.  RELEVANT LABS AND IMAGING: CBC    Component Value Date/Time   WBC 7.3 09/15/2012 0535   RBC 3.18* 09/15/2012 0535   HGB 9.7* 09/15/2012 0535   HCT 27.7* 09/15/2012 0535   PLT 150 09/15/2012 0535   MCV 87.1 09/15/2012 0535   MCH 30.5 09/15/2012 0535   MCHC 35.0 09/15/2012 0535   RDW 14.3 09/15/2012 0535    CMP     Component Value Date/Time   NA 137 09/15/2012 0535   K 4.4 09/15/2012 0535   CL 108 09/15/2012 0535   CO2 22 09/15/2012 0535   GLUCOSE 100* 09/15/2012 0535   BUN 20 09/15/2012 0535   CREATININE 1.47* 09/15/2012 0535   CALCIUM 8.6 09/15/2012 0535   PROT 6.2 11/21/2006 0332   ALBUMIN 3.5 11/21/2006 0332   AST 40* 11/21/2006 0332   ALT 20 11/21/2006 0332   ALKPHOS 90 11/21/2006 0332   BILITOT 0.6 11/21/2006 0332   GFRNONAA 42* 09/15/2012 0535   GFRAA 49* 09/15/2012 0535    ASSESSMENT/PLAN: 79 yo male with PMH of PUD with hospitalization and partial GOO symptoms in early July 2014, HTN, CAD, kidney stones who is seen in hospital followup.  1.  Gastric ulcer -- he has done very well clinically on twice daily PPI and Carafate. He has no symptoms today including no abdominal pain or evidence of gastric outlet obstruction. I feel at this point his Carafate can be discontinued, but I would like him to continue pantoprazole 40 mg twice daily for 3 months.  I agree with repeat upper endoscopy as scheduled in early September to document healing. There was some metaplasia surrounding this ulcer, and I would like to repeat  biopsies if the ulcer is still present. Hopefully it will have healed completely. He is to continue to avoid NSAIDs. He can liberalize his diet at this point. He just call me if he develops any problems prior to the upper endoscopy. He voices understanding.

## 2012-11-14 NOTE — Op Note (Signed)
[facility] [address] [place], [address]   ENDOSCOPY PROCEDURE REPORT  PATIENT: Eduardo, Salazar  MR#: [id number] BIRTHDATE: February 03, 1926 , 87  yrs. old GENDER: Male ENDOSCOPIST: Beverley Fiedler, MD REFERRED BY: PROCEDURE DATE:  11/14/2012 PROCEDURE:  EGD, diagnostic ASA CLASS:     Class III INDICATIONS:  Surveillance of gastric ulcer and esophagitis (Last EGD 09/14/2012). MEDICATIONS: These medications were titrated to patient response per physician's verbal order, Fentanyl 50 mcg IV, and Versed 4 mg IV TOPICAL ANESTHETIC: Cetacaine Spray  DESCRIPTION OF PROCEDURE: After the risks benefits and alternatives of the procedure were thoroughly explained, informed consent was obtained.  The Pentax Gastroscope [id number] endoscope was introduced through the mouth and advanced to the second portion of the duodenum. Without limitations.  The instrument was slowly withdrawn as the mucosa was fully examined.   ESOPHAGUS: The mucosa of the esophagus appeared normal.  The previously seen esophagitis has healed.  A variable Z-line was observed 39 cm from the incisors.   Insignificant, nonobstructing Schatzki's ring.  STOMACH: The mucosa of the stomach appeared normal.  There was mild scarring in the pyloric channel but no evidence of residual ulceration or inflammation.  DUODENUM: The duodenal mucosa showed no abnormalities in the bulb and second portion of the duodenum.  There was mild scarring in the duodenal bulb very close to the pyloric channel without evidence of obstruction or significant narrowing.  Retroflexed views revealed no abnormalities.     The scope was then withdrawn from the patient and the procedure completed.  COMPLICATIONS: There were no complications.  ENDOSCOPIC IMPRESSION: 1.   The mucosa of the esophagus appeared normal 2.   The mucosa of the stomach appeared normal, Previously seen gastric ulcer has healed 3.   Mild deformity/scar at the  pyloric channel without significant narrowing 4.   The duodenal mucosa showed no other abnormalities in the bulb and second portion of the duodenum  RECOMMENDATIONS: 1.  Okay to reduce pantoprazole to 40 mg once daily.  This is best taken 30 minutes to one hour before the first meal the day 2.  Avoid NSAIDs 3.  Follow-up as needed  eSigned:  Beverley Fiedler, MD 11/14/2012 8:27 AM   XB:[id number] Selena Batten, MD and The Patient  PATIENT NAME:  Eduardo, Salazar MR#: [id number]

## 2012-11-15 ENCOUNTER — Encounter (HOSPITAL_COMMUNITY): Payer: Self-pay | Admitting: Internal Medicine

## 2012-11-28 HISTORY — PX: MOHS SURGERY: SUR867

## 2012-12-13 ENCOUNTER — Encounter: Payer: Self-pay | Admitting: Radiation Oncology

## 2012-12-13 NOTE — Progress Notes (Signed)
Histology and Location of Primary Skin Cancer: left temple  Patient presented with the following signs/symptoms: lesion   Past/Anticipated interventions by patient's surgeon/dermatologist for current problematic lesion, if any: 10/27/12 biopsy: Skin , (A) left forehead near frontal scalp, shave biopsy POORLY DIFFERENTIATED SQUAMOUS CELL CARCINOMA   11/28/12 Moh's surgery, 2 stages, split thickness skin graft - left forehead-invasive squamous cell carcinoma, poorly differentiated  Past skin cancers, if any:  None  1) Location/Histology/Intervention:   2) Location/Histology/Intervention:   3) Location/Histology/Intervention:   History of Blistering sunburns, if any:  No  SAFETY ISSUES:  Prior radiation? no  Pacemaker/ICD? no  Possible current pregnancy? na  Is the patient on methotrexate? no  Current Complaints / other details:  Married, retired, 3 children, 4 step children

## 2012-12-14 ENCOUNTER — Ambulatory Visit
Admission: RE | Admit: 2012-12-14 | Discharge: 2012-12-14 | Disposition: A | Discharge status: Home / Self Care | Payer: Medicare Other | Source: Ambulatory Visit | Attending: Radiation Oncology | Admitting: Radiation Oncology

## 2012-12-14 ENCOUNTER — Encounter: Payer: Self-pay | Admitting: Radiation Oncology

## 2012-12-14 VITALS — BP 110/52 | HR 61 | Temp 97.7°F | Ht 67.0 in | Wt 140.5 lb

## 2012-12-14 DIAGNOSIS — C44320 Squamous cell carcinoma of skin of unspecified parts of face: Secondary | ICD-10-CM | POA: Insufficient documentation

## 2012-12-14 DIAGNOSIS — I10 Essential (primary) hypertension: Secondary | ICD-10-CM | POA: Insufficient documentation

## 2012-12-14 DIAGNOSIS — Z79899 Other long term (current) drug therapy: Secondary | ICD-10-CM | POA: Insufficient documentation

## 2012-12-14 DIAGNOSIS — I252 Old myocardial infarction: Secondary | ICD-10-CM | POA: Insufficient documentation

## 2012-12-14 DIAGNOSIS — C4492 Squamous cell carcinoma of skin, unspecified: Secondary | ICD-10-CM

## 2012-12-14 NOTE — Progress Notes (Signed)
[facility] Radiation Oncology NEW PATIENT EVALUATION  Name: Eduardo Salazar MRN: [id number]  Date:   12/14/2012           DOB: 1925/04/16  Status: outpatient   CC: Pearson Grippe, MD  Herma Mering, MD    REFERRING PHYSICIAN: Herma Mering, MD   DIAGNOSIS: Squamous cell carcinoma of the skin, left temple   HISTORY OF PRESENT ILLNESS:  Eduardo Salazar is a 79 y.o. male who is seen today for the courtesy of Dr. Park Liter for consideration of postoperative radiation therapy in the management of his poorly differentiated squamous cell carcinoma of the skin arising from the left temple. He states that he first developed a "sore" along his left temple approximately 4-5 months ago. He was seen by Dr. Terri Piedra who performed a biopsy on 10/27/2012 which was diagnostic for well-differentiated squamous cell carcinoma. The patient was referred to Dr. Park Liter for Mohs surgery. He underwent a two-stage procedure on 11/28/2012. There was perineural invasion measuring 0.8 mm. He was closed with a split thickness skin graft from the left upper neck. He is doing well postoperatively. He was seen by Dr. Park Liter on 12/12/2012 and felt to have a viable graft. He is without complaints today.  PREVIOUS RADIATION THERAPY: No   PAST MEDICAL HISTORY:  has a past medical history of Arthritis; Hypertension; Myocardial infarction; Pigmented skin lesions (07-27-12); Edema of extremities; Kidney stones (07-27-12); Gastric ulcer (09/2012); Skin cancer (10/27/12); CHF (congestive heart failure); Renal insufficiency; and Squamous cell skin cancer (10/27/2012).     PAST SURGICAL HISTORY:  Past Surgical History  Procedure Laterality Date  . Cataract extraction, bilateral    . Wrist surgery      "nerve repair"  . Cystoscopy with retrograde pyelogram, ureteroscopy and stent placement Left 07/31/2012    Procedure: CYSTOSCOPY WITH LEFT RETROGRADE PYELOGRAM, LEFT URETEROSCOPY;  Surgeon: Garnett Farm, MD;   Location: [facility];  Service: Urology;  Laterality: Left;  . Cystoscopy w/ ureteral stent placement  02/2002    on left for ureteral stone  . Esophagogastroduodenoscopy N/A 09/14/2012    Procedure: ESOPHAGOGASTRODUODENOSCOPY (EGD);  Surgeon: Beverley Fiedler, MD;  Location: [facility] ENDOSCOPY;  Service: Gastroenterology;  Laterality: N/A;  . Appendectomy    . Eye surgery      bilateral catracts  . Esophagogastroduodenoscopy N/A 11/14/2012    Procedure: ESOPHAGOGASTRODUODENOSCOPY (EGD);  Surgeon: Beverley Fiedler, MD;  Location: [facility] ENDOSCOPY;  Service: Gastroenterology;  Laterality: N/A;  . Mohs surgery Left 11/28/12    forehead  . Skin biopsy Left 10/27/12    left forehead near frontal scalp  . Vasectomy       FAMILY HISTORY: family history includes Arthritis in his mother; Heart attack in his father. There is no history of Colon cancer. his father died at age 39 and his mother died at 48.   SOCIAL HISTORY:  reports that he quit smoking about 39 years ago. His smoking use included Cigarettes. He has a 11 pack-year smoking history. He has never used smokeless tobacco. He reports that he does not drink alcohol or use illicit drugs. Married, 3 daughters and 4 stepdaughters. He lives with his son and stepdaughter. 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   ALLERGIES: Review of patient's allergies indicates no known allergies.   MEDICATIONS:  Current Outpatient Prescriptions  Medication Sig Dispense Refill  . carvedilol (COREG) 3.125 MG tablet Take 3.125 mg  by mouth every morning.      . finasteride (PROSCAR) 5 MG tablet Take 50 mg by mouth.      . pantoprazole (PROTONIX) 40 MG tablet Take 1 tablet (40 mg total) by mouth daily.  90 tablet  3  . spironolactone (ALDACTONE) 50 MG tablet Take 25 mg by mouth every morning.      . tamsulosin (FLOMAX) 0.4 MG CAPS Take 0.4 mg by mouth daily.       No current facility-administered medications  for this encounter.     REVIEW OF SYSTEMS:  Pertinent items are noted in HPI.    PHYSICAL EXAM:  height is 5\' 7"  (1.702 m) and weight is 140 lb 8 oz (63.73 kg). His temperature is 97.7 F (36.5 C). His blood pressure is 110/52 and his pulse is 61.   Alert and oriented 79 year old white male appearing his stated age. Nodes: There is no palpable periaricular, submandibular, or cervical lymphadenopathy. On inspection of the left temple there is a 4 x 4 centimeter wound covered by a skin graft. He appears to be healing well. There is no evidence for recurrent disease.   LABORATORY DATA:  Lab Results  Component Value Date   WBC 7.3 09/15/2012   HGB 9.7* 09/15/2012   HCT 27.7* 09/15/2012   MCV 87.1 09/15/2012   PLT 150 09/15/2012   Lab Results  Component Value Date   NA 137 09/15/2012   K 4.4 09/15/2012   CL 108 09/15/2012   CO2 22 09/15/2012   Lab Results  Component Value Date   ALT 20 11/21/2006   AST 40* 11/21/2006   ALKPHOS 90 11/21/2006   BILITOT 0.6 11/21/2006      IMPRESSION: Poorly differentiated squamous cell carcinoma the skin arising from the left temple. Risk factors for local recurrence include tumor differentiation and degree of perineural invasion. Literature suggest a breakpoint nerve diameter of 0.1 mm for increased risk for local recurrence, and while he does not reach that breakpoint, he does have poorly differentiated pathology which together would suggest an increased risk for local recurrence and benefit for postoperative radiation therapy. This was discussed in detail with the patient and his stepdaughter. Current 2014 NCCN guidelines recommend and adjuvant radiation therapy dose of 5000 cGy in 20 sessions over a period of 4 weeks. Consent is signed today.   PLAN: As discussed above.   I spent 30 minutes minutes face to face with the patient and more than 50% of that time was spent in counseling and/or coordination of care.

## 2012-12-14 NOTE — Progress Notes (Signed)
Please see the Nurse Progress Note in the MD Initial Consult Encounter for this patient. 

## 2012-12-14 NOTE — Progress Notes (Signed)
Eduardo Salazar has skin cancer on his left temple which is covered with a dressing.  He denies any pain presently, but does report intermittent pain.  He denies any other skin cancers in the past.  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

## 2012-12-15 NOTE — Addendum Note (Signed)
Encounter addended by: Tessa Lerner, RN on: 12/15/2012  2:59 PM<BR>     Documentation filed: Charges VN

## 2012-12-28 NOTE — Progress Notes (Signed)
[facility] Psychosocial Distress Screening Clinical Social Work  Clinical Social Work was referred by distress screening protocol.  The patient scored a 5 on the Psychosocial Distress Thermometer which indicates moderate distress. Clinical Social Worker Intern telephoned to assess for distress and other psychosocial needs. Clinical Social Worker Intern spoke with Patient's stepdaughter and she put Patient on speaker phone.  Patient conveyed he was doing "okay".  Patient's stepdaughter stated that Patient was doing fine.  Clinical Social Worker Intern encouraged Patient and Patient's stepdaughter to contact Social Work if any further assistance was needed.   Clinical Social Worker follow up needed: no  If yes, follow up plan:   Camyah Pultz S. [facility] Clinical Social Work Intern [employer] [phone]

## 2013-01-08 ENCOUNTER — Ambulatory Visit
Admission: RE | Admit: 2013-01-08 | Discharge: 2013-01-08 | Disposition: A | Discharge status: Home / Self Care | Payer: Medicare Other | Source: Ambulatory Visit | Attending: Radiation Oncology | Admitting: Radiation Oncology

## 2013-01-08 ENCOUNTER — Other Ambulatory Visit: Payer: Self-pay

## 2013-01-08 DIAGNOSIS — C44320 Squamous cell carcinoma of skin of unspecified parts of face: Secondary | ICD-10-CM

## 2013-01-08 DIAGNOSIS — Z510 Encounter for antineoplastic radiation therapy: Secondary | ICD-10-CM | POA: Insufficient documentation

## 2013-01-08 DIAGNOSIS — L539 Erythematous condition, unspecified: Secondary | ICD-10-CM | POA: Insufficient documentation

## 2013-01-08 NOTE — Progress Notes (Signed)
Complex simulation/treatment planning note: The patient had construction of a head cast in the CT simulation suite. He was then taken to the [facility] where he was set up to LAO to his left temple. One block is constructed to conform the the field of irradiation. A special port plan is requested. I prescribing 5000 cGy in 20 sessions utilizing 6 MEV electrons. He'll have obstruction of 0.8 cm custom bolus on the first day of his treatment. This will be applied daily.

## 2013-01-15 ENCOUNTER — Encounter: Payer: Self-pay | Admitting: Radiation Oncology

## 2013-01-15 ENCOUNTER — Ambulatory Visit
Admission: RE | Admit: 2013-01-15 | Discharge: 2013-01-15 | Disposition: A | Discharge status: Home / Self Care | Payer: Medicare Other | Source: Ambulatory Visit | Attending: Radiation Oncology | Admitting: Radiation Oncology

## 2013-01-15 VITALS — BP 103/61 | HR 58 | Temp 97.7°F | Resp 20 | Wt 146.3 lb

## 2013-01-15 DIAGNOSIS — C44320 Squamous cell carcinoma of skin of unspecified parts of face: Secondary | ICD-10-CM

## 2013-01-15 NOTE — Progress Notes (Addendum)
Post sim ed completed w/pt and 2 family;y members. Family asking if pt may apply Aloe gel to left temple; advised pt discuss w/Dr Dayton Scrape. Gave pt "Radiation and You" booklet w/all pertinent information marked and discussed, re: fatigue, skin irritation/care, hair loss, hair care, nutrition, pain. All questions answered; family will discuss use of Aloe w/dr.  Pt denies pain, fatigue, loss of appetite.

## 2013-01-15 NOTE — Progress Notes (Signed)
Please see dictated note from earlier today. 

## 2013-01-15 NOTE — Progress Notes (Signed)
Weekly Management Note:  Site: Left temple Current Dose:  250  cGy Projected Dose: Thousand  cGy  Narrative: The patient is seen today for routine under treatment assessment. CBCT/MVCT images/port films were reviewed. The chart was reviewed.   No complaints today. He was given skin care instructions today.  Physical Examination: There were no vitals filed for this visit..  Weight:  . No significant skin changes.  Impression: Tolerating radiation therapy well.  Plan: Continue radiation therapy as planned.

## 2013-01-16 ENCOUNTER — Ambulatory Visit
Admission: RE | Admit: 2013-01-16 | Discharge: 2013-01-16 | Disposition: A | Discharge status: Home / Self Care | Payer: Medicare Other | Source: Ambulatory Visit | Attending: Radiation Oncology | Admitting: Radiation Oncology

## 2013-01-17 ENCOUNTER — Ambulatory Visit
Admission: RE | Admit: 2013-01-17 | Discharge: 2013-01-17 | Disposition: A | Discharge status: Home / Self Care | Payer: Medicare Other | Source: Ambulatory Visit | Attending: Radiation Oncology | Admitting: Radiation Oncology

## 2013-01-18 ENCOUNTER — Ambulatory Visit
Admission: RE | Admit: 2013-01-18 | Discharge: 2013-01-18 | Disposition: A | Discharge status: Home / Self Care | Payer: Medicare Other | Source: Ambulatory Visit | Attending: Radiation Oncology | Admitting: Radiation Oncology

## 2013-01-19 ENCOUNTER — Ambulatory Visit
Admission: RE | Admit: 2013-01-19 | Discharge: 2013-01-19 | Disposition: A | Discharge status: Home / Self Care | Payer: Medicare Other | Source: Ambulatory Visit | Attending: Radiation Oncology | Admitting: Radiation Oncology

## 2013-01-22 ENCOUNTER — Ambulatory Visit
Admission: RE | Admit: 2013-01-22 | Discharge: 2013-01-22 | Disposition: A | Discharge status: Home / Self Care | Payer: Medicare Other | Source: Ambulatory Visit | Attending: Radiation Oncology | Admitting: Radiation Oncology

## 2013-01-23 ENCOUNTER — Ambulatory Visit
Admission: RE | Admit: 2013-01-23 | Discharge: 2013-01-23 | Disposition: A | Discharge status: Home / Self Care | Payer: Medicare Other | Source: Ambulatory Visit | Attending: Radiation Oncology | Admitting: Radiation Oncology

## 2013-01-23 ENCOUNTER — Encounter: Payer: Self-pay | Admitting: Radiation Oncology

## 2013-01-23 VITALS — BP 113/69 | HR 57 | Temp 97.5°F | Resp 20 | Wt 146.3 lb

## 2013-01-23 DIAGNOSIS — C44320 Squamous cell carcinoma of skin of unspecified parts of face: Secondary | ICD-10-CM

## 2013-01-23 NOTE — Progress Notes (Signed)
Pt denies pain, fatigue, loss of appetite. His left temple is hyperpigmented, no desquamation.

## 2013-01-23 NOTE — Progress Notes (Signed)
Weekly Management Note:  Site: Left temple Current Dose:  1750  cGy Projected Dose: 5000  cGy  Narrative: The patient is seen today for routine under treatment assessment. CBCT/MVCT images/port films were reviewed. The chart was reviewed.   No complaints today.  Physical Examination:  Filed Vitals:   01/23/13 0834  BP: 113/69  Pulse: 57  Temp: 97.5 F (36.4 C)  Resp: 20  .  Weight: 146 lb 4.8 oz (66.361 kg). There is slight erythema/hyperpigmentation the skin along his treatment field/left temple. No areas of desquamation.  Impression: Tolerating radiation therapy well.  Plan: Continue radiation therapy as planned.

## 2013-01-24 ENCOUNTER — Ambulatory Visit
Admission: RE | Admit: 2013-01-24 | Discharge: 2013-01-24 | Disposition: A | Discharge status: Home / Self Care | Payer: Medicare Other | Source: Ambulatory Visit | Attending: Radiation Oncology | Admitting: Radiation Oncology

## 2013-01-25 ENCOUNTER — Ambulatory Visit
Admission: RE | Admit: 2013-01-25 | Discharge: 2013-01-25 | Disposition: A | Discharge status: Home / Self Care | Payer: Medicare Other | Source: Ambulatory Visit | Attending: Radiation Oncology | Admitting: Radiation Oncology

## 2013-01-26 ENCOUNTER — Ambulatory Visit
Admission: RE | Admit: 2013-01-26 | Discharge: 2013-01-26 | Disposition: A | Discharge status: Home / Self Care | Payer: Medicare Other | Source: Ambulatory Visit | Attending: Radiation Oncology | Admitting: Radiation Oncology

## 2013-01-29 ENCOUNTER — Ambulatory Visit
Admission: RE | Admit: 2013-01-29 | Discharge: 2013-01-29 | Disposition: A | Discharge status: Home / Self Care | Payer: Medicare Other | Source: Ambulatory Visit | Attending: Radiation Oncology | Admitting: Radiation Oncology

## 2013-01-29 ENCOUNTER — Encounter: Payer: Self-pay | Admitting: Radiation Oncology

## 2013-01-29 VITALS — BP 134/63 | HR 62 | Temp 97.5°F | Resp 20 | Wt 145.6 lb

## 2013-01-29 DIAGNOSIS — C44320 Squamous cell carcinoma of skin of unspecified parts of face: Secondary | ICD-10-CM

## 2013-01-29 NOTE — Progress Notes (Signed)
Weekly Management Note:  Site: Left temple Current Dose:  2750  cGy Projected Dose: 5  cGy  Narrative: The patient is seen today for routine under treatment assessment. CBCT/MVCT images/port films were reviewed. The chart was reviewed.   He is without complaints today. He has Radioplex gel to use when necessary.  Physical Examination:  Filed Vitals:   01/29/13 0837  BP: 134/63  Pulse: 62  Temp: 97.5 F (36.4 C)  Resp: 20  .  Weight: 145 lb 9.6 oz (66.044 kg). There is moderate erythema and hyperpigmentation the skin within his treatment field along the left temple.  Impression: Tolerating radiation therapy well.  Plan: Continue radiation therapy as planned.

## 2013-01-29 NOTE — Progress Notes (Signed)
Pt denies pain, skin irritation of left temple, fatigue, loss of appetite. His left temple is slightly red, dry.

## 2013-01-30 ENCOUNTER — Ambulatory Visit
Admission: RE | Admit: 2013-01-30 | Discharge: 2013-01-30 | Disposition: A | Discharge status: Home / Self Care | Payer: Medicare Other | Source: Ambulatory Visit | Attending: Radiation Oncology | Admitting: Radiation Oncology

## 2013-01-31 ENCOUNTER — Ambulatory Visit
Admission: RE | Admit: 2013-01-31 | Discharge: 2013-01-31 | Disposition: A | Discharge status: Home / Self Care | Payer: Medicare Other | Source: Ambulatory Visit | Attending: Radiation Oncology | Admitting: Radiation Oncology

## 2013-02-01 ENCOUNTER — Ambulatory Visit
Admission: RE | Admit: 2013-02-01 | Discharge: 2013-02-01 | Disposition: A | Discharge status: Home / Self Care | Payer: Medicare Other | Source: Ambulatory Visit | Attending: Radiation Oncology | Admitting: Radiation Oncology

## 2013-02-02 ENCOUNTER — Ambulatory Visit
Admission: RE | Admit: 2013-02-02 | Discharge: 2013-02-02 | Disposition: A | Discharge status: Home / Self Care | Payer: Medicare Other | Source: Ambulatory Visit | Attending: Radiation Oncology | Admitting: Radiation Oncology

## 2013-02-05 ENCOUNTER — Ambulatory Visit
Admission: RE | Admit: 2013-02-05 | Discharge: 2013-02-05 | Disposition: A | Discharge status: Home / Self Care | Payer: Medicare Other | Source: Ambulatory Visit | Attending: Radiation Oncology | Admitting: Radiation Oncology

## 2013-02-05 ENCOUNTER — Encounter: Payer: Self-pay | Admitting: Radiation Oncology

## 2013-02-05 VITALS — BP 127/76 | HR 55 | Temp 97.5°F | Resp 20 | Wt 145.9 lb

## 2013-02-05 DIAGNOSIS — C44320 Squamous cell carcinoma of skin of unspecified parts of face: Secondary | ICD-10-CM

## 2013-02-05 NOTE — Progress Notes (Signed)
Pt denies pain, loss of appetite, skin irritation. He is fatigued. He is applying Radiaplex to left temple for hyperpigmentation.

## 2013-02-05 NOTE — Progress Notes (Signed)
Weekly Management Note:  Site: Left temple Current Dose:  4000  cGy Projected Dose: 5000  cGy  Narrative: The patient is seen today for routine under treatment assessment. CBCT/MVCT images/port films were reviewed. The chart was reviewed.   He is without complaints today. He uses Radioplex gel when necessary.  Physical Examination:  Filed Vitals:   02/05/13 0847  BP: 127/76  Pulse: 55  Temp: 97.5 F (36.4 C)  Resp: 20  .  Weight: 145 lb 14.4 oz (66.18 kg). There is hyperpigmentation the skin along his left temple/treatment field. No areas of desquamation.  Impression: Tolerating radiation therapy well.  Plan: Continue radiation therapy as planned.

## 2013-02-06 ENCOUNTER — Ambulatory Visit
Admission: RE | Admit: 2013-02-06 | Discharge: 2013-02-06 | Disposition: A | Discharge status: Home / Self Care | Payer: Medicare Other | Source: Ambulatory Visit | Attending: Radiation Oncology | Admitting: Radiation Oncology

## 2013-02-07 ENCOUNTER — Ambulatory Visit
Admission: RE | Admit: 2013-02-07 | Discharge: 2013-02-07 | Disposition: A | Discharge status: Home / Self Care | Payer: Medicare Other | Source: Ambulatory Visit | Attending: Radiation Oncology | Admitting: Radiation Oncology

## 2013-02-12 ENCOUNTER — Ambulatory Visit
Admission: RE | Admit: 2013-02-12 | Discharge: 2013-02-12 | Disposition: A | Discharge status: Home / Self Care | Payer: Medicare Other | Source: Ambulatory Visit | Attending: Radiation Oncology | Admitting: Radiation Oncology

## 2013-02-13 ENCOUNTER — Encounter: Payer: Self-pay | Admitting: Radiation Oncology

## 2013-02-13 ENCOUNTER — Ambulatory Visit
Admission: RE | Admit: 2013-02-13 | Discharge: 2013-02-13 | Disposition: A | Discharge status: Home / Self Care | Payer: Medicare Other | Source: Ambulatory Visit | Attending: Radiation Oncology | Admitting: Radiation Oncology

## 2013-02-13 VITALS — BP 115/69 | HR 63 | Temp 97.6°F | Resp 20 | Wt 146.6 lb

## 2013-02-13 DIAGNOSIS — C44320 Squamous cell carcinoma of skin of unspecified parts of face: Secondary | ICD-10-CM

## 2013-02-13 NOTE — Progress Notes (Signed)
[facility] Radiation Oncology End of Treatment Note  Name:Eduardo Salazar  Date: 02/13/2013 ZOX:[id number] DOB:Dec 26, 1925   Status:outpatient    CC: Pearson Grippe, MD  , Dr. Herma Mering  REFERRING PHYSICIAN:  Dr. Herma Mering   DIAGNOSIS: Squamous cell carcinoma the skin, left temple, perineural invasion   INDICATION FOR TREATMENT: Curative, postop   TREATMENT DATES: 01/15/2013 through 02/13/2013                          SITE/DOSE:  Left temple 5000 cGy in 20 sessions                          BEAMS/ENERGY:  6 MEV electrons delivered en face with 0.8 cm custom bolus to maximize the dose to the skin surface                 NARRATIVE:  [name]  tolerated treatment well with the expected degree of hyperpigmentation the skin and dry desquamation by completion of therapy.   He used Radioplex gel during his course of treatment.                      PLAN: Routine followup in one month. Patient instructed to call if questions or worsening complaints in interim.

## 2013-02-13 NOTE — Progress Notes (Signed)
Clinic note: The patient is seen today after completion of his last fraction of radiation therapy to his left temple. He is without complaints today. He uses Radioplex gel.   On examination there is hyperpigmentation the skin with patchy dry desquamation. There is no palpable adenopathy.  Impression: Satisfactory progress.  Plan: Followup visit with me in one month.

## 2013-02-13 NOTE — Progress Notes (Signed)
Please see dictated note from earlier today. 

## 2013-02-13 NOTE — Progress Notes (Signed)
Pt denies pain, loss of appetite; he is fatigued. Pt has not been applying lotion to left temple; advised he may. His temple has slight amount dry desquamation, hyperpigmentation. Gave pt 1 month FU card.

## 2013-03-12 ENCOUNTER — Encounter: Payer: Self-pay | Admitting: *Deleted

## 2013-03-27 ENCOUNTER — Ambulatory Visit: Payer: Medicare Other | Admitting: Radiation Oncology

## 2013-04-11 ENCOUNTER — Encounter: Payer: Self-pay | Admitting: Radiation Oncology

## 2013-04-11 ENCOUNTER — Ambulatory Visit
Admission: RE | Admit: 2013-04-11 | Discharge: 2013-04-11 | Disposition: A | Discharge status: Home / Self Care | Payer: Medicare Other | Source: Ambulatory Visit | Attending: Radiation Oncology | Admitting: Radiation Oncology

## 2013-04-11 VITALS — BP 135/68 | HR 61 | Temp 97.4°F | Resp 20 | Wt 146.8 lb

## 2013-04-11 DIAGNOSIS — C44320 Squamous cell carcinoma of skin of unspecified parts of face: Secondary | ICD-10-CM

## 2013-04-11 NOTE — Progress Notes (Signed)
CC: Dr. Janann August  Followup note: [name] returns today almost 2 months following completion of postoperative electron beam radiation therapy to the left temple in the management of his squamous cell carcinoma the skin with perineural invasion. He is without complaints today. He tells me he sees Eduardo Salazar and his team at regular intervals. He has not yet seen Eduardo Salazar, and expects to see him this spring.  Physical examination: Alert and oriented. Filed Vitals:   04/11/13 1036  BP: 135/68  Pulse: 61  Temp: 97.4 F (36.3 C)  Resp: 20   On inspection of the left temple there is patchy dry desquamation the skin with no visible or palpable evidence for recurrent disease. There is no palpable periaricular, facial, parotid, submandibular, or cervical lymphadenopathy.  Impression: Satisfactory progress.  Plan: Followup through Eduardo Salazar and Eduardo Salazar. I've not scheduled the patient for a formal followup visit, and I can see him in the future should the need arise.

## 2013-04-11 NOTE — Progress Notes (Signed)
Follow up  Left temple rad txs 01/15/13-02/13/13, skin well healed, no desquamation, not using cream any more, appetite good, no nausea or head aches, walks with 4 prong cane,slight unsteady, no c/o pain 10:37 AM

## 2013-07-12 ENCOUNTER — Other Ambulatory Visit: Payer: Self-pay | Admitting: Internal Medicine

## 2013-07-12 DIAGNOSIS — R270 Ataxia, unspecified: Secondary | ICD-10-CM

## 2013-07-16 ENCOUNTER — Ambulatory Visit
Admission: RE | Admit: 2013-07-16 | Discharge: 2013-07-16 | Disposition: A | Discharge status: Home / Self Care | Payer: Medicare Other | Source: Ambulatory Visit | Attending: Internal Medicine | Admitting: Internal Medicine

## 2013-07-16 DIAGNOSIS — R270 Ataxia, unspecified: Secondary | ICD-10-CM

## 2013-07-17 ENCOUNTER — Other Ambulatory Visit: Payer: Self-pay | Admitting: Physician Assistant

## 2014-05-08 ENCOUNTER — Ambulatory Visit (INDEPENDENT_AMBULATORY_CARE_PROVIDER_SITE_OTHER): Payer: Medicare Other | Admitting: Podiatry

## 2014-05-08 ENCOUNTER — Encounter: Payer: Self-pay | Admitting: Podiatry

## 2014-05-08 VITALS — BP 116/68 | HR 69 | Resp 18

## 2014-05-08 DIAGNOSIS — B351 Tinea unguium: Secondary | ICD-10-CM

## 2014-05-08 DIAGNOSIS — G629 Polyneuropathy, unspecified: Secondary | ICD-10-CM | POA: Diagnosis not present

## 2014-05-08 DIAGNOSIS — M79676 Pain in unspecified toe(s): Secondary | ICD-10-CM

## 2014-05-08 NOTE — Patient Instructions (Signed)

## 2014-05-08 NOTE — Progress Notes (Signed)
   Subjective:    Patient ID: Eduardo Salazar, male    DOB: [birth year], 81 y.o.   MRN: [id number]  HPI  81 year old male presents the office today with his daughter with complaints of painful, thick, discolored toenails which is been ongoing for several years. The patient's daughter states that she is unable to trim the nails herself. She denies any redness or drainage from the nail sites. No other complaints at this time.   Review of Systems  All other systems reviewed and are negative.      Objective:   Physical Exam Awake, alert, NAD DP/PT pulses 1/4 bilaterally, CRT less than 3 seconds Protective sensation decreased with Simms Weinstein monofilament, decreased vibratory sensation. Achilles tendon reflex appears to be intact. Nails are hypertrophic, dystrophic, elongated, brittle, discolored 10. There is no swelling erythema or drainage on the nail sites. There does appear to be subjective tenderness along the nails 1-5 bilaterally. No other areas of tenderness to bilateral lower externus. No overlying edema, erythema, increased warmth. No open lesions or pre-ulcer lesions identified. No interdigital maceration. No pain with calf compression, swelling, warmth, erythema.     Assessment & Plan:  81 year old male symptomatic onychomycosis, neuropathy -Treatment options were discussed including alternatives, risks, complications. -Nail sharply debrided 10 without complications/bleeding. -Patient's daughter was inquiring about possible neuropathy treatments. Recommended and did discuss with primary care doctor. There is not appear to be any pain associated with the neuropathy although he does have decreased sensation with Derrel Nip monofilament and vibratory sensation bilaterally. -Follow-up in 3 months or sooner if any problems are to arise. In the meantime, encouraged call the office with any questions, concerns, changes symptoms.

## 2014-05-09 ENCOUNTER — Encounter: Payer: Self-pay | Admitting: Podiatry

## 2014-05-26 ENCOUNTER — Observation Stay (HOSPITAL_COMMUNITY)
Admission: EM | Admit: 2014-05-26 | Discharge: 2014-05-28 | Payer: Medicare Other | Attending: Internal Medicine | Admitting: Internal Medicine

## 2014-05-26 ENCOUNTER — Emergency Department (HOSPITAL_COMMUNITY): Payer: Medicare Other

## 2014-05-26 ENCOUNTER — Encounter (HOSPITAL_COMMUNITY): Payer: Self-pay | Admitting: *Deleted

## 2014-05-26 DIAGNOSIS — N400 Enlarged prostate without lower urinary tract symptoms: Secondary | ICD-10-CM | POA: Diagnosis not present

## 2014-05-26 DIAGNOSIS — R112 Nausea with vomiting, unspecified: Secondary | ICD-10-CM | POA: Insufficient documentation

## 2014-05-26 DIAGNOSIS — Z85828 Personal history of other malignant neoplasm of skin: Secondary | ICD-10-CM | POA: Insufficient documentation

## 2014-05-26 DIAGNOSIS — N183 Chronic kidney disease, stage 3 unspecified: Secondary | ICD-10-CM | POA: Diagnosis present

## 2014-05-26 DIAGNOSIS — I2510 Atherosclerotic heart disease of native coronary artery without angina pectoris: Secondary | ICD-10-CM | POA: Diagnosis not present

## 2014-05-26 DIAGNOSIS — J189 Pneumonia, unspecified organism: Secondary | ICD-10-CM | POA: Insufficient documentation

## 2014-05-26 DIAGNOSIS — J449 Chronic obstructive pulmonary disease, unspecified: Secondary | ICD-10-CM | POA: Diagnosis not present

## 2014-05-26 DIAGNOSIS — S22080A Wedge compression fracture of T11-T12 vertebra, initial encounter for closed fracture: Secondary | ICD-10-CM | POA: Diagnosis present

## 2014-05-26 DIAGNOSIS — R3919 Other difficulties with micturition: Secondary | ICD-10-CM | POA: Diagnosis not present

## 2014-05-26 DIAGNOSIS — M4854XA Collapsed vertebra, not elsewhere classified, thoracic region, initial encounter for fracture: Secondary | ICD-10-CM

## 2014-05-26 DIAGNOSIS — R140 Abdominal distension (gaseous): Secondary | ICD-10-CM | POA: Diagnosis not present

## 2014-05-26 DIAGNOSIS — K5900 Constipation, unspecified: Secondary | ICD-10-CM | POA: Diagnosis not present

## 2014-05-26 DIAGNOSIS — R0600 Dyspnea, unspecified: Secondary | ICD-10-CM

## 2014-05-26 DIAGNOSIS — I252 Old myocardial infarction: Secondary | ICD-10-CM | POA: Insufficient documentation

## 2014-05-26 DIAGNOSIS — Z9842 Cataract extraction status, left eye: Secondary | ICD-10-CM | POA: Insufficient documentation

## 2014-05-26 DIAGNOSIS — I5032 Chronic diastolic (congestive) heart failure: Secondary | ICD-10-CM | POA: Insufficient documentation

## 2014-05-26 DIAGNOSIS — I129 Hypertensive chronic kidney disease with stage 1 through stage 4 chronic kidney disease, or unspecified chronic kidney disease: Secondary | ICD-10-CM | POA: Diagnosis not present

## 2014-05-26 DIAGNOSIS — Z87891 Personal history of nicotine dependence: Secondary | ICD-10-CM | POA: Diagnosis not present

## 2014-05-26 DIAGNOSIS — N401 Enlarged prostate with lower urinary tract symptoms: Secondary | ICD-10-CM | POA: Insufficient documentation

## 2014-05-26 DIAGNOSIS — Z8719 Personal history of other diseases of the digestive system: Secondary | ICD-10-CM | POA: Diagnosis not present

## 2014-05-26 DIAGNOSIS — Z9049 Acquired absence of other specified parts of digestive tract: Secondary | ICD-10-CM | POA: Diagnosis not present

## 2014-05-26 DIAGNOSIS — Y92091 Bathroom in other non-institutional residence as the place of occurrence of the external cause: Secondary | ICD-10-CM | POA: Insufficient documentation

## 2014-05-26 DIAGNOSIS — R531 Weakness: Secondary | ICD-10-CM | POA: Diagnosis present

## 2014-05-26 DIAGNOSIS — S22089A Unspecified fracture of T11-T12 vertebra, initial encounter for closed fracture: Principal | ICD-10-CM | POA: Insufficient documentation

## 2014-05-26 DIAGNOSIS — Z87442 Personal history of urinary calculi: Secondary | ICD-10-CM | POA: Diagnosis not present

## 2014-05-26 DIAGNOSIS — W19XXXA Unspecified fall, initial encounter: Secondary | ICD-10-CM | POA: Insufficient documentation

## 2014-05-26 DIAGNOSIS — G629 Polyneuropathy, unspecified: Secondary | ICD-10-CM | POA: Diagnosis not present

## 2014-05-26 DIAGNOSIS — K219 Gastro-esophageal reflux disease without esophagitis: Secondary | ICD-10-CM

## 2014-05-26 DIAGNOSIS — Z9841 Cataract extraction status, right eye: Secondary | ICD-10-CM | POA: Diagnosis not present

## 2014-05-26 DIAGNOSIS — R109 Unspecified abdominal pain: Secondary | ICD-10-CM

## 2014-05-26 HISTORY — DX: Benign prostatic hyperplasia without lower urinary tract symptoms: N40.0

## 2014-05-26 LAB — BASIC METABOLIC PANEL
ANION GAP: 8 (ref 5–15)
BUN: 19 mg/dL (ref 6–23)
CO2: 24 mmol/L (ref 19–32)
CREATININE: 1.34 mg/dL (ref 0.50–1.35)
Calcium: 9 mg/dL (ref 8.4–10.5)
Chloride: 105 mmol/L (ref 96–112)
GFR calc Af Amer: 54 mL/min — ABNORMAL LOW (ref >90)
GFR calc non Af Amer: 46 mL/min — ABNORMAL LOW (ref >90)
GLUCOSE: 113 mg/dL — AB (ref 70–99)
Potassium: 4.3 mmol/L (ref 3.5–5.1)
SODIUM: 137 mmol/L (ref 135–145)

## 2014-05-26 LAB — CBC
HCT: 39.9 % (ref 39.0–52.0)
Hemoglobin: 13.1 g/dL (ref 13.0–17.0)
MCH: 29.2 pg (ref 26.0–34.0)
MCHC: 32.8 g/dL (ref 30.0–36.0)
MCV: 88.9 fL (ref 78.0–100.0)
PLATELETS: 159 10*3/uL (ref 150–400)
RBC: 4.49 MIL/uL (ref 4.22–5.81)
RDW: 13.9 % (ref 11.5–15.5)
WBC: 5.7 10*3/uL (ref 4.0–10.5)

## 2014-05-26 LAB — URINE MICROSCOPIC-ADD ON

## 2014-05-26 LAB — URINALYSIS, ROUTINE W REFLEX MICROSCOPIC
BILIRUBIN URINE: NEGATIVE
GLUCOSE, UA: NEGATIVE mg/dL
Ketones, ur: NEGATIVE mg/dL
LEUKOCYTES UA: NEGATIVE
NITRITE: NEGATIVE
PH: 5 (ref 5.0–8.0)
PROTEIN: NEGATIVE mg/dL
SPECIFIC GRAVITY, URINE: 1.011 (ref 1.005–1.030)
Urobilinogen, UA: 1 mg/dL (ref 0.0–1.0)

## 2014-05-26 LAB — BRAIN NATRIURETIC PEPTIDE: B NATRIURETIC PEPTIDE 5: 51.8 pg/mL (ref 0.0–100.0)

## 2014-05-26 LAB — TSH: TSH: 3.59 u[IU]/mL (ref 0.350–4.500)

## 2014-05-26 LAB — I-STAT TROPONIN, ED: TROPONIN I, POC: 0 ng/mL (ref 0.00–0.08)

## 2014-05-26 MED ORDER — DOCUSATE SODIUM 100 MG PO CAPS
100.0000 mg | ORAL_CAPSULE | Freq: Two times a day (BID) | ORAL | Status: DC
  Administered 2014-05-26 – 2014-05-28 (×4): 100 mg via ORAL
  Filled 2014-05-26 (×6): qty 1

## 2014-05-26 MED ORDER — SODIUM CHLORIDE 0.9 % IV BOLUS (SEPSIS)
500.0000 mL | Freq: Once | INTRAVENOUS | Status: AC
  Administered 2014-05-26: 500 mL via INTRAVENOUS

## 2014-05-26 MED ORDER — SODIUM CHLORIDE 0.9 % IV SOLN
INTRAVENOUS | Status: DC
  Administered 2014-05-26: 19:00:00 via INTRAVENOUS

## 2014-05-26 MED ORDER — BISACODYL 5 MG PO TBEC: 5.0000 mg | DELAYED_RELEASE_TABLET | Freq: Every day | ORAL | Status: DC | PRN

## 2014-05-26 MED ORDER — POLYETHYLENE GLYCOL 3350 17 G PO PACK
17.0000 g | PACK | Freq: Every day | ORAL | Status: DC
  Administered 2014-05-27: 17 g via ORAL
  Filled 2014-05-26 (×2): qty 1

## 2014-05-26 MED ORDER — MAGNESIUM HYDROXIDE 400 MG/5ML PO SUSP: 30.0000 mL | Freq: Every day | ORAL | Status: DC | PRN

## 2014-05-26 MED ORDER — CARVEDILOL 3.125 MG PO TABS
3.1250 mg | ORAL_TABLET | Freq: Every day | ORAL | Status: DC
  Administered 2014-05-27 – 2014-05-28 (×2): 3.125 mg via ORAL
  Filled 2014-05-26 (×3): qty 1

## 2014-05-26 MED ORDER — FINASTERIDE 5 MG PO TABS
50.0000 mg | ORAL_TABLET | Freq: Every day | ORAL | Status: DC
  Filled 2014-05-26: qty 10

## 2014-05-26 MED ORDER — NITROGLYCERIN 0.4 MG SL SUBL: 0.4000 mg | SUBLINGUAL_TABLET | SUBLINGUAL | Status: DC | PRN

## 2014-05-26 MED ORDER — OXYCODONE-ACETAMINOPHEN 5-325 MG PO TABS
2.0000 | ORAL_TABLET | Freq: Once | ORAL | Status: AC
Start: 2014-05-26 — End: 2014-05-26
  Administered 2014-05-26: 2 via ORAL
  Filled 2014-05-26: qty 2

## 2014-05-26 MED ORDER — TAMSULOSIN HCL 0.4 MG PO CAPS
0.4000 mg | ORAL_CAPSULE | Freq: Every day | ORAL | Status: DC
  Administered 2014-05-27 – 2014-05-28 (×2): 0.4 mg via ORAL
  Filled 2014-05-26 (×2): qty 1

## 2014-05-26 MED ORDER — ONDANSETRON HCL 4 MG/2ML IJ SOLN
4.0000 mg | Freq: Four times a day (QID) | INTRAMUSCULAR | Status: DC | PRN
  Administered 2014-05-26 – 2014-05-27 (×2): 4 mg via INTRAVENOUS
  Filled 2014-05-26 (×2): qty 2

## 2014-05-26 MED ORDER — MORPHINE SULFATE 4 MG/ML IJ SOLN
4.0000 mg | Freq: Once | INTRAMUSCULAR | Status: AC
  Administered 2014-05-26: 4 mg via INTRAVENOUS
  Filled 2014-05-26: qty 1

## 2014-05-26 MED ORDER — ENOXAPARIN SODIUM 40 MG/0.4ML ~~LOC~~ SOLN
40.0000 mg | SUBCUTANEOUS | Status: DC
  Administered 2014-05-26 – 2014-05-27 (×2): 40 mg via SUBCUTANEOUS
  Filled 2014-05-26 (×3): qty 0.4

## 2014-05-26 MED ORDER — ALUM & MAG HYDROXIDE-SIMETH 200-200-20 MG/5ML PO SUSP: 30.0000 mL | Freq: Four times a day (QID) | ORAL | Status: DC | PRN

## 2014-05-26 MED ORDER — HYDROCODONE-ACETAMINOPHEN 5-325 MG PO TABS
1.0000 | ORAL_TABLET | ORAL | Status: DC | PRN
  Administered 2014-05-26 – 2014-05-27 (×3): 1 via ORAL
  Filled 2014-05-26 (×3): qty 1

## 2014-05-26 MED ORDER — ACETAMINOPHEN 325 MG PO TABS: 650.0000 mg | ORAL_TABLET | Freq: Four times a day (QID) | ORAL | Status: DC | PRN

## 2014-05-26 MED ORDER — SPIRONOLACTONE 25 MG PO TABS
25.0000 mg | ORAL_TABLET | Freq: Every day | ORAL | Status: DC
  Administered 2014-05-26 – 2014-05-28 (×3): 25 mg via ORAL
  Filled 2014-05-26 (×3): qty 1

## 2014-05-26 MED ORDER — ACETAMINOPHEN 650 MG RE SUPP
650.0000 mg | Freq: Four times a day (QID) | RECTAL | Status: DC | PRN
Start: 2014-05-26 — End: 2014-05-28

## 2014-05-26 MED ORDER — GUAIFENESIN 100 MG/5ML PO SOLN
200.0000 mg | Freq: Three times a day (TID) | ORAL | Status: DC | PRN
  Administered 2014-05-27 – 2014-05-28 (×3): 200 mg via ORAL
  Filled 2014-05-26 (×3): qty 10

## 2014-05-26 MED ORDER — PANTOPRAZOLE SODIUM 40 MG PO TBEC
40.0000 mg | DELAYED_RELEASE_TABLET | Freq: Every day | ORAL | Status: DC
  Administered 2014-05-26 – 2014-05-28 (×3): 40 mg via ORAL
  Filled 2014-05-26 (×3): qty 1

## 2014-05-26 MED ORDER — MORPHINE SULFATE 2 MG/ML IJ SOLN
1.0000 mg | INTRAMUSCULAR | Status: DC | PRN
  Administered 2014-05-27: 1 mg via INTRAVENOUS
  Filled 2014-05-26: qty 1

## 2014-05-26 NOTE — H&P (Signed)
[facility] History and Physical  PARAG DORTON XKG:[id number] DOB: 12/17/1925 DOA: 05/26/2014  Referring physician:  B. Mingo Amber PCP:  [name], MD   Chief Complaint:  Fall  HPI:  The patient is a 81 y.o. year-old male with history of CAD s/p mild demand-ischemia NSTEMI 7026, chronic diastolic heart failure with EF 50-55% and grade 1 DD 05/2012, HTN, BPH, CKD stage III, newly diagnosed peripheral neuropathy who presents with fall.  The patient currently lives in [facility] assisted living and ambulates with a cane. He was recently seen by his primary care doctor for peripheral neuropathy, numbness of his feet with some tingling, and started on gabapentin about 2 days ago. He states he otherwise has been feeling fine, no fevers, chills, cough, shortness of breath, sinus congestion or runny nose, dysuria, nausea, vomiting, or diarrhea. Yesterday, he had a slight feeling of dizziness, but no new focal weakness, numbness, confusion, or facial droop.  This morning, he felt well when he woke. He walked to the bathroom and was getting ready to brush his teeth when his legs suddenly became weak beneath him. He crumpled to the floor onto his butt and hit his head gently on the wall. He denies loss of consciousness, faint feeling, focal numbness, tingling, weakness, chest pain, shortness of breath. He had severe pain, 10 out of 10 in his mid back radiating down bilateral legs to his feet. He was unable to get up with assistance from his wife and was transported to the emergency department for evaluation.  In the emergency department, his labs were at baseline, vital signs were stable. X-rays demonstrated a 40% compression fracture of the T12 vertebral body that appears to be new since a CT scan in 2014. Chest x-ray demonstrated no acute changes and urinalysis was negative. Due to pain he was unable to ambulate. He is being admitted for observation for pain control.  Review of Systems:  General:   Denies fevers, chills, weight loss or gain HEENT:  Denies changes to hearing and vision, rhinorrhea, sinus congestion, mild sore throat since coming to hospital CV:  Denies chest pain and palpitations,  mild intermittentlower extremity edema, but none recently .  PULM:  Denies SOB, wheezing, cough.   GI:  Denies nausea, vomiting, constipation, diarrhea.   GU:  Denies dysuria, frequency, urgency ENDO:  Denies polyuria, polydipsia.   HEME:  Denies hematemesis, blood in stools, melena, abnormal bruising or bleeding.  LYMPH:  Denies lymphadenopathy.   MSK:  Chronic arthralgias, thigh myalgias since trying to crawl across the floor DERM:  Denies skin rash or ulcer.   NEURO:  Per history of present illness PSYCH:  Denies anxiety and depression.    Past Medical History  Diagnosis Date  . Arthritis   . Hypertension   . Myocardial infarction 2014    mild"no symptoms" was told 2 months ago  . Pigmented skin lesions 07-27-12    generalized, never any skin cancer dx.  . Edema of extremities     lower extremities,feet; compression socks worned  . Kidney stones 07-27-12    hx. kidney stones, at present left ureteral stone  . Gastric ulcer 09/2012  . Skin cancer 10/27/12    poorly diff squamous cell carcinoma- left forehead  . CHF (congestive heart failure)   . Chronic kidney disease (CKD), stage III (moderate)   . 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 Hx of radiation therapy 01/15/13- 02/13/13    left temple 5000 cGy  20 sessions  . BPH (benign prostatic hyperplasia)   . Neuropathy    Past Surgical History  Procedure Laterality Date  . Cataract extraction, bilateral    . Wrist surgery      "nerve repair"  . Cystoscopy with retrograde pyelogram, ureteroscopy and stent placement Left 07/31/2012    Procedure: CYSTOSCOPY WITH LEFT RETROGRADE PYELOGRAM, LEFT URETEROSCOPY;  Surgeon: Claybon Jabs, MD;  Location: [facility];  Service: Urology;  Laterality: Left;  . Cystoscopy w/ ureteral  stent placement  02/2002    on left for ureteral stone  . Esophagogastroduodenoscopy N/A 09/14/2012    Procedure: ESOPHAGOGASTRODUODENOSCOPY (EGD);  Surgeon: Jerene Bears, MD;  Location: [facility];  Service: Gastroenterology;  Laterality: N/A;  . Appendectomy    . Esophagogastroduodenoscopy N/A 11/14/2012    Procedure: ESOPHAGOGASTRODUODENOSCOPY (EGD);  Surgeon: Jerene Bears, MD;  Location: [facility];  Service: Gastroenterology;  Laterality: N/A;  . Mohs surgery Left 11/28/12    forehead  . Skin biopsy Left 10/27/12    left forehead near frontal scalp  . Vasectomy     Social History:  reports that he quit smoking about 40 years ago. His smoking use included Cigarettes. He has a 11 pack-year smoking history. He has never used smokeless tobacco. He reports that he does not drink alcohol or use illicit drugs. Assisted living.  [facility], walks with a cane.   No Known Allergies  Family History  Problem Relation Age of Onset  . Colon cancer Neg Hx   . Heart attack Father   . Arthritis Mother      Prior to Admission medications   Medication Sig Start Date End Date Taking? Authorizing Provider  acetaminophen (TYLENOL) 500 MG tablet Take 500 mg by mouth every 6 (six) hours as needed for mild pain or fever.   Yes Historical Provider, MD  alum & mag hydroxide-simeth (MAALOX/MYLANTA) 200-200-20 MG/5ML suspension Take 30 mLs by mouth every 6 (six) hours as needed for indigestion or heartburn.   Yes Historical Provider, MD  carvedilol (COREG) 3.125 MG tablet Take 3.125 mg by mouth every morning.   Yes Historical Provider, MD  finasteride (PROSCAR) 5 MG tablet Take 50 mg by mouth every morning.  11/02/12  Yes Historical Provider, MD  furosemide (LASIX) 20 MG tablet Take 40 mg by mouth daily as needed for fluid or edema. Per patient   Yes Historical Provider, MD  gabapentin (NEURONTIN) 300 MG capsule Take 300 mg by mouth at bedtime.   Yes Historical Provider, MD  guaifenesin (ROBITUSSIN) 100  MG/5ML syrup Take 200 mg by mouth 3 (three) times daily as needed for cough.   Yes Historical Provider, MD  L-Methylfolate-B6-B12 (FOLTANX PO) Take 1 capsule by mouth 2 (two) times daily.   Yes Historical Provider, MD  loperamide (IMODIUM) 2 MG capsule Take 2 mg by mouth as needed for diarrhea or loose stools.   Yes Historical Provider, MD  magnesium hydroxide (MILK OF MAGNESIA) 400 MG/5ML suspension Take 30 mLs by mouth daily as needed for mild constipation.   Yes Historical Provider, MD  neomycin-bacitracin-polymyxin (NEOSPORIN) [phone] ointment Apply 1 application topically as needed (for minor skin tears or abrasions).    Yes Historical Provider, MD  nitroGLYCERIN (NITROSTAT) 0.4 MG SL tablet Place 0.4 mg under the tongue every 5 (five) minutes as needed for chest pain.   Yes Historical Provider, MD  pantoprazole (PROTONIX) 40 MG tablet Take 1 tablet (40 mg total) by mouth daily. Patient taking differently: Take 40 mg by  mouth every morning.  11/14/12  Yes Jerene Bears, MD  spironolactone (ALDACTONE) 25 MG tablet Take 25 mg by mouth every morning.  04/15/14  Yes Historical Provider, MD  tamsulosin (FLOMAX) 0.4 MG CAPS Take 0.4 mg by mouth every morning.    Yes Historical Provider, MD   Physical Exam: Filed Vitals:   05/26/14 0838 05/26/14 1201  BP: 111/56 105/55  Pulse: 66 66  Temp: 97.5 F (36.4 C) 98.1 F (36.7 C)  TempSrc: Oral Oral  Resp: 20 17  SpO2: 94% 100%     General:  Adult male, cachectic appearing around temples  Eyes:  Pinpoint pupils, equal round and reactive, anicteric, non-injected.  ENT:  Nares clear.  OP clear, non-erythematous without plaques or exudates.  MMM.  Neck:  Supple without TM or JVD.    Lymph:  No cervical, supraclavicular, or submandibular LAD.  Cardiovascular:  RRR, normal S1, S2, without m/r/g.  2+ pulses, warm extremities  Respiratory:  CTA bilaterally without increased WOB.  Abdomen:  NABS.  Soft,  moderately distended, somewhat firm to  palpation, difficult to discern edge of liver    Skin:  No rashes or focal lesions.  Musculoskeletal:  Normal bulk and tone. 1+ bilateral pitting LE edema.  Psychiatric:  A & O x 4.  Appropriate affect.  Neurologic:  CN 3-12 intact.  5/5 strength bilateral upper extremities.  strength examination in lower extremities limited by pain. Does have some ability to move against gravity of hips, knees. Ankles are 5 out of 5 bilateral plantar and dorsiflexion  Sensation diminished to light touch from the left knee down to the left foot.    Labs on Admission:  Basic Metabolic Panel:  Recent Labs Lab 05/26/14 0932  NA 137  K 4.3  CL 105  CO2 24  GLUCOSE 113*  BUN 19  CREATININE 1.34  CALCIUM 9.0   Liver Function Tests: No results for input(s): AST, ALT, ALKPHOS, BILITOT, PROT, ALBUMIN in the last 168 hours. No results for input(s): LIPASE, AMYLASE in the last 168 hours. No results for input(s): AMMONIA in the last 168 hours. CBC:  Recent Labs Lab 05/26/14 0932  WBC 5.7  HGB 13.1  HCT 39.9  MCV 88.9  PLT 159   Cardiac Enzymes: No results for input(s): CKTOTAL, CKMB, CKMBINDEX, TROPONINI in the last 168 hours.  BNP (last 3 results)  Recent Labs  05/26/14 0933  BNP 51.8    ProBNP (last 3 results) No results for input(s): PROBNP in the last 8760 hours.  CBG: No results for input(s): GLUCAP in the last 168 hours.  Radiological Exams on Admission: Dg Chest 2 View  05/26/2014   CLINICAL DATA:  [address] today at nursing home, knees gave out while he was standing in bathroom, fell onto lower back, history smoking  EXAM: CHEST  2 VIEW  COMPARISON:  09/12/2012  FINDINGS: Normal heart size, mediastinal contours and pulmonary vascularity.  Surgical clips at the paraspinal regions bilaterally in the upper to mid chest.  Emphysematous changes without infiltrate, pleural effusion or pneumothorax.  Bones appear demineralized.  IMPRESSION: COPD changes.  No acute abnormalities.    Electronically Signed   By: Lavonia Dana M.D.   On: 05/26/2014 10:16   Dg Lumbar Spine Complete  05/26/2014   CLINICAL DATA:  81 year old who fell in the bathroom at the nursing home onto his low back. Low back pain. Initial encounter.  EXAM: LUMBAR SPINE - COMPLETE 4+ VIEW  COMPARISON:  Bone window images from  CT angio chest abdomen and pelvis 09/12/2012 and bone window images from CT abdomen and pelvis 07/21/2012.  FINDINGS: Five non rib-bearing lumbar vertebrae with anatomic alignment. Compression fracture involving the T12 vertebral body with approximate 40% loss of height anteriorly, progressive since the prior CTs. No other fractures. Lumbar disc spaces well preserved. DISH with bridging anterior osteophyte/ossification in anterior longitudinal ligament as noted previously. No pars defects. Diffuse facet degenerative changes. Sacroiliac joints intact.  Aortoiliac atherosclerosis without aneurysm. Gaseous distention of several loops of jejunum in the left upper quadrant.  IMPRESSION: 1. Approximate 40% compression fracture of the T12 vertebral body, progressive since prior CT examinations in 2014. Please correlate with point tenderness as there may be an acute component. 2. Otherwise, no significant interval change in the appearance of the lumbar spine.   Electronically Signed   By: Evangeline Dakin M.D.   On: 05/26/2014 11:53   Dg Pelvis 1-2 Views  05/26/2014   CLINICAL DATA:  81 year old who fell in the bathroom at the nursing home earlier today. Patient fell onto the low back and pelvis. Initial encounter.  EXAM: PELVIS - 1-2 VIEW  COMPARISON:  MRI pelvis/left hip 01/16/2012. Bone window images from CT abdomen pelvis 07/21/2012.  FINDINGS: No acute fractures involving the pelvis or proximal femora. Symmetric moderate axial joint space narrowing in both hips with hypertrophic spurring involving the roof the acetabulum, unchanged. Sacroiliac joints and symphysis pubis intact with mild degenerative  changes. Mild osseous demineralization. Degenerative changes involving the visualized lower lumbar spine.  IMPRESSION: 1. No acute osseous abnormality. 2. Symmetric moderate osteoarthritis involving both hips. 3. Degenerative changes involving the visualized lower lumbar spine. Please see the report of the concurrent lumbar spine imaging for details.   Electronically Signed   By: Evangeline Dakin M.D.   On: 05/26/2014 10:15    EKG: Independently reviewed. NSR, flattened t-waves in inferolateral leads (previously deep T-wave inversions during hospitalization for anemia and hypotension 2014)  Assessment/Plan Active Problems:   T12 compression fracture   BPH (benign prostatic hyperplasia)  ---  Fall, sounds to be negative mechanical since the patient had no presyncopal like symptoms. I suspect that this was secondary to the recent initiation of gabapentin since he also felt a little dizzy yesterday.  Low suspicion for stroke or TIA since weakness was bilateral.   -  Orthostatics after the patient has received his back brace and his pain is a little better controlled -  Discontinue gabapentin -  EKG is stable and troponin is negative -  No signs of infection on chest x-ray or urinalysis  T12 compression fracture -  Back brace being placed by occupational therapist -  Start Percocet 1 tab every 4 hours when necessary pain -  No NSAIDS due to hx of bleeding ulcer -  miralax and colace and bisacodyl  Peripheral neuropathy,  I wonder if he has some spinal stenosis as he ambulates leaning forward at the waist, has pain when he stands upright, and is developing some bilateral feet numbness. His daughter has spinal stenosis. -  HIV, RPR, vitamin B12, TSH -  Consider outpatient MRI -  Discontinue gabapentin as above -  Consider Cymbalta or some alternative medication, but will defer to primary care doctor  CAD/hypertension, blood pressure low normal and chest pain-free -  Patient is not on aspirin  due to history of bleeding ulcer -  Would not recommend starting aspirin given high falls risk at this time -  Continue beta blocker -  Patient  currently not on statin, unclear why  Chronic diastolic heart failure, trace bilateral pitting edema -  Continue spironolactone for now -  Hold lasix  BPH, with difficulty urinating -  Continue flomax and finasteride  Hx of bleeding ulcer -  Denies rectal bleeding -  Continue PPI  Abdominal distension seems out of proportion to patient's temporal wasting, feels firm like adipose, not like ascites.  Patient denies constipation or heavy steroid use. -  No further testing at this time -  Reexamine in AM or post-BM  Diet:  regular Access:  PIV IVF:  off Proph:  lovenox  Code Status: FULL Family Communication: patient and his daughter Disposition Plan: Admit to med-surg  Time spent: 60 min [name] [facility] Pager [phone]  If 7PM-7AM, please contact night-coverage [email or web address] Password [facility] 05/26/2014, 2:58 PM

## 2014-05-26 NOTE — ED Notes (Addendum)
[facility] contacted regarding tlso brace , to be worn when oob or ambulatory

## 2014-05-26 NOTE — ED Notes (Signed)
Bed: WA02 Expected date: 05/26/14 Expected time: 8:25 AM Means of arrival: Ambulance Comments: Fall

## 2014-05-26 NOTE — ED Notes (Signed)
Per EMS they were contacted by [facility] d/t patient fall.  Facility reports pt standing in the bathroom when his legs became weak, he landed on his back side. Pt able to slide into another room. Pt has c/o back pain x 2 days. Pain in back started prior to fall.  In route pt c/o pain in throat that radiated to chest  rated as severe, it has now now resolved 106/58, 70, 20 cbg 119

## 2014-05-26 NOTE — ED Provider Notes (Signed)
CSN: [id number]     Arrival date & time 05/26/14  9735 History   First MD Initiated Contact with Patient 05/26/14 [phone]     Chief Complaint  Patient presents with  . Fall     (Consider location/radiation/quality/duration/timing/severity/associated sxs/prior Treatment) HPI Comments: Was standing up while shaving and had weakness in his legs. Patient fell onto the floor, landing on his buttocks. No head injury or loss of consciousness. Patient had some dizziness intermittently yesterday while watching a basketball game. Denies CP, SOB, vomiting. Had a mild sore throat that he noticed en route to the ED.  Patient is a 81 y.o. male presenting with fall. The history is provided by the patient.  Fall This is a new problem. The current episode started 1 to 2 hours ago. Episode frequency: once. The problem has not changed since onset.Pertinent negatives include no abdominal pain and no shortness of breath. Nothing aggravates the symptoms. Nothing relieves the symptoms. He has tried nothing for the symptoms. The treatment provided no relief.    Past Medical History  Diagnosis Date  . Arthritis   . Hypertension   . Myocardial infarction     mild"no symptoms" was told 2 months ago  . Pigmented skin lesions 07-27-12    generalized, never any skin cancer dx.  . Edema of extremities     lower extremities,feet; compression socks worned  . Kidney stones 07-27-12    hx. kidney stones, at present left ureteral stone  . Gastric ulcer 09/2012  . Skin cancer 10/27/12    poorly diff squamous cell carcinoma- left forehead  . CHF (congestive heart failure)   . Renal insufficiency     chronic kidney disease  . 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 Hx of radiation therapy 01/15/13- 02/13/13    left temple 5000 cGy 20 sessions   Past Surgical History  Procedure Laterality Date  . Cataract extraction, bilateral    . Wrist surgery      "nerve repair"  . Cystoscopy with retrograde  pyelogram, ureteroscopy and stent placement Left 07/31/2012    Procedure: CYSTOSCOPY WITH LEFT RETROGRADE PYELOGRAM, LEFT URETEROSCOPY;  Surgeon: Claybon Jabs, MD;  Location: [facility];  Service: Urology;  Laterality: Left;  . Cystoscopy w/ ureteral stent placement  02/2002    on left for ureteral stone  . Esophagogastroduodenoscopy N/A 09/14/2012    Procedure: ESOPHAGOGASTRODUODENOSCOPY (EGD);  Surgeon: Jerene Bears, MD;  Location: [place];  Service: Gastroenterology;  Laterality: N/A;  . Appendectomy    . Eye surgery      bilateral catracts  . Esophagogastroduodenoscopy N/A 11/14/2012    Procedure: ESOPHAGOGASTRODUODENOSCOPY (EGD);  Surgeon: Jerene Bears, MD;  Location: [facility];  Service: Gastroenterology;  Laterality: N/A;  . Mohs surgery Left 11/28/12    forehead  . Skin biopsy Left 10/27/12    left forehead near frontal scalp  . Vasectomy     Family History  Problem Relation Age of Onset  . Colon cancer Neg Hx   . Heart attack Father   . Arthritis Mother    History  Substance Use Topics  . Smoking status: Former Smoker -- 0.50 packs/day for 22 years    Types: Cigarettes    Quit date: 07/27/1973  . Smokeless tobacco: Never Used  . Alcohol Use: No    Review of Systems  Constitutional: Negative for fever.  Respiratory: Negative for cough and shortness of breath.   Gastrointestinal: Negative for vomiting and abdominal pain.  Neurological: Positive for dizziness (yesterday) and weakness (in his legs).  All other systems reviewed and are negative.     Allergies  Review of patient's allergies indicates no known allergies.  Home Medications   Prior to Admission medications   Medication Sig Start Date End Date Taking? Authorizing Provider  acetaminophen (TYLENOL) 500 MG tablet Take 500 mg by mouth every 6 (six) hours as needed for mild pain or fever.   Yes Historical Provider, MD  alum & mag hydroxide-simeth (MAALOX/MYLANTA) 200-200-20 MG/5ML suspension Take 30 mLs by  mouth every 6 (six) hours as needed for indigestion or heartburn.   Yes Historical Provider, MD  carvedilol (COREG) 3.125 MG tablet Take 3.125 mg by mouth every morning.   Yes Historical Provider, MD  finasteride (PROSCAR) 5 MG tablet Take 50 mg by mouth every morning.  11/02/12  Yes Historical Provider, MD  furosemide (LASIX) 20 MG tablet Take 40 mg by mouth daily as needed for fluid or edema. Per patient   Yes Historical Provider, MD  gabapentin (NEURONTIN) 300 MG capsule Take 300 mg by mouth at bedtime.   Yes Historical Provider, MD  guaifenesin (ROBITUSSIN) 100 MG/5ML syrup Take 200 mg by mouth 3 (three) times daily as needed for cough.   Yes Historical Provider, MD  L-Methylfolate-B6-B12 (FOLTANX PO) Take 1 capsule by mouth 2 (two) times daily.   Yes Historical Provider, MD  loperamide (IMODIUM) 2 MG capsule Take 2 mg by mouth as needed for diarrhea or loose stools.   Yes Historical Provider, MD  magnesium hydroxide (MILK OF MAGNESIA) 400 MG/5ML suspension Take 30 mLs by mouth daily as needed for mild constipation.   Yes Historical Provider, MD  neomycin-bacitracin-polymyxin (NEOSPORIN) [phone] ointment Apply 1 application topically as needed (for minor skin tears or abrasions).    Yes Historical Provider, MD  nitroGLYCERIN (NITROSTAT) 0.4 MG SL tablet Place 0.4 mg under the tongue every 5 (five) minutes as needed for chest pain.   Yes Historical Provider, MD  pantoprazole (PROTONIX) 40 MG tablet Take 1 tablet (40 mg total) by mouth daily. Patient taking differently: Take 40 mg by mouth every morning.  11/14/12  Yes Jerene Bears, MD  spironolactone (ALDACTONE) 25 MG tablet Take 25 mg by mouth every morning.  04/15/14  Yes Historical Provider, MD  tamsulosin (FLOMAX) 0.4 MG CAPS Take 0.4 mg by mouth every morning.    Yes Historical Provider, MD   BP 111/56 mmHg  Pulse 66  Temp(Src) 97.5 F (36.4 C) (Oral)  Resp 20  SpO2 94% Physical Exam  Constitutional: He is oriented to person, place, and  time. He appears well-developed and well-nourished. No distress.  HENT:  Head: Normocephalic and atraumatic.  Mouth/Throat: No oropharyngeal exudate.  Eyes: EOM are normal. Pupils are equal, round, and reactive to light.  Neck: Normal range of motion. Neck supple.  Cardiovascular: Normal rate and regular rhythm.  Exam reveals no friction rub.   No murmur heard. Pulmonary/Chest: Effort normal and breath sounds normal. No respiratory distress. He has no wheezes. He has no rales.  Abdominal: Soft. He exhibits no distension. There is no tenderness. There is no rebound.  Musculoskeletal: Normal range of motion. He exhibits no edema.  Neurological: He is alert and oriented to person, place, and time. He exhibits normal muscle tone.  Sensation intact in all extremities. Lower extremities weak bilaterally.  Skin: No rash noted. He is not diaphoretic.  Nursing note and vitals reviewed.   ED Course  Procedures (including critical care  time) Labs Review Labs Reviewed  CBC  BASIC METABOLIC PANEL  BRAIN NATRIURETIC PEPTIDE  URINALYSIS, ROUTINE W REFLEX MICROSCOPIC  I-STAT TROPOININ, ED    Imaging Review Dg Chest 2 View  05/26/2014   CLINICAL DATA:  [place] [address] today at nursing home, knees gave out while he was standing in bathroom, fell onto lower back, history smoking  EXAM: CHEST  2 VIEW  COMPARISON:  09/12/2012  FINDINGS: Normal heart size, mediastinal contours and pulmonary vascularity.  Surgical clips at the paraspinal regions bilaterally in the upper to mid chest.  Emphysematous changes without infiltrate, pleural effusion or pneumothorax.  Bones appear demineralized.  IMPRESSION: COPD changes.  No acute abnormalities.   Electronically Signed   By: Lavonia Dana M.D.   On: 05/26/2014 10:16   Dg Lumbar Spine Complete  05/26/2014   CLINICAL DATA:  81 year old who fell in the bathroom at the nursing home onto his low back. Low back pain. Initial encounter.  EXAM: LUMBAR SPINE - COMPLETE 4+ VIEW   COMPARISON:  Bone window images from CT angio chest abdomen and pelvis 09/12/2012 and bone window images from CT abdomen and pelvis 07/21/2012.  FINDINGS: Five non rib-bearing lumbar vertebrae with anatomic alignment. Compression fracture involving the T12 vertebral body with approximate 40% loss of height anteriorly, progressive since the prior CTs. No other fractures. Lumbar disc spaces well preserved. DISH with bridging anterior osteophyte/ossification in anterior longitudinal ligament as noted previously. No pars defects. Diffuse facet degenerative changes. Sacroiliac joints intact.  Aortoiliac atherosclerosis without aneurysm. Gaseous distention of several loops of jejunum in the left upper quadrant.  IMPRESSION: 1. Approximate 40% compression fracture of the T12 vertebral body, progressive since prior CT examinations in 2014. Please correlate with point tenderness as there may be an acute component. 2. Otherwise, no significant interval change in the appearance of the lumbar spine.   Electronically Signed   By: Evangeline Dakin M.D.   On: 05/26/2014 11:53   Dg Pelvis 1-2 Views  05/26/2014   CLINICAL DATA:  81 year old who fell in the bathroom at the nursing home earlier today. Patient fell onto the low back and pelvis. Initial encounter.  EXAM: PELVIS - 1-2 VIEW  COMPARISON:  MRI pelvis/left hip 01/16/2012. Bone window images from CT abdomen pelvis 07/21/2012.  FINDINGS: No acute fractures involving the pelvis or proximal femora. Symmetric moderate axial joint space narrowing in both hips with hypertrophic spurring involving the roof the acetabulum, unchanged. Sacroiliac joints and symphysis pubis intact with mild degenerative changes. Mild osseous demineralization. Degenerative changes involving the visualized lower lumbar spine.  IMPRESSION: 1. No acute osseous abnormality. 2. Symmetric moderate osteoarthritis involving both hips. 3. Degenerative changes involving the visualized lower lumbar spine. Please  see the report of the concurrent lumbar spine imaging for details.   Electronically Signed   By: Evangeline Dakin M.D.   On: 05/26/2014 10:15     EKG Interpretation   Date/Time:  Sunday May 26 2014 08:38:29 EDT Ventricular Rate:  66 PR Interval:  173 QRS Duration: 90 QT Interval:  438 QTC Calculation: 459 R Axis:   13 Text Interpretation:  Sinus rhythm Borderline low voltage, extremity leads  Abnormal R-wave progression, early transition Abnormal inferior Q waves No  significant change since last tracing Confirmed by Mingo Amber  MD, [place]  ([id number]) on 05/26/2014 8:42:02 AM      MDM   Final diagnoses:  Weakness  Fall  T12 compression fracture, initial encounter    63M presents with fall. Became weak while shaving  today. No CP, SOB, N/V/D. Patient reported some dizziness intermittently yesterday. Patient here states he feels well, but is having some sore throat and leg weakness.  On exam, patient here with some lower extremity weakness. Able to pick up his legs from the bed but tires quickly. Lungs clear, no peripheral edema. Belly benign.  EKG ok. Will plan on labs, xrays. Labs ok. Xrays show T12 compression fracture. Admitted for pain control.   Evelina Bucy, MD 05/26/14 [phone]

## 2014-05-27 ENCOUNTER — Observation Stay (HOSPITAL_COMMUNITY): Payer: Medicare Other

## 2014-05-27 DIAGNOSIS — M4854XD Collapsed vertebra, not elsewhere classified, thoracic region, subsequent encounter for fracture with routine healing: Secondary | ICD-10-CM | POA: Diagnosis not present

## 2014-05-27 DIAGNOSIS — S22089A Unspecified fracture of T11-T12 vertebra, initial encounter for closed fracture: Secondary | ICD-10-CM | POA: Diagnosis not present

## 2014-05-27 DIAGNOSIS — N400 Enlarged prostate without lower urinary tract symptoms: Secondary | ICD-10-CM | POA: Diagnosis not present

## 2014-05-27 LAB — COMPREHENSIVE METABOLIC PANEL
ALT: 16 U/L (ref 0–53)
ANION GAP: 12 (ref 5–15)
AST: 27 U/L (ref 0–37)
Albumin: 3.3 g/dL — ABNORMAL LOW (ref 3.5–5.2)
Alkaline Phosphatase: 95 U/L (ref 39–117)
BUN: 22 mg/dL (ref 6–23)
CHLORIDE: 105 mmol/L (ref 96–112)
CO2: 20 mmol/L (ref 19–32)
Calcium: 8.3 mg/dL — ABNORMAL LOW (ref 8.4–10.5)
Creatinine, Ser: 1.34 mg/dL (ref 0.50–1.35)
GFR calc non Af Amer: 46 mL/min — ABNORMAL LOW (ref >90)
GFR, EST AFRICAN AMERICAN: 54 mL/min — AB (ref >90)
GLUCOSE: 113 mg/dL — AB (ref 70–99)
Potassium: 4.2 mmol/L (ref 3.5–5.1)
Sodium: 137 mmol/L (ref 135–145)
Total Bilirubin: 0.6 mg/dL (ref 0.3–1.2)
Total Protein: 6.4 g/dL (ref 6.0–8.3)

## 2014-05-27 LAB — CBC WITH DIFFERENTIAL/PLATELET
BASOS PCT: 1 % (ref 0–1)
Basophils Absolute: 0.1 10*3/uL (ref 0.0–0.1)
EOS PCT: 1 % (ref 0–5)
Eosinophils Absolute: 0.1 10*3/uL (ref 0.0–0.7)
HEMATOCRIT: 37.4 % — AB (ref 39.0–52.0)
Hemoglobin: 12.7 g/dL — ABNORMAL LOW (ref 13.0–17.0)
LYMPHS PCT: 12 % (ref 12–46)
Lymphs Abs: 1 10*3/uL (ref 0.7–4.0)
MCH: 30.1 pg (ref 26.0–34.0)
MCHC: 34 g/dL (ref 30.0–36.0)
MCV: 88.6 fL (ref 78.0–100.0)
MONO ABS: 0.7 10*3/uL (ref 0.1–1.0)
Monocytes Relative: 8 % (ref 3–12)
NEUTROS ABS: 6.9 10*3/uL (ref 1.7–7.7)
Neutrophils Relative %: 78 % — ABNORMAL HIGH (ref 43–77)
Platelets: 143 10*3/uL — ABNORMAL LOW (ref 150–400)
RBC: 4.22 MIL/uL (ref 4.22–5.81)
RDW: 14 % (ref 11.5–15.5)
WBC: 8.8 10*3/uL (ref 4.0–10.5)

## 2014-05-27 LAB — VITAMIN B12: Vitamin B-12: 427 pg/mL (ref 211–911)

## 2014-05-27 LAB — LIPASE, BLOOD: Lipase: 17 U/L (ref 11–59)

## 2014-05-27 LAB — HIV ANTIBODY (ROUTINE TESTING W REFLEX): HIV SCREEN 4TH GENERATION: NONREACTIVE

## 2014-05-27 LAB — RPR: RPR: NONREACTIVE

## 2014-05-27 LAB — LACTIC ACID, PLASMA: Lactic Acid, Venous: 1.1 mmol/L (ref 0.5–2.0)

## 2014-05-27 MED ORDER — FUROSEMIDE 10 MG/ML IJ SOLN
40.0000 mg | Freq: Once | INTRAMUSCULAR | Status: AC
  Administered 2014-05-27: 40 mg via INTRAVENOUS
  Filled 2014-05-27: qty 4

## 2014-05-27 MED ORDER — IOHEXOL 300 MG/ML  SOLN
25.0000 mL | INTRAMUSCULAR | Status: AC
  Administered 2014-05-27 (×2): 25 mL via ORAL

## 2014-05-27 MED ORDER — MORPHINE SULFATE 15 MG PO TABS: 15.0000 mg | ORAL_TABLET | ORAL | Status: DC | PRN

## 2014-05-27 MED ORDER — FINASTERIDE 5 MG PO TABS
5.0000 mg | ORAL_TABLET | Freq: Every day | ORAL | Status: DC
  Administered 2014-05-27 – 2014-05-28 (×2): 5 mg via ORAL
  Filled 2014-05-27 (×2): qty 1

## 2014-05-27 MED ORDER — IOHEXOL 300 MG/ML  SOLN
100.0000 mL | Freq: Once | INTRAMUSCULAR | Status: AC | PRN
  Administered 2014-05-27: 100 mL via INTRAVENOUS

## 2014-05-27 NOTE — Evaluation (Addendum)
Physical Therapy Evaluation Patient Details Name: Eduardo Salazar MRN: [id number] DOB: 25-Dec-1925 Today's Date: 05/27/2014   History of Present Illness  The patient is a 81 y.o adm 05/26/14 after fall at ALF resulting in T12 compression fx; PMHx: CAD,NSTEMI chronic diastolic heart failure with EF 50-55% and grade 1 DD 05/2012, HTN, BPH, CKD stage III, peripheral neuropathy  Clinical Impression  Pt admitted with above diagnosis. Recommend SNF placement post acute.   Pt currently with functional limitations due to the deficits listed below (see PT Problem List).  Pt will benefit from skilled PT to increase their independence and safety with mobility to allow discharge to the venue listed below.  Pt  Currently needing min to mod assist for sit to stand transfers and bed mobility, he would need assist for all mobility at his current status to prevent falls; pt was limited by pain, fatigue and urinary incontinence at time of PT eval;  He appears to have decreased insight into his deficits at his time and may not be agreeable to SNF even if it  Were possible from an insurance standpoint;      Follow Up Recommendations SNF (vs HHPT depending on progress; pt would need assist /supervision for all mobility at this time )    Equipment Recommendations  None recommended by PT    Recommendations for Other Services       Precautions / Restrictions Precautions Precautions: Back Required Braces or Orthoses: Spinal Brace Spinal Brace: Thoracolumbosacral orthotic Restrictions Weight Bearing Restrictions: No      Mobility  Bed Mobility Overal bed mobility: Needs Assistance Bed Mobility: Sit to Sidelying Rolling: Mod assist Sidelying to sit: Mod assist     Sit to sidelying: Mod assist General bed mobility comments: assist with LEs, cues for positioning and technique, poor control of trunk for sit>s/l  Transfers Overall transfer level: Needs assistance Equipment used: Rolling walker (2  wheeled) Transfers: Sit to/from Stand Sit to Stand: Min assist         General transfer comment: cues for safety and correct hand placement using RW, requires increased time  Ambulation/Gait             General Gait Details: unable to amb d/t pain, fatigue and urinary incontinence  Stairs            Wheelchair Mobility    Modified Rankin (Stroke Patients Only)       Balance Overall balance assessment: Needs assistance Sitting-balance support: Feet supported Sitting balance-Leahy Scale: Fair Sitting balance - Comments: pt can't wt shift to don R sock   Standing balance support: During functional activity;Single extremity supported;Bilateral upper extremity supported Standing balance-Leahy Scale: Poor Standing balance comment: pt requires min/guard to min assist to maintain balance with unilateral UE support while trying to use urinal                             Pertinent Vitals/Pain Pain Assessment: Faces Pain Score: 5  Pain Location: L side Pain Descriptors / Indicators: Contraction;Moaning;Jabbing Pain Intervention(s): RN gave pain meds during session;Repositioned    Home Living Family/patient expects to be discharged to:: Assisted living               Home Equipment: Walker - 2 wheels;Cane - quad;Cane - single point Additional Comments: lives with wife in ALF, she can't assist pt     Prior Function Level of Independence: Independent with assistive device(s)  Comments: used cane     Hand Dominance   Dominant Hand: Right    Extremity/Trunk Assessment   Upper Extremity Assessment: Defer to OT evaluation           Lower Extremity Assessment: Generalized weakness         Communication   Communication: No difficulties  Cognition Arousal/Alertness: Awake/alert Behavior During Therapy: WFL for tasks assessed/performed Overall Cognitive Status: Within Functional Limits for tasks assessed Area of Impairment:  Problem solving;Safety/judgement         Safety/Judgement: Decreased awareness of safety;Decreased awareness of deficits   Problem Solving: Difficulty sequencing;Requires verbal cues;Requires tactile cues      General Comments      Exercises        Assessment/Plan    PT Assessment Patient needs continued PT services  PT Diagnosis Difficulty walking;Generalized weakness   PT Problem List Decreased strength;Decreased activity tolerance;Decreased balance;Decreased mobility;Decreased knowledge of use of DME;Decreased knowledge of precautions;Decreased safety awareness  PT Treatment Interventions DME instruction;Gait training;Functional mobility training;Therapeutic activities;Patient/family education;Therapeutic exercise   PT Goals (Current goals can be found in the Care Plan section) Acute Rehab PT Goals Patient Stated Goal: go back to ALF with his wife PT Goal Formulation: With patient Time For Goal Achievement: 06/03/14 Potential to Achieve Goals: Good    Frequency Min 3X/week   Barriers to discharge Decreased caregiver support      Co-evaluation               End of Session Equipment Utilized During Treatment: Gait belt Activity Tolerance: Patient limited by fatigue;Patient limited by pain Patient left: in bed;with call bell/phone within reach Nurse Communication: Mobility status    Functional Assessment Tool Used: clinical judgement Functional Limitation: Mobility: Walking and moving around Mobility: Walking and Moving Around Current Status (Q0347): At least 20 percent but less than 40 percent impaired, limited or restricted Mobility: Walking and Moving Around Goal Status [phone]): At least 1 percent but less than 20 percent impaired, limited or restricted    Time: 1140-1215 PT Time Calculation (min) (ACUTE ONLY): 35 min   Charges:   PT Evaluation $Initial PT Evaluation Tier I: 1 Procedure PT Treatments Therapeutic activity   PT G Codes:   PT G-Codes  **NOT FOR INPATIENT CLASS** Functional Assessment Tool Used: clinical judgement Functional Limitation: Mobility: Walking and moving around Mobility: Walking and Moving Around Current Status (G3875): At least 20 percent but less than 40 percent impaired, limited or restricted Mobility: Walking and Moving Around Goal Status [phone]): At least 1 percent but less than 20 percent impaired, limited or restricted    [facility] 05/27/2014, 12:51 PM

## 2014-05-27 NOTE — Progress Notes (Signed)
Pt presents with cough. Robutussin given. No relief noted. Frothy sputum. Both lower lungs diminished. Chest expiratory wheezing. MD notified.

## 2014-05-27 NOTE — Progress Notes (Signed)
[facility] PROGRESS NOTE  EKANSH Salazar LFY:[id number] DOB: 1925/03/31 DOA: 05/26/2014 PCP: [name], MD  Brief Summary  The patient is a 81 y.o. year-old male with history of CAD s/p mild demand-ischemia NSTEMI 8527, chronic diastolic heart failure with EF 50-55% and grade 1 DD 05/2012, HTN, BPH, CKD stage III, newly diagnosed peripheral neuropathy who presents with fall. The patient currently lives in [facility] assisted living and ambulates with a cane. He was recently seen by his primary care doctor for peripheral neuropathy, numbness of his feet with some tingling, and started on gabapentin about 2 days prior to admission. He had otherwise been feeling fine. The day prior to presentation, he had a slight feeling of dizziness, but no new focal weakness, numbness, confusion, or facial droop.The morning of admission, he walked to the bathroom and was getting ready to brush his teeth when his legs suddenly gave out beneath him.  He denied LOC, faint feeling, focal numbness, tingling, weakness, chest pain, shortness of breath. He had severe pain, 10 out of 10 in his mid back radiating down bilateral legs to his feet.  He was transported to the emergency department for evaluation.  In the emergency department, his labs were at baseline, vital signs were stable. X-rays demonstrated a 40% compression fracture of the T12 vertebral body that appears to be new since a CT scan in 2014. Chest x-ray demonstrated no acute changes and urinalysis was negative. Due to pain he was unable to ambulate. He was admitted for pain control.  Course complicated by abdominal distension and pain.    Assessment/Plan  Fall, sounds to be negative mechanical since the patient had no presyncopal like symptoms. I suspect that this was secondary to the recent initiation of gabapentin since he also felt a little dizzy the day prior. Low suspicion for stroke or TIA since weakness was bilateral.  - Orthostatics not  completed due to pain, patient hydrated slightly - Discontinued gabapentin - EKG was stable and troponin was negative - No signs of infection on chest x-ray or urinalysis -  PT recommending SNF, OT recommended HH OT depending on progression  T12 compression fracture - Back brace placed to be used prn - Percocet and hydrocodone making him feel sick to his stomach -  Trial of morphine IR - No NSAIDS due to hx of bleeding ulcer - miralax and colace and bisacodyl  Abdominal pain and distension with nausea and vomiting. Abdominal distension has been worsening for a while.  Question if he has some abdominal malignancy.   -  Check LFTs, lipase -  CT abd/pelvis  -  UA was negative yesterday  Peripheral neuropathy, I wonder if he has some spinal stenosis as he ambulates leaning forward at the waist, has pain when he stands upright, and is developing some bilateral feet numbness. His daughter has spinal stenosis. - HIV, RPR pending, vitamin B12 427, TSH 3.59 - Consider outpatient MRI - Discontinue gabapentin as above - Consider Cymbalta or some alternative medication, but will defer to primary care doctor  CAD/hypertension, blood pressure low normal and chest pain-free - Patient is not on aspirin due to history of bleeding ulcer - Would not recommend starting aspirin given high falls risk at this time - Continue beta blocker - Patient currently not on statin, unclear why  Chronic diastolic heart failure, 1+ bilateral pitting edema, increased cough - Continue spironolactone for now - Hold lasix -  D/c IVF -  Will review cuts from CT ab/p for  evidence of effusion or pneumonia  BPH, with difficulty urinating - Continue flomax and finasteride  Hx of bleeding ulcer - Denies rectal bleeding - Continue PPI  Diet: CLD for now Access: PIV IVF: off Proph: lovenox  Code Status: FULL Family Communication: patient and his daughter Disposition Plan: Admit to  [facility]  Consultants:  None  Procedures:  CXR, lumbar and pelvis XR  CT abd/pelvis  Antibiotics:  none   HPI/Subjective:  Did not sleep well overnight. States the pain medication does not help much and it makes him feel sick to his stomach. He has been coughing and it is difficult for him to get his secretions up because of abdominal pain. He now has diffuse abdominal pain without clear focus.  He cannot remember when he had his last bowel movement. He is have progressive abdominal distention for the last year.  Objective: Filed Vitals:   05/26/14 1500 05/26/14 1546 05/26/14 2125 05/27/14 0605  BP: 118/67 106/57 119/61 122/72  Pulse: 65 66 68 70  Temp:  98.3 F (36.8 C) 98.5 F (36.9 C) 98.8 F (37.1 C)  TempSrc:  Oral Oral Oral  Resp: 16 20 18 18   Height:  5\' 7"  (1.702 m)    Weight:  74.7 kg (164 lb 10.9 oz)    SpO2: 80% 98% 95% 96%    Intake/Output Summary (Last 24 hours) at 05/27/14 1256 Last data filed at 05/27/14 1000  Gross per 24 hour  Intake 1455.83 ml  Output    177 ml  Net 1278.83 ml   Filed Weights   05/26/14 1546  Weight: 74.7 kg (164 lb 10.9 oz)    Exam:   General:  Adult male, ill-appearing, No acute distress  HEENT:  NCAT, MMM  Cardiovascular:  RRR, nl S1, S2 no mrg, 2+ pulses, warm extremities  Respiratory:  Rhonchorous breath sounds bilaterally, no wheezes, no positive faint rales at the bases, no increased WOB  Abdomen:   NABS, soft, moderately distended, tender to palpation diffusely with some mild guarding, no rebound  MSK:   Normal tone and bulk, 1+ bilateral pitting LEE  Neuro:  4/5 strength bilateral hips, knees, 5 out of 5 ankle dorsi and plantar flexion  Data Reviewed: Basic Metabolic Panel:  Recent Labs Lab 05/26/14 0932  NA 137  K 4.3  CL 105  CO2 24  GLUCOSE 113*  BUN 19  CREATININE 1.34  CALCIUM 9.0   Liver Function Tests: No results for input(s): AST, ALT, ALKPHOS, BILITOT, PROT, ALBUMIN in the last 168  hours. No results for input(s): LIPASE, AMYLASE in the last 168 hours. No results for input(s): AMMONIA in the last 168 hours. CBC:  Recent Labs Lab 05/26/14 0932  WBC 5.7  HGB 13.1  HCT 39.9  MCV 88.9  PLT 159   Cardiac Enzymes: No results for input(s): CKTOTAL, CKMB, CKMBINDEX, TROPONINI in the last 168 hours. BNP (last 3 results)  Recent Labs  05/26/14 0933  BNP 51.8    ProBNP (last 3 results) No results for input(s): PROBNP in the last 8760 hours.  CBG: No results for input(s): GLUCAP in the last 168 hours.  No results found for this or any previous visit (from the past 240 hour(s)).   Studies: Dg Chest 2 View  05/26/2014   CLINICAL DATA:  [address] today at nursing home, knees gave out while he was standing in bathroom, fell onto lower back, history smoking  EXAM: CHEST  2 VIEW  COMPARISON:  09/12/2012  FINDINGS: Normal heart  size, mediastinal contours and pulmonary vascularity.  Surgical clips at the paraspinal regions bilaterally in the upper to mid chest.  Emphysematous changes without infiltrate, pleural effusion or pneumothorax.  Bones appear demineralized.  IMPRESSION: COPD changes.  No acute abnormalities.   Electronically Signed   By: Lavonia Dana M.D.   On: 05/26/2014 10:16   Dg Lumbar Spine Complete  05/26/2014   CLINICAL DATA:  81 year old who fell in the bathroom at the nursing home onto his low back. Low back pain. Initial encounter.  EXAM: LUMBAR SPINE - COMPLETE 4+ VIEW  COMPARISON:  Bone window images from CT angio chest abdomen and pelvis 09/12/2012 and bone window images from CT abdomen and pelvis 07/21/2012.  FINDINGS: Five non rib-bearing lumbar vertebrae with anatomic alignment. Compression fracture involving the T12 vertebral body with approximate 40% loss of height anteriorly, progressive since the prior CTs. No other fractures. Lumbar disc spaces well preserved. DISH with bridging anterior osteophyte/ossification in anterior longitudinal ligament as noted  previously. No pars defects. Diffuse facet degenerative changes. Sacroiliac joints intact.  Aortoiliac atherosclerosis without aneurysm. Gaseous distention of several loops of jejunum in the left upper quadrant.  IMPRESSION: 1. Approximate 40% compression fracture of the T12 vertebral body, progressive since prior CT examinations in 2014. Please correlate with point tenderness as there may be an acute component. 2. Otherwise, no significant interval change in the appearance of the lumbar spine.   Electronically Signed   By: Evangeline Dakin M.D.   On: 05/26/2014 11:53   Dg Pelvis 1-2 Views  05/26/2014   CLINICAL DATA:  81 year old who fell in the bathroom at the nursing home earlier today. Patient fell onto the low back and pelvis. Initial encounter.  EXAM: PELVIS - 1-2 VIEW  COMPARISON:  MRI pelvis/left hip 01/16/2012. Bone window images from CT abdomen pelvis 07/21/2012.  FINDINGS: No acute fractures involving the pelvis or proximal femora. Symmetric moderate axial joint space narrowing in both hips with hypertrophic spurring involving the roof the acetabulum, unchanged. Sacroiliac joints and symphysis pubis intact with mild degenerative changes. Mild osseous demineralization. Degenerative changes involving the visualized lower lumbar spine.  IMPRESSION: 1. No acute osseous abnormality. 2. Symmetric moderate osteoarthritis involving both hips. 3. Degenerative changes involving the visualized lower lumbar spine. Please see the report of the concurrent lumbar spine imaging for details.   Electronically Signed   By: Evangeline Dakin M.D.   On: 05/26/2014 10:15    Scheduled Meds: . carvedilol  3.125 mg Oral Q breakfast  . docusate sodium  100 mg Oral BID  . enoxaparin (LOVENOX) injection  40 mg Subcutaneous Q24H  . finasteride  5 mg Oral Daily  . pantoprazole  40 mg Oral Daily  . polyethylene glycol  17 g Oral Daily  . spironolactone  25 mg Oral Daily  . tamsulosin  0.4 mg Oral Daily   Continuous  Infusions:   Active Problems:   T12 compression fracture   BPH (benign prostatic hyperplasia)    Time spent: 30 min    Lashawne Dura, [facility] Pager [phone]. If 7PM-7AM, please contact night-coverage at [email or web address], password [facility] 05/27/2014, 12:56 PM

## 2014-05-27 NOTE — Progress Notes (Signed)
Pt presents with somewhat distended abdomen. Bladder scanned. 238cc urine scanned. I&O cath not performed. Order reads to I&O for urine scanned over 300cc. Bowel sounds present and pt passing good amounts of gas. No BM since 3/11 other than smearing his brief. Daughter wishes for pt to wear his Depends from home. Will pass along to oncoming shift.

## 2014-05-27 NOTE — Progress Notes (Signed)
Clinical Social Work Department BRIEF PSYCHOSOCIAL ASSESSMENT 05/27/2014  Patient:  Eduardo Salazar, Eduardo Salazar     Account Number:  [id number]     Admit date:  05/26/2014  Clinical Social Worker:  Lacie Scotts  Date/Time:  05/27/2014 03:00 PM  Referred by:  CSW  Date Referred:  05/27/2014 Referred for  ALF Placement   Other Referral:   Interview type:  Family Other interview type:    PSYCHOSOCIAL DATA Living Status:  FACILITY Admitted from facility:  Other Level of care:  Assisted Living Primary support name:  Eduardo Salazar Primary support relationship to patient:  CHILD, ADULT Degree of support available:   supportive    CURRENT CONCERNS Current Concerns  Post-Acute Placement   Other Concerns:    SOCIAL WORK ASSESSMENT / PLAN Pt is an 81 yr old gentleman admitted from [place]. CSW met with pt's daughter ( pt receiving care ) to assist with d/c planning. PN reviewed. PT is recommending SNF depending on pt's progress. Pt's medicare will not cover SNF placement at this time due to Observation status. Clinicals sent to [facility] for review. CSW has explained situation and ALF will consider readmitting pt with HH PT / OT. MD contacted to review needs. PT / OT will be ordered at d/c. Pt is not stable for d/c today. CSW will continue to follow to assist with d/c planning.   Assessment/plan status:  Psychosocial Support/Ongoing Assessment of Needs Other assessment/ plan:   Information/referral to community resources:   SNF vs ALF reviewed. Observation status vs in patient as it pertains to medicare coverage for SNF placement.    PATIENT'S/FAMILY'S RESPONSE TO PLAN OF CARE: Pt would like to return to ALF. Pt's spouse also resides at [facility] ). " My father can't afford to pay out of pocket for rehab. I'd like him to return to ALF if they can manage his care. " Daughter has been satisfied with the care parents have received at ALF. Daughter is  concerned about her father's health. Support provided.

## 2014-05-27 NOTE — Evaluation (Signed)
Occupational Therapy Evaluation Patient Details Name: Eduardo Salazar MRN: [id number] DOB: [birth year] Today's Date: 05/27/2014    History of Present Illness The patient is a 81 y.o adm 05/26/14 after fall at ALF resulting in T12 compression fx; PMHx: CAD,NSTEMI chronic diastolic heart failure with EF 50-55% and grade 1 DD 05/2012, HTN, BPH, CKD stage III, peripheral neuropathy   Clinical Impression   Pt demonstrates decline in function and safety with ADLs and ADL mobility with decreased strength, balance and endurance. Pt would benefit from acute OT services to address impairments to increase level of function and safety    Follow Up Recommendations  Home health OT (depending on acute care progress, may need higher level of care)    Equipment Recommendations  None recommended by OT;Other (comment) (TBD)    Recommendations for Other Services       Precautions / Restrictions Precautions Precautions: Back Required Braces or Orthoses: Spinal Brace Spinal Brace: Thoracolumbosacral orthotic Restrictions Weight Bearing Restrictions: No      Mobility Bed Mobility Overal bed mobility: Needs Assistance Bed Mobility: Sit to Sidelying Rolling: Mod assist Sidelying to sit: Mod assist     Sit to sidelying: Mod assist General bed mobility comments: assist with LEs, cues for positioning and technique, poor control of trunk for sit>s/l  Transfers Overall transfer level: Needs assistance Equipment used: Rolling walker (2 wheeled) Transfers: Sit to/from Stand Sit to Stand: Min assist         General transfer comment: cues for safety and correct hand placement using RW, requires increased time    Balance Overall balance assessment: Needs assistance Sitting-balance support: Feet supported Sitting balance-Leahy Scale: Fair Sitting balance - Comments: pt can't wt shift to don R sock   Standing balance support: During functional activity;Single extremity supported;Bilateral upper  extremity supported Standing balance-Leahy Scale: Poor Standing balance comment: pt requires min/guard to min assist to maintain balance with unilateral UE support while trying to use urinal                            ADL Overall ADL's : Needs assistance/impaired     Grooming: Wash/dry hands;Wash/dry face;Standing;Minimal assistance   Upper Body Bathing: Supervision/ safety;Set up;Sitting   Lower Body Bathing: Moderate assistance   Upper Body Dressing : Supervision/safety;Set up;Sitting   Lower Body Dressing: Maximal assistance   Toilet Transfer: Minimal assistance;Comfort height toilet;Grab bars;RW Toilet Transfer Details (indicate cue type and reason): pt began to stand from toilet without OT assist, increased time required in bathroom Toileting- Clothing Manipulation and Hygiene: Minimal assistance;Sit to/from stand       Functional mobility during ADLs: Minimal assistance;Rolling walker General ADL Comments: cues for safety and correct hand placement using RW. requires increased time     Vision  reading glasses   Perception Perception Perception Tested?: No   Praxis Praxis Praxis tested?: Not tested    Pertinent Vitals/Pain Pain Assessment: Faces Pain Score: 5  Pain Location: L side Pain Descriptors / Indicators: Contraction;Moaning;Jabbing Pain Intervention(s): RN gave pain meds during session;Repositioned     Hand Dominance Right   Extremity/Trunk Assessment Upper Extremity Assessment Upper Extremity Assessment: Defer to OT evaluation   Lower Extremity Assessment Lower Extremity Assessment: Generalized weakness       Communication Communication Communication: No difficulties   Cognition Arousal/Alertness: Awake/alert Behavior During Therapy: WFL for tasks assessed/performed Overall Cognitive Status: Within Functional Limits for tasks assessed Area of Impairment: Problem solving;Safety/judgement  Safety/Judgement: Decreased  awareness of safety;Decreased awareness of deficits   Problem Solving: Difficulty sequencing;Requires verbal cues;Requires tactile cues     General Comments   pt pleasant and cooperative, daughter supportive                 Home Living Family/patient expects to be discharged to:: Assisted living                             Home Equipment: Gilford Rile - 2 wheels;Cane - quad;Cane - single point   Additional Comments: lives with wife in ALF, she can't assist pt       Prior Functioning/Environment Level of Independence: Independent with assistive device(s)        Comments: used cane    OT Diagnosis: Generalized weakness   OT Problem List: Decreased strength;Decreased knowledge of use of DME or AE;Decreased activity tolerance;Impaired balance (sitting and/or standing);Decreased safety awareness;Pain   OT Treatment/Interventions: Self-care/ADL training;Therapeutic exercise;Patient/family education;Therapeutic activities;DME and/or AE instruction    OT Goals(Current goals can be found in the care plan section) Acute Rehab OT Goals Patient Stated Goal: go back to ALF OT Goal Formulation: With patient/family Time For Goal Achievement: 06/03/14 Potential to Achieve Goals: Good ADL Goals Pt Will Perform Grooming: with set-up;with supervision;standing Pt Will Perform Lower Body Bathing: with min assist;sit to/from stand Pt Will Perform Lower Body Dressing: with mod assist;sitting/lateral leans;sit to/from stand Pt Will Transfer to Toilet: with min guard assist;ambulating;grab bars (3 in 1 over toilet) Pt Will Perform Toileting - Clothing Manipulation and hygiene: with min guard assist;sit to/from stand Pt Will Perform Tub/Shower Transfer: with min assist;with min guard assist;shower seat;3 in 1  OT Frequency: Min 2X/week   Barriers to D/C:    uncertain if ALF can provide assist for ADLs                     End of Session Equipment Utilized During Treatment:  Gait belt;Rolling walker Nurse Communication: Mobility status  Activity Tolerance: Patient tolerated treatment well Patient left: in chair;with call bell/phone within reach;with family/visitor present   Time: 1833-5825 OT Time Calculation (min): 34 min Charges:    G-Codes: OT G-codes **NOT FOR INPATIENT CLASS** Functional Limitation: Self care Self Care Current Status (P8984): At least 20 percent but less than 40 percent impaired, limited or restricted Self Care Goal Status (K1031): At least 1 percent but less than 20 percent impaired, limited or restricted  Britt Bottom 05/27/2014, 12:42 PM

## 2014-05-27 NOTE — Progress Notes (Signed)
UR completed 

## 2014-05-28 DIAGNOSIS — M4854XD Collapsed vertebra, not elsewhere classified, thoracic region, subsequent encounter for fracture with routine healing: Secondary | ICD-10-CM | POA: Diagnosis not present

## 2014-05-28 DIAGNOSIS — R05 Cough: Secondary | ICD-10-CM | POA: Diagnosis not present

## 2014-05-28 DIAGNOSIS — R1084 Generalized abdominal pain: Secondary | ICD-10-CM

## 2014-05-28 DIAGNOSIS — S22089A Unspecified fracture of T11-T12 vertebra, initial encounter for closed fracture: Secondary | ICD-10-CM | POA: Diagnosis not present

## 2014-05-28 DIAGNOSIS — N183 Chronic kidney disease, stage 3 (moderate): Secondary | ICD-10-CM

## 2014-05-28 MED ORDER — LEVOFLOXACIN IN D5W 750 MG/150ML IV SOLN
750.0000 mg | Freq: Once | INTRAVENOUS | Status: DC
  Filled 2014-05-28 (×2): qty 150

## 2014-05-28 MED ORDER — ACETAMINOPHEN 500 MG PO TABS: 1000.0000 mg | ORAL_TABLET | Freq: Three times a day (TID) | ORAL | Status: DC | PRN

## 2014-05-28 MED ORDER — LEVOFLOXACIN 750 MG PO TABS: 750.0000 mg | ORAL_TABLET | ORAL | Status: DC

## 2014-05-28 MED ORDER — SORBITOL 70 % SOLN
960.0000 mL | TOPICAL_OIL | Freq: Once | ORAL | Status: AC
  Administered 2014-05-28: 960 mL via RECTAL
  Filled 2014-05-28: qty 240

## 2014-05-28 MED ORDER — TRAMADOL HCL 50 MG PO TABS: 50.0000 mg | ORAL_TABLET | Freq: Four times a day (QID) | ORAL | Status: DC | PRN

## 2014-05-28 MED ORDER — PANTOPRAZOLE SODIUM 40 MG PO TBEC: 40.0000 mg | DELAYED_RELEASE_TABLET | Freq: Every day | ORAL | Status: DC

## 2014-05-28 MED ORDER — POLYETHYLENE GLYCOL 3350 17 G PO PACK
34.0000 g | PACK | Freq: Once | ORAL | Status: AC
  Administered 2014-05-28: 34 g via ORAL

## 2014-05-28 MED ORDER — LEVOFLOXACIN 750 MG PO TABS
750.0000 mg | ORAL_TABLET | ORAL | Status: DC
  Administered 2014-05-28: 750 mg via ORAL
  Filled 2014-05-28: qty 1

## 2014-05-28 MED ORDER — DOCUSATE SODIUM 100 MG PO CAPS: 100.0000 mg | ORAL_CAPSULE | Freq: Two times a day (BID) | ORAL | Status: DC

## 2014-05-28 MED ORDER — POLYETHYLENE GLYCOL 3350 17 G PO PACK: 17.0000 g | PACK | Freq: Every day | ORAL | Status: DC

## 2014-05-28 NOTE — Discharge Summary (Addendum)
Physician Discharge Summary  CODI KERTZ KGM:[id number] DOB: 1925-07-26 DOA: 05/26/2014  PCP: [name], MD  Admit date: 05/26/2014 Discharge date: 05/28/2014  Recommendations for Outpatient Follow-up:  1. Transfer back to ALF for ongoing PT/OT.  Patient and family decline SNF at this time 2. PCP in about 1 week.  Please repeat CBC and BMP 3. Levofloxacin taken q48h, next dose on 3/17 x 3 more doses 4. Started on levofloxacin for pneumonia 5. Consider cymbalta and possible MRI lumbar spine when feeling better to eval for spinal stenosis  Discharge Diagnoses:  Active Problems:   Abdominal pain   HTN (hypertension)   CKD (chronic kidney disease) stage 3, GFR 30-59 ml/min   T12 compression fracture   BPH (benign prostatic hyperplasia)   Cough   CAP (community acquired pneumonia)   Constipation   Discharge Condition: stable, improved  Diet recommendation: low sodium  Wt Readings from Last 3 Encounters:  05/26/14 74.7 kg (164 lb 10.9 oz)  11/14/12 62.596 kg (138 lb)  10/17/12 63.957 kg (141 lb)    History of present illness/brief summary:  The patient is a 81 y.o. year-old male with history of CAD s/p mild demand-ischemia NSTEMI 6440, chronic diastolic heart failure with EF 50-55% and grade 1 DD 05/2012, HTN, BPH, CKD stage III, newly diagnosed peripheral neuropathy who presents with fall. The patient currently lives in [facility] assisted living and ambulates with a cane. He was recently seen by his primary care doctor for peripheral neuropathy, numbness of his feet with some tingling, and started on gabapentin about 2 days prior to admission. He had otherwise been feeling fine. The day prior to presentation, he had a slight feeling of dizziness, but no new focal weakness, numbness, confusion, or facial droop.The morning of admission, he walked to the bathroom and was getting ready to brush his teeth when his legs suddenly gave out beneath him. He denied LOC, faint feeling,  focal numbness, tingling, weakness, chest pain, shortness of breath. He had severe pain, 10 out of 10 in his mid back radiating down bilateral legs to his feet. He was transported to the emergency department for evaluation. In the emergency department, his labs were at baseline, vital signs were stable. X-rays demonstrated a 40% compression fracture of the T12 vertebral body that appears to be new since a CT scan in 2014. Chest x-ray demonstrated no acute changes and urinalysis was negative. Due to pain he was unable to ambulate. He was admitted for pain control. He was fitted for a brace.  Narcotic pain medication made his sick to his stomach so he declined further narcotics.  Course was complicated by abdominal distension and pain and SOB.  He was found to have a possible pneumonia on CT scan of the abdomen and pelvis and was started on levofloxacin.  He also had new moderate right hydroneprhosis which was likely not responsible for his pain.  He was clearly constipated and was given a SMOG enema and additional miralax.    Hospital Course:   Fall, sounded to be mechanical since the patient had no presyncopal like symptoms. I suspect that this was secondary to the recent initiation of gabapentin since he also felt a little dizzy the day prior which is when he started this medication. Low suspicion for stroke or TIA since weakness was bilateral.  - Orthostatics not completed due to pain - Discontinued gabapentin  - EKG was stable and troponin was negative  - No signs of infection on chest x-ray or  urinalysis initially, but developed some cough and congestion.  CT scan and exam were concerning for possible pneumonia so he was started on levofloxacin  - PT recommending SNF, OT recommended HH OT -  Patient to return to ALF with [facility] services  T12 compression fracture - Back brace placed to be used prn - Percocet and hydrocodone making him feel sick to his stomach - Trial of morphine IR also  caused nausea  -  Resume tramadol as needed - No NSAIDS due to hx of bleeding ulcer -  Tylenol 1gm TID prn pain  - miralax and colace and bisacodyl  CAP, not hypoxic or in respiratory distress, afebrile, but coughing  -  Started levofloxacin to complete a 7 day course  Constipation:  Developed abdominal pain and distension with nausea and vomiting. Abdominal distension has been worsening for a while and seemd out of proportion to temporal wasting. - Check LFTs, lipase:  wnl - CT abd/pelvis:  No acute abnormality.  Has some constipation and progression of right hydronephrosis for which he will need to follow up with urology - UA was negative -  SMOG enema and miralax -  Start daily miralax  Peripheral neuropathy, I wonder if he has some spinal stenosis as he ambulates leaning forward at the waist, has pain when he stands upright, and is developing some bilateral feet numbness. His daughter has spinal stenosis. - HIV NR, RPR NR, vitamin B12 427, TSH 3.59 - Consider outpatient MRI - Discontinue gabapentin as above - Consider Cymbalta or some alternative medication, but will defer to primary care doctor  CAD/hypertension, blood pressure low normal and chest pain-free - Patient is not on aspirin due to history of bleeding ulcer - Would not recommend starting aspirin given high falls risk at this time - Continue beta blocker - Patient currently not on statin, unclear why > defer to PCP as he may have had an intolerance that he does not remember  Chronic diastolic heart failure, 1+ bilateral pitting edema, increased cough - Continue spironolactone for now - Initially held lasix, but resumed   BPH, with difficulty urinating - Continue flomax and finasteride  Hx of bleeding ulcer - Denied rectal bleeding - Continued PPI  Consultants:  None  Procedures:  CXR, lumbar and pelvis XR  CT abd/pelvis  Antibiotics:  levofloxacin  Discharge Exam: Filed Vitals:    05/28/14 0834  BP: 110/60  Pulse:   Temp:   Resp:    Filed Vitals:   05/27/14 1747 05/27/14 2125 05/28/14 0553 05/28/14 0834  BP: 130/73 99/54 92/48  110/60  Pulse: 84 86 72   Temp:  97.9 F (36.6 C) 98.9 F (37.2 C)   TempSrc:  Oral Oral   Resp:  18 18   Height:      Weight:      SpO2:  92% 96%      General: Adult male, ill-appearing, No acute distress  HEENT: NCAT, MMM  Cardiovascular: RRR, nl S1, S2 no mrg, 2+ pulses, warm extremities  Respiratory: Rhonchorous breath sounds bilaterally with course rales at bases, no wheezes, no increased WOB  Abdomen: NABS, soft, moderately distended, tender to palpation diffusely with some mild guarding, no rebound  MSK: Normal tone and bulk, 1+ bilateral pitting LEE  Neuro: 4/5 strength bilateral hips, knees, 5 out of 5 ankle dorsi and plantar flexion  Discharge Instructions      Discharge Instructions    (HEART FAILURE PATIENTS) Call MD:  Anytime you have any of the following symptoms:  1) 3 pound weight gain in 24 hours or 5 pounds in 1 week 2) shortness of breath, with or without a dry hacking cough 3) swelling in the hands, feet or stomach 4) if you have to sleep on extra pillows at night in order to breathe.    Complete by:  As directed      Call MD for:  difficulty breathing, headache or visual disturbances    Complete by:  As directed      Call MD for:  extreme fatigue    Complete by:  As directed      Call MD for:  hives    Complete by:  As directed      Call MD for:  persistant dizziness or light-headedness    Complete by:  As directed      Call MD for:  persistant nausea and vomiting    Complete by:  As directed      Call MD for:  severe uncontrolled pain    Complete by:  As directed      Call MD for:  temperature >100.4    Complete by:  As directed      Diet - low sodium heart healthy    Complete by:  As directed      Increase activity slowly    Complete by:  As directed             Medication  List    STOP taking these medications        gabapentin 300 MG capsule  Commonly known as:  NEURONTIN      TAKE these medications        acetaminophen 500 MG tablet  Commonly known as:  TYLENOL  Take 2 tablets (1,000 mg total) by mouth every 8 (eight) hours as needed for mild pain, fever or headache.     alum & mag hydroxide-simeth 200-200-20 MG/5ML suspension  Commonly known as:  MAALOX/MYLANTA  Take 30 mLs by mouth every 6 (six) hours as needed for indigestion or heartburn.     carvedilol 3.125 MG tablet  Commonly known as:  COREG  Take 3.125 mg by mouth every morning.     docusate sodium 100 MG capsule  Commonly known as:  COLACE  Take 1 capsule (100 mg total) by mouth 2 (two) times daily.     finasteride 5 MG tablet  Commonly known as:  PROSCAR  Take 5 mg by mouth daily.     FOLTANX PO  Take 1 capsule by mouth 2 (two) times daily.     furosemide 20 MG tablet  Commonly known as:  LASIX  Take 40 mg by mouth daily as needed for fluid or edema. Per patient     guaifenesin 100 MG/5ML syrup  Commonly known as:  ROBITUSSIN  Take 200 mg by mouth 3 (three) times daily as needed for cough.     levofloxacin 750 MG tablet  Commonly known as:  LEVAQUIN  Take 1 tablet (750 mg total) by mouth every other day.  Start taking on:  05/30/2014     loperamide 2 MG capsule  Commonly known as:  IMODIUM  Take 2 mg by mouth as needed for diarrhea or loose stools.     magnesium hydroxide 400 MG/5ML suspension  Commonly known as:  MILK OF MAGNESIA  Take 30 mLs by mouth daily as needed for mild constipation.     neomycin-bacitracin-polymyxin [phone] ointment  Apply 1 application topically as needed (for minor skin tears  or abrasions).     nitroGLYCERIN 0.4 MG SL tablet  Commonly known as:  NITROSTAT  Place 0.4 mg under the tongue every 5 (five) minutes as needed for chest pain.     pantoprazole 40 MG tablet  Commonly known as:  PROTONIX  Take 1 tablet (40 mg total) by mouth  daily.     polyethylene glycol packet  Commonly known as:  MIRALAX / GLYCOLAX  Take 17 g by mouth daily.     spironolactone 25 MG tablet  Commonly known as:  ALDACTONE  Take 25 mg by mouth every morning.     tamsulosin 0.4 MG Caps capsule  Commonly known as:  FLOMAX  Take 0.4 mg by mouth every morning.     traMADol 50 MG tablet  Commonly known as:  ULTRAM  Take 1 tablet (50 mg total) by mouth every 6 (six) hours as needed for severe pain.       Follow-up Information    Follow up with [name], MD. Schedule an appointment as soon as possible for a visit in 1 week.   Specialty:  Internal Medicine   Contact information:   [address] [place] [address] [phone]        The results of significant diagnostics from this hospitalization (including imaging, microbiology, ancillary and laboratory) are listed below for reference.    Significant Diagnostic Studies: Dg Chest 2 View  05/26/2014   CLINICAL DATA:  [place] [address] today at nursing home, knees gave out while he was standing in bathroom, fell onto lower back, history smoking  EXAM: CHEST  2 VIEW  COMPARISON:  09/12/2012  FINDINGS: Normal heart size, mediastinal contours and pulmonary vascularity.  Surgical clips at the paraspinal regions bilaterally in the upper to mid chest.  Emphysematous changes without infiltrate, pleural effusion or pneumothorax.  Bones appear demineralized.  IMPRESSION: COPD changes.  No acute abnormalities.   Electronically Signed   By: Lavonia Dana M.D.   On: 05/26/2014 10:16   Dg Lumbar Spine Complete  05/26/2014   CLINICAL DATA:  81 year old who fell in the bathroom at the nursing home onto his low back. Low back pain. Initial encounter.  EXAM: LUMBAR SPINE - COMPLETE 4+ VIEW  COMPARISON:  Bone window images from CT angio chest abdomen and pelvis 09/12/2012 and bone window images from CT abdomen and pelvis 07/21/2012.  FINDINGS: Five non rib-bearing lumbar vertebrae with anatomic  alignment. Compression fracture involving the T12 vertebral body with approximate 40% loss of height anteriorly, progressive since the prior CTs. No other fractures. Lumbar disc spaces well preserved. DISH with bridging anterior osteophyte/ossification in anterior longitudinal ligament as noted previously. No pars defects. Diffuse facet degenerative changes. Sacroiliac joints intact.  Aortoiliac atherosclerosis without aneurysm. Gaseous distention of several loops of jejunum in the left upper quadrant.  IMPRESSION: 1. Approximate 40% compression fracture of the T12 vertebral body, progressive since prior CT examinations in 2014. Please correlate with point tenderness as there may be an acute component. 2. Otherwise, no significant interval change in the appearance of the lumbar spine.   Electronically Signed   By: Evangeline Dakin M.D.   On: 05/26/2014 11:53   Dg Pelvis 1-2 Views  05/26/2014   CLINICAL DATA:  81 year old who fell in the bathroom at the nursing home earlier today. Patient fell onto the low back and pelvis. Initial encounter.  EXAM: PELVIS - 1-2 VIEW  COMPARISON:  MRI pelvis/left hip 01/16/2012. Bone window images from CT abdomen pelvis 07/21/2012.  FINDINGS: No acute fractures involving the pelvis or proximal femora. Symmetric moderate axial joint space narrowing in both hips with hypertrophic spurring involving the roof the acetabulum, unchanged. Sacroiliac joints and symphysis pubis intact with mild degenerative changes. Mild osseous demineralization. Degenerative changes involving the visualized lower lumbar spine.  IMPRESSION: 1. No acute osseous abnormality. 2. Symmetric moderate osteoarthritis involving both hips. 3. Degenerative changes involving the visualized lower lumbar spine. Please see the report of the concurrent lumbar spine imaging for details.   Electronically Signed   By: Evangeline Dakin M.D.   On: 05/26/2014 10:15   Ct Abdomen Pelvis W Contrast  05/27/2014   CLINICAL DATA:   Acute onset of left flank and generalized abdominal pain. Initial encounter.  EXAM: CT ABDOMEN AND PELVIS WITH CONTRAST  TECHNIQUE: Multidetector CT imaging of the abdomen and pelvis was performed using the standard protocol following bolus administration of intravenous contrast.  CONTRAST:  139mL OMNIPAQUE IOHEXOL 300 MG/ML  SOLN  COMPARISON:  CTA of the chest, abdomen and pelvis performed 09/12/2012  FINDINGS: Mild left basilar opacity may reflect atelectasis or pneumonia, with mild underlying bronchiectasis.  The liver and spleen are unremarkable in appearance. Mildly high attenuation material seen dependently within the gallbladder may reflect echogenic sludge or possibly tiny stones. The gallbladder is otherwise unremarkable in appearance. The pancreas and adrenal glands are unremarkable.  There is mildly worsened left renal atrophy, with severe chronic left-sided hydronephrosis again noted. Diffuse dilatation of the left ureter is again seen. This may reflect chronic stricturing at the left vesicoureteral junction, as no obstructing stone is identified.  There is new mild to moderate right-sided hydronephrosis. A nonobstructing 8 mm stone is noted at the interpole region of the right kidney. The hydronephrosis may reflect some degree of bladder outlet obstruction, as no distal obstructing stone is identified. Nonspecific perinephric stranding is noted bilaterally.  No free fluid is identified. The small bowel is unremarkable in appearance. The stomach is within normal limits. No acute vascular abnormalities are seen. Relatively diffuse calcification is seen along the abdominal aorta and its branches, with mild associated mural thrombus.  The patient is status post appendectomy; an apparent appendiceal stump is grossly unremarkable in appearance. Minimal diverticulosis is noted along the ascending colon. Scattered diverticulosis is seen along the descending and sigmoid colon, without evidence of diverticulitis.   The bladder is moderately distended, with scattered small diverticula. The prostate is mildly enlarged, measuring 4.9 cm in transverse dimension, with scattered calcification. No inguinal lymphadenopathy is seen.  No acute osseous abnormalities are identified. There is mild chronic loss of height at vertebral body T12.  IMPRESSION: 1. No acute abnormality seen to explain the patient's symptoms. 2. Mild left basilar airspace opacity may reflect atelectasis or pneumonia, with mild underlying bronchiectasis. 3. New mild to moderate right-sided hydronephrosis, without evidence of distal obstructing stone. This may reflect some degree of bladder outlet obstruction. 4. Mildly worsened left renal atrophy, with severe chronic left-sided hydronephrosis again seen. Diffuse dilatation of the left ureter again noted. This may reflect underlying chronic stricturing at the left vesicoureteral junction, given the lack of an obstructing stone. 5. Mildly high attenuation material dependently within the gallbladder may reflect echogenic sludge or possibly tiny stones. 6. Relatively diffuse calcification along the abdominal aorta and its branches, with mild associated mural thrombus. 7. Nonobstructing 8 mm stone at the interpole region of the right kidney. 8. Scattered diverticulosis along the ascending, descending and sigmoid colon, without evidence of diverticulitis. 9. Mildly enlarged prostate.  10. Scattered small diverticula noted about the bladder.   Electronically Signed   By: Garald Balding M.D.   On: 05/27/2014 22:10   Dg Chest Port 1 View  05/27/2014   CLINICAL DATA:  Dyspnea.  Productive cough.  EXAM: PORTABLE CHEST - 1 VIEW  COMPARISON:  PA and lateral chest 05/26/2014 and 07/31/2012. CT chest 09/12/2012.  FINDINGS: There is mild cardiomegaly. Lung volumes are lower than on the comparison examinations with crowding of the bronchovascular structures. No consolidative process, pneumothorax or effusion is identified.   IMPRESSION: No acute disease.   Electronically Signed   By: Inge Rise M.D.   On: 05/27/2014 16:14    Microbiology: No results found for this or any previous visit (from the past 240 hour(s)).   Labs: Basic Metabolic Panel:  Recent Labs Lab 05/26/14 0932 05/27/14 1352  NA 137 137  K 4.3 4.2  CL 105 105  CO2 24 20  GLUCOSE 113* 113*  BUN 19 22  CREATININE 1.34 1.34  CALCIUM 9.0 8.3*   Liver Function Tests:  Recent Labs Lab 05/27/14 1352  AST 27  ALT 16  ALKPHOS 95  BILITOT 0.6  PROT 6.4  ALBUMIN 3.3*    Recent Labs Lab 05/27/14 1352  LIPASE 17   No results for input(s): AMMONIA in the last 168 hours. CBC:  Recent Labs Lab 05/26/14 0932 05/27/14 1352  WBC 5.7 8.8  NEUTROABS  --  6.9  HGB 13.1 12.7*  HCT 39.9 37.4*  MCV 88.9 88.6  PLT 159 143*   Cardiac Enzymes: No results for input(s): CKTOTAL, CKMB, CKMBINDEX, TROPONINI in the last 168 hours. BNP: BNP (last 3 results)  Recent Labs  05/26/14 0933  BNP 51.8    ProBNP (last 3 results) No results for input(s): PROBNP in the last 8760 hours.  CBG: No results for input(s): GLUCAP in the last 168 hours.  Time coordinating discharge: 35 minutes  Signed:  Heath Badon  [facility] 05/28/2014, 9:58 AM

## 2014-05-28 NOTE — Progress Notes (Signed)
Large BM of soft, formed stool noted following SMOG enema. Pt expressed relief of abd discomfort after stool.

## 2014-05-28 NOTE — Progress Notes (Addendum)
Report called to [facility] and given to [employer] Pt's dx and current status reported as well as recent vitals taken and medications given. Pt to be transported back to facility daughter in personal vehicle. Preparing for dc. No further stools noted since SMOG enema given.

## 2014-05-28 NOTE — Progress Notes (Signed)
Physical Therapy Treatment Patient Details Name: Eduardo Salazar MRN: [id number] DOB: 10/13/1925 Today's Date: 05/28/2014    History of Present Illness The patient is a 81 y.o adm 05/26/14 after fall at ALF resulting in T12 compression fx; PMHx: CAD,NSTEMI chronic diastolic heart failure with EF 50-55% and grade 1 DD 05/2012, HTN, BPH, CKD stage III, peripheral neuropathy    PT Comments    Pt was seen for assessing his gait and new back brace.  His fit of brace was an issue, as the sternal pad had an edge and was irritating his throat.  Daughter has a card for the orthotist and PT will address with nursing to see what his options are for brace to be adjusted here before leaving for ALF.   Follow Up Recommendations  SNF;Supervision/Assistance - 24 hour;Other (comment) (Will need staff at ALF to be available to assist to BR, gait)     Equipment Recommendations  None recommended by PT    Recommendations for Other Services       Precautions / Restrictions Precautions Precautions: Back Precaution Comments: Pt cannot verbalize his precautions  Required Braces or Orthoses: Spinal Brace Spinal Brace: Thoracolumbosacral orthotic Restrictions Weight Bearing Restrictions: No    Mobility  Bed Mobility Overal bed mobility: Needs Assistance Bed Mobility: Sit to Sidelying Rolling: Mod assist Sidelying to sit: Mod assist     Sit to sidelying: Mod assist General bed mobility comments: Lifted legs, prompted body mechanics  Transfers Overall transfer level: Needs assistance Equipment used: Rolling walker (2 wheeled) Transfers: Sit to/from [employer] Transfers Sit to Stand: Min assist;Mod assist;Max assist (depending on surface height) Stand pivot transfers: Min assist       General transfer comment: cues for safety and correct hand placement using RW, requires increased time  Ambulation/Gait Ambulation/Gait assistance: Min assist (dense safety cues and correction sequence,  stay in walker) Ambulation Distance (Feet): 20 Feet Assistive device: Rolling walker (2 wheeled);1 person hand held assist Gait Pattern/deviations: Step-through pattern;Wide base of support;Trunk flexed;Drifts right/left Gait velocity: reduced Gait velocity interpretation: Below normal speed for age/gender General Gait Details: slow pace but able to assist with gait in brace   Stairs            Wheelchair Mobility    Modified Rankin (Stroke Patients Only)       Balance Overall balance assessment: Needs assistance Sitting-balance support: Feet supported;Bilateral upper extremity supported (sitting on commode used walker for upper body support) Sitting balance-Leahy Scale: Fair   Postural control: Other (comment) (forward lean) Standing balance support: Bilateral upper extremity supported Standing balance-Leahy Scale: Poor Standing balance comment: assistance for correct use of walker as pt is distracted by abd pain from constipation                    Cognition Arousal/Alertness: Awake/alert Behavior During Therapy: WFL for tasks assessed/performed Overall Cognitive Status: Within Functional Limits for tasks assessed Area of Impairment: Problem solving;Safety/judgement     Memory: Decreased recall of precautions;Decreased short-term memory   Safety/Judgement: Decreased awareness of safety;Decreased awareness of deficits   Problem Solving: Difficulty sequencing;Requires verbal cues;Requires tactile cues      Exercises      General Comments General comments (skin integrity, edema, etc.): Pt is planning to go to ALF but is because he had OBS admission.  His ability to tolerate TLSO is limited by edge on sternal pad.        Pertinent Vitals/Pain Pain Assessment: Faces Pain Score: 6  Faces  Pain Scale: Hurts even more Pain Location: abdomen Pain Descriptors / Indicators: Cramping Pain Intervention(s): Other (comment) (Pt is going to receive an enema and  will alleviate his pain)    Home Living                      Prior Function            PT Goals (current goals can now be found in the care plan section) Acute Rehab PT Goals Patient Stated Goal: go back to ALF with his wife Progress towards PT goals: Progressing toward goals    Frequency  Min 3X/week    PT Plan Current plan remains appropriate    Co-evaluation             End of Session Equipment Utilized During Treatment: Back brace Activity Tolerance: Patient limited by fatigue;Patient limited by pain Patient left: in bed;with call bell/phone within reach;with family/visitor present     Time: 1212-1236 PT Time Calculation (min) (ACUTE ONLY): 24 min  Charges:  $Gait Training: 8-22 mins $Therapeutic Activity: 8-22 mins                    G Codes:      Ramond Dial 06/15/2014, 1:05 PM   Mee Hives, PT MS Acute Rehab Dept. Number: [phone]

## 2014-05-28 NOTE — Progress Notes (Signed)
Assessment unchanged. Pt discharged via wc with NT and daughter. Daughter to transport pt to [facility] via personal car. TLSO brace on. Daughter presented with packet as prepared by SW to present to facility upon arrival.

## 2014-05-28 NOTE — Progress Notes (Signed)
Pt ready to return to [facility] today. PT approved transport by car. NSG reviewed d/c summary, scripts, avs. Scripts included in d/c packet. ALF reviewed d/c info prior to discharge. NSG to provide d/c packet to pt/daughter prior d/c. Pt / daughter are in agreement with d/c to ALF today.  Werner Lean LCSW [phone]

## 2014-05-28 NOTE — Progress Notes (Signed)
Second large BM noted since SMOG enema given. Rested quietly on left side with expression of pain. Stated " Just sore in my belly. I feel so much better." Daughter at bedside.

## 2014-05-28 NOTE — Progress Notes (Signed)
UR completed 

## 2014-05-28 NOTE — Progress Notes (Signed)
ANTIBIOTIC CONSULT NOTE - INITIAL  Pharmacy Consult for Levaquin Indication: CAP  No Known Allergies  Patient Measurements: Height: 5\' 7"  (170.2 cm) Weight: 164 lb 10.9 oz (74.7 kg) IBW/kg (Calculated) : 66.1  Vital Signs: Temp: 98.9 F (37.2 C) (03/15 0553) Temp Source: Oral (03/15 0553) BP: 110/60 mmHg (03/15 0834) Pulse Rate: 72 (03/15 0553) Intake/Output from previous day: 03/14 0701 - 03/15 0700 In: 1200 [P.O.:600; I.V.:600] Out: 106 [Urine:106] Intake/Output from this shift: Total I/O In: 480 [P.O.:480] Out: 125 [Urine:125]  Labs:  Recent Labs  05/26/14 0932 05/27/14 1352  WBC 5.7 8.8  HGB 13.1 12.7*  PLT 159 143*  CREATININE 1.34 1.34   Estimated Creatinine Clearance: 37 mL/min (by C-G formula based on Cr of 1.34). No results for input(s): VANCOTROUGH, VANCOPEAK, VANCORANDOM, GENTTROUGH, GENTPEAK, GENTRANDOM, TOBRATROUGH, TOBRAPEAK, TOBRARND, AMIKACINPEAK, AMIKACINTROU, AMIKACIN in the last 72 hours.   Microbiology: No results found for this or any previous visit (from the past 720 hour(s)).  Medical History: Past Medical History  Diagnosis Date  . Arthritis   . Hypertension   . Myocardial infarction 2014    mild"no symptoms" was told 2 months ago  . Pigmented skin lesions 07-27-12    generalized, never any skin cancer dx.  . Edema of extremities     lower extremities,feet; compression socks worned  . Kidney stones 07-27-12    hx. kidney stones, at present left ureteral stone  . Gastric ulcer 09/2012  . Skin cancer 10/27/12    poorly diff squamous cell carcinoma- left forehead  . CHF (congestive heart failure)   . Chronic kidney disease (CKD), stage III (moderate)   . 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 Hx of radiation therapy 01/15/13- 02/13/13    left temple 5000 cGy 20 sessions  . BPH (benign prostatic hyperplasia)   . Neuropathy     Medications:  Scheduled:  . carvedilol  3.125 mg Oral Q breakfast  . docusate sodium  100  mg Oral BID  . enoxaparin (LOVENOX) injection  40 mg Subcutaneous Q24H  . finasteride  5 mg Oral Daily  . levofloxacin (LEVAQUIN) IV  750 mg Intravenous Once  . [START ON 05/30/2014] levofloxacin  750 mg Oral Q48H  . pantoprazole  40 mg Oral Daily  . polyethylene glycol  34 g Oral Once  . sorbitol, milk of mag, mineral oil, glycerin (SMOG) enema  960 mL Rectal Once  . spironolactone  25 mg Oral Daily  . tamsulosin  0.4 mg Oral Daily   Infusions:   PRN: acetaminophen **OR** acetaminophen, alum & mag hydroxide-simeth, bisacodyl, guaiFENesin, magnesium hydroxide, morphine, morphine injection, nitroGLYCERIN, ondansetron (ZOFRAN) IV  Assessment: 81 yo male ALF resident recently diagnosed with peripheral neuropathy who was admitted 3/13 s/p fall.  On 3/15, pharmacy is consulted to dose Levaquin for CAP.   3/15 >> Levaquin >>  Tmax: afebrile WBC: 8.8k Renal: SCr 1.34, CrCl 37 ml/min CG  No cultures ordered   Goal of Therapy:  Eradication of infection Dose appropriate for indication and renal function  Plan:   Levaquin 750mg  IV x 1, then 750mg  PO q48h Follow up renal function & cultures, clinical course  Peggyann Juba, PharmD, BCPS Pager: [phone] 05/28/2014,9:51 AM

## 2014-08-07 ENCOUNTER — Ambulatory Visit: Payer: Medicare Other | Admitting: Podiatry

## 2014-09-14 IMAGING — US US RADIOLOGIST EVAL AND MGMT
1 series · 12 of 16 positions shown · non-contrast
Comparison: none

[Series 1: us radiologist eval and mgmt · 12 of 55 slices shown]
[im 1/55]
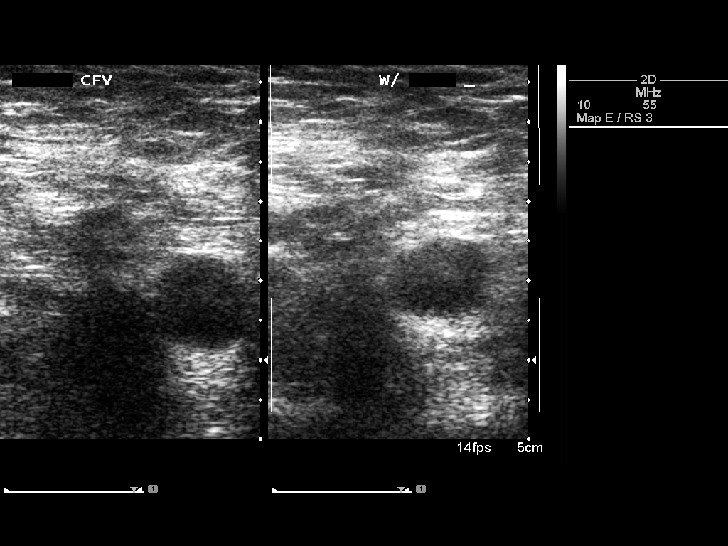
[im 8/55]
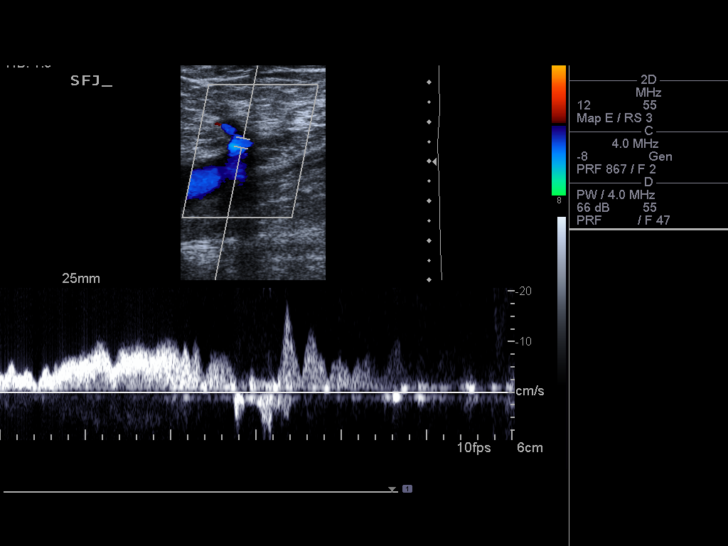
[im 11/55]
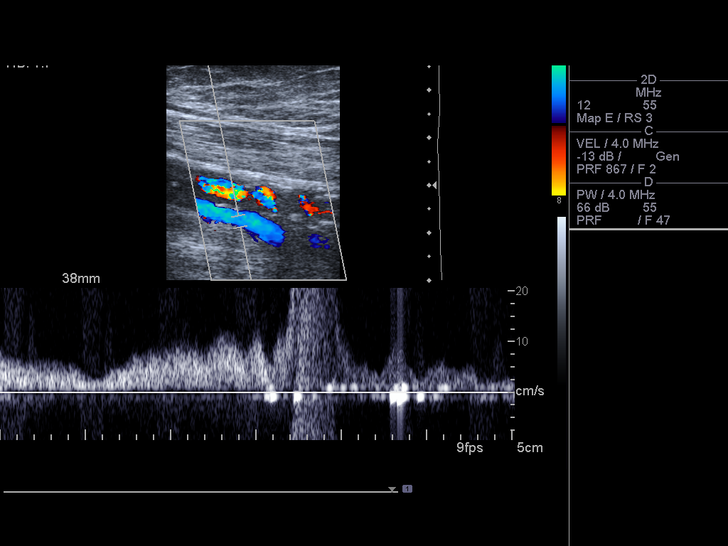
[im 15/55]
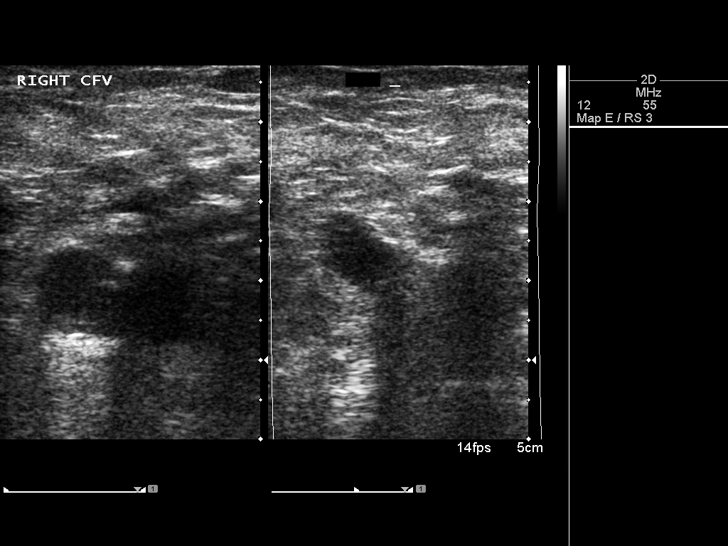
[im 22/55]
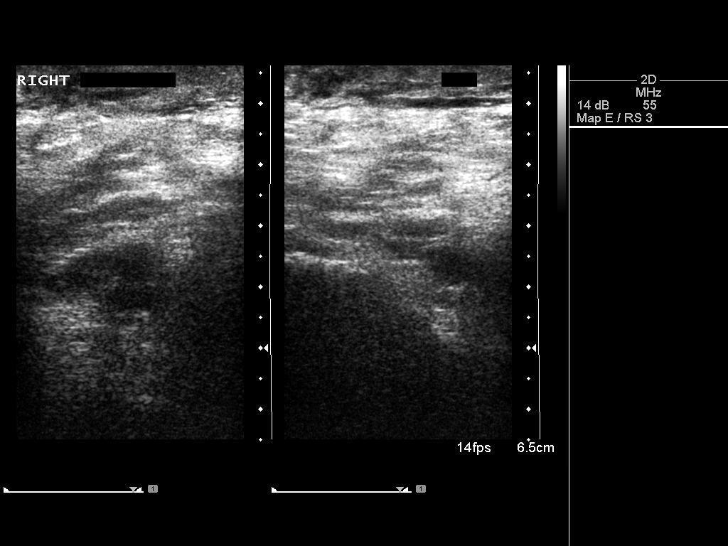
[im 26/55]
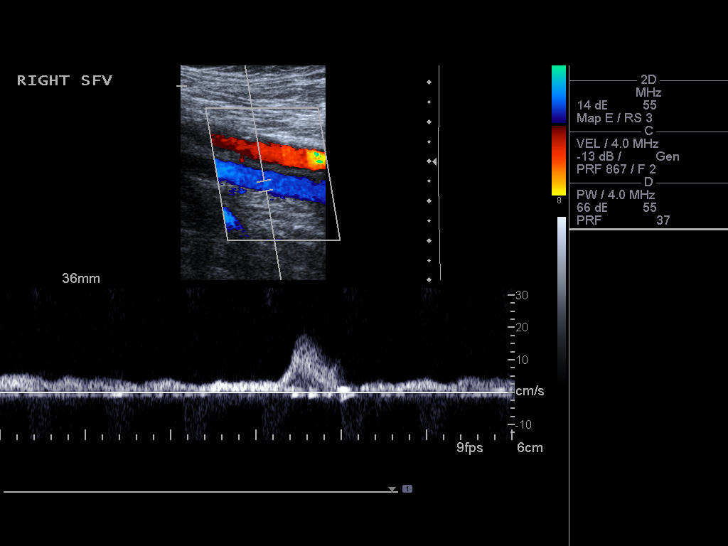
[im 29/55]
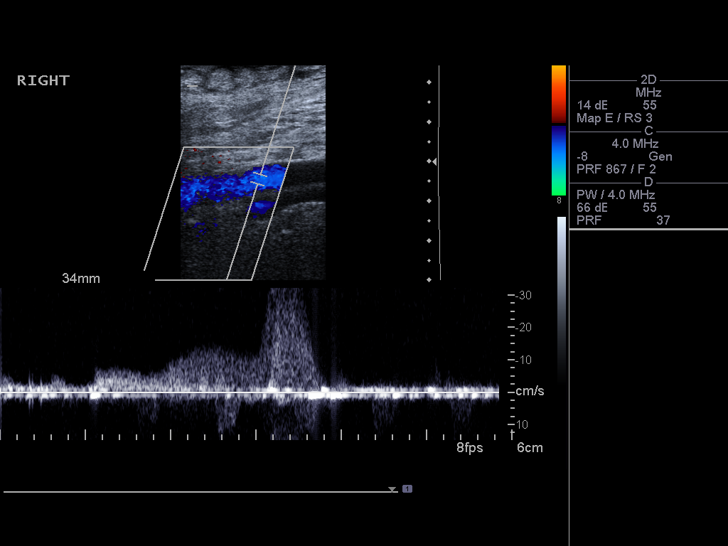
[im 37/55]
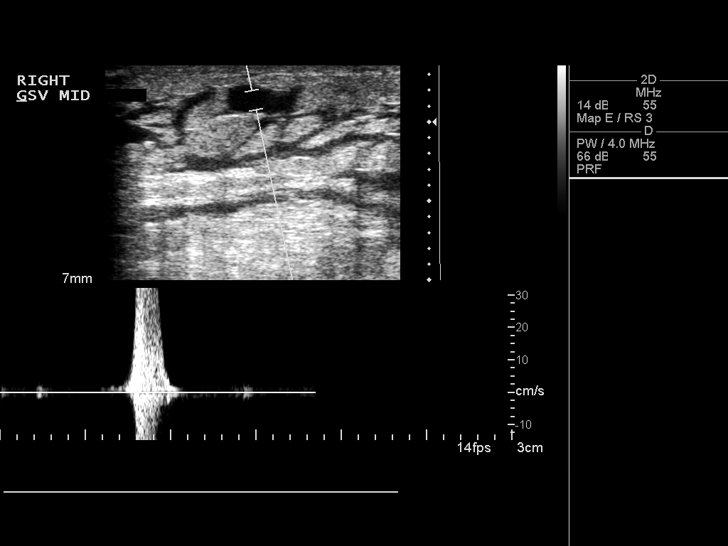
[im 40/55]
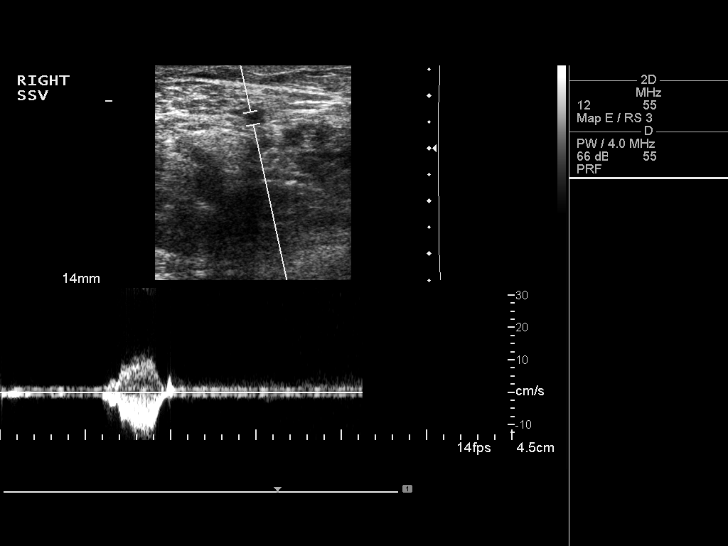
[im 44/55]
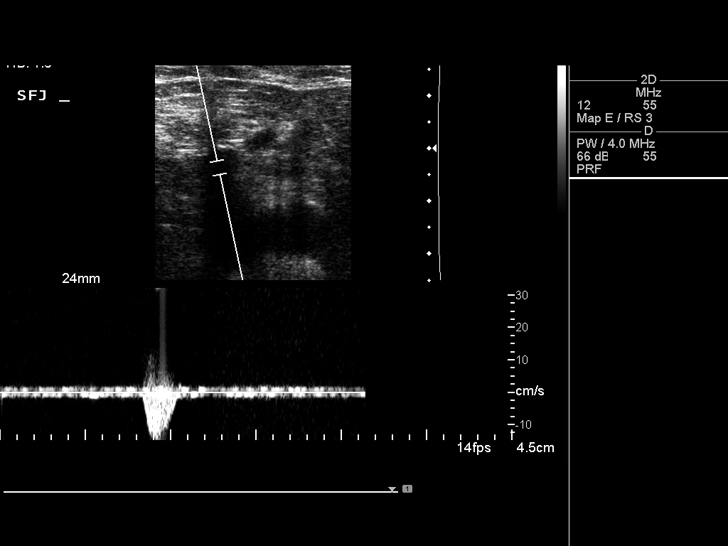
[im 51/55]
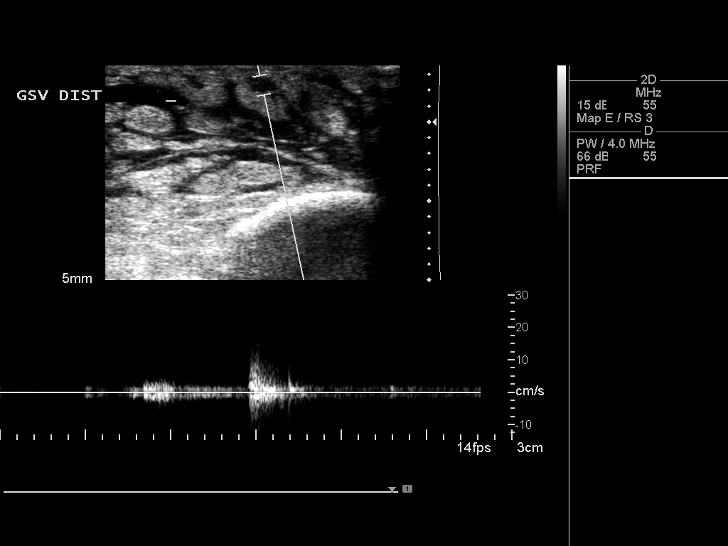
[im 55/55]
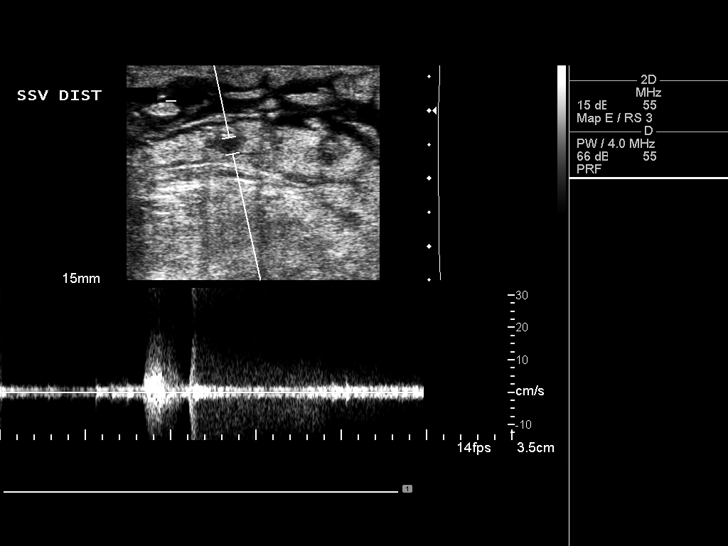

[12 of 16 positions shown; findings below may reference images not displayed]

ESTABLISHED PATIENT OFFICE VISIT - LEVEL III ([DATE]

Orion Visconti, M.D.

RE:  Weronika Grubb (DOB: 10/14/1925)

Dear Dr. Sagastume:

Thank you for the referral of your patient Weronika Grubb for
consultation regarding his bilateral lower extremity swelling and
pain.

As you know, this patient has had about two years of worsening
lower extremity swelling and pain primarily infrapopliteal.  He had
a fall back in [REDACTED] and he feels like this exacerbated his
symptoms.  Currently he is largely disabled secondary to his lower
extremity symptoms, sitting for 20-22 hours of the day in his
recliner.  However, elevation does not seem to help the symptoms.
He complains of bilateral calf and ankle swelling, pain, tiredness
and redness.  He has a small lesion on the anterior aspect of his
right calf with an overlying scab, no weeping.  No history of DVT,
PE or superficial thrombophlebitis.  No previous varicose of spider
vein treatments.  No family history of varicose or spider veins.
He is a retired truck driver.  He is using Tramadol 50 mg q day for
pain.

Past medical history is significant for type 2 diabetes, arthritis,
previous appendectomy, peptic ulcer disease, right arm RSD.

Current medications:  Tramadol 50 mg prn pain, spironolactone 25 mg
q day.

No known drug allergies.  No allergy to latex or iodinated
contrast.

He is married, with three children, seven siblings, lives in [place].  No alcohol or tobacco use.  Daily caffeine use.  No
recreational drugs.

On exam, he is of normal mood and affect.  5'7", 180 pounds.  Blood
percent on room air.

He has 3-4+ pitting edema in his mid to lower calf and ankle
bilaterally.  The skin is mildly reddened without significant
chronic discoloration.  There is a small scab along the anterior
right calf.  No weeping wound or trophic change.  Palpable pulses.

Today's venous ultrasound, reported separately, shows a normal deep
venous system bilaterally.  There is no significant valvular
incompetence or reflux of the superficial venous system to suggest
a primarily venous etiology of his lower extremity swelling or
pain.

We spent the majority of the 45 minute consultation discussing the
pathophysiology of varicose vein disease.  We discussed his
ultrasound findings, specifically that there is no significant
varicose vein disease.  We did discuss that there are a multitude
of potential etiologies of bilateral lower extremity swelling, and
his workup will likely continue.

Again, I appreciate your allowing us to participate in the care of
this pleasant patient.  As he does not have any significant
superficial venous valvular incompetence or reflux, we really have
nothing to add from a treatment standpoint to help with his lower
extremity swelling or pain.  He may want to consider compression
garments while his workup continues.

Please let me know if I can be of further assistance.  He will
follow-up primarily with you.

Sincerely,

/[REDACTED]

C:  Benrabah Etoil, M.D.

## 2015-03-02 ENCOUNTER — Emergency Department (HOSPITAL_COMMUNITY): Payer: Medicare Other

## 2015-03-02 ENCOUNTER — Encounter (HOSPITAL_COMMUNITY): Payer: Self-pay

## 2015-03-02 ENCOUNTER — Emergency Department (HOSPITAL_COMMUNITY)
Admission: EM | Admit: 2015-03-02 | Discharge: 2015-03-02 | Disposition: A | Discharge status: Home / Self Care | Payer: Medicare Other | Source: Home / Self Care | Attending: Emergency Medicine | Admitting: Emergency Medicine

## 2015-03-02 DIAGNOSIS — Z872 Personal history of diseases of the skin and subcutaneous tissue: Secondary | ICD-10-CM

## 2015-03-02 DIAGNOSIS — Z87891 Personal history of nicotine dependence: Secondary | ICD-10-CM | POA: Insufficient documentation

## 2015-03-02 DIAGNOSIS — I509 Heart failure, unspecified: Secondary | ICD-10-CM

## 2015-03-02 DIAGNOSIS — T1490XA Injury, unspecified, initial encounter: Secondary | ICD-10-CM

## 2015-03-02 DIAGNOSIS — Z79899 Other long term (current) drug therapy: Secondary | ICD-10-CM | POA: Insufficient documentation

## 2015-03-02 DIAGNOSIS — M1990 Unspecified osteoarthritis, unspecified site: Secondary | ICD-10-CM

## 2015-03-02 DIAGNOSIS — R531 Weakness: Secondary | ICD-10-CM | POA: Diagnosis not present

## 2015-03-02 DIAGNOSIS — S3993XA Unspecified injury of pelvis, initial encounter: Secondary | ICD-10-CM | POA: Insufficient documentation

## 2015-03-02 DIAGNOSIS — W19XXXA Unspecified fall, initial encounter: Secondary | ICD-10-CM

## 2015-03-02 DIAGNOSIS — Z85828 Personal history of other malignant neoplasm of skin: Secondary | ICD-10-CM | POA: Insufficient documentation

## 2015-03-02 DIAGNOSIS — I129 Hypertensive chronic kidney disease with stage 1 through stage 4 chronic kidney disease, or unspecified chronic kidney disease: Secondary | ICD-10-CM

## 2015-03-02 DIAGNOSIS — S29002A Unspecified injury of muscle and tendon of back wall of thorax, initial encounter: Secondary | ICD-10-CM

## 2015-03-02 DIAGNOSIS — N183 Chronic kidney disease, stage 3 (moderate): Secondary | ICD-10-CM | POA: Insufficient documentation

## 2015-03-02 DIAGNOSIS — Y998 Other external cause status: Secondary | ICD-10-CM | POA: Insufficient documentation

## 2015-03-02 DIAGNOSIS — Y9389 Activity, other specified: Secondary | ICD-10-CM | POA: Insufficient documentation

## 2015-03-02 DIAGNOSIS — N201 Calculus of ureter: Secondary | ICD-10-CM | POA: Insufficient documentation

## 2015-03-02 DIAGNOSIS — I252 Old myocardial infarction: Secondary | ICD-10-CM

## 2015-03-02 DIAGNOSIS — Y9289 Other specified places as the place of occurrence of the external cause: Secondary | ICD-10-CM

## 2015-03-02 DIAGNOSIS — W1789XA Other fall from one level to another, initial encounter: Secondary | ICD-10-CM

## 2015-03-02 DIAGNOSIS — N390 Urinary tract infection, site not specified: Secondary | ICD-10-CM | POA: Diagnosis not present

## 2015-03-02 LAB — URINALYSIS, ROUTINE W REFLEX MICROSCOPIC
BILIRUBIN URINE: NEGATIVE
GLUCOSE, UA: NEGATIVE mg/dL
Hgb urine dipstick: NEGATIVE
KETONES UR: NEGATIVE mg/dL
LEUKOCYTES UA: NEGATIVE
Nitrite: NEGATIVE
PH: 5.5 (ref 5.0–8.0)
Protein, ur: NEGATIVE mg/dL
Specific Gravity, Urine: 1.017 (ref 1.005–1.030)

## 2015-03-02 LAB — COMPREHENSIVE METABOLIC PANEL
ALBUMIN: 3.1 g/dL — AB (ref 3.5–5.0)
ALK PHOS: 86 U/L (ref 38–126)
ALT: 12 U/L — ABNORMAL LOW (ref 17–63)
AST: 17 U/L (ref 15–41)
Anion gap: 7 (ref 5–15)
BILIRUBIN TOTAL: 0.5 mg/dL (ref 0.3–1.2)
BUN: 13 mg/dL (ref 6–20)
CALCIUM: 9 mg/dL (ref 8.9–10.3)
CO2: 23 mmol/L (ref 22–32)
Chloride: 107 mmol/L (ref 101–111)
Creatinine, Ser: 1.35 mg/dL — ABNORMAL HIGH (ref 0.61–1.24)
GFR calc Af Amer: 53 mL/min — ABNORMAL LOW (ref >60)
GFR calc non Af Amer: 46 mL/min — ABNORMAL LOW (ref >60)
GLUCOSE: 103 mg/dL — AB (ref 65–99)
Potassium: 4.2 mmol/L (ref 3.5–5.1)
Sodium: 137 mmol/L (ref 135–145)
TOTAL PROTEIN: 5.7 g/dL — AB (ref 6.5–8.1)

## 2015-03-02 LAB — CBC WITH DIFFERENTIAL/PLATELET
Basophils Absolute: 0 10*3/uL (ref 0.0–0.1)
Basophils Relative: 0 %
Eosinophils Absolute: 0.2 10*3/uL (ref 0.0–0.7)
Eosinophils Relative: 2 %
HCT: 41.1 % (ref 39.0–52.0)
HEMOGLOBIN: 13.7 g/dL (ref 13.0–17.0)
LYMPHS ABS: 1.9 10*3/uL (ref 0.7–4.0)
Lymphocytes Relative: 24 %
MCH: 30.2 pg (ref 26.0–34.0)
MCHC: 33.3 g/dL (ref 30.0–36.0)
MCV: 90.5 fL (ref 78.0–100.0)
Monocytes Absolute: 0.7 10*3/uL (ref 0.1–1.0)
Monocytes Relative: 9 %
NEUTROS ABS: 5.1 10*3/uL (ref 1.7–7.7)
NEUTROS PCT: 65 %
Platelets: 177 10*3/uL (ref 150–400)
RBC: 4.54 MIL/uL (ref 4.22–5.81)
RDW: 13.9 % (ref 11.5–15.5)
WBC: 7.9 10*3/uL (ref 4.0–10.5)

## 2015-03-02 LAB — LIPASE, BLOOD: Lipase: 39 U/L (ref 11–51)

## 2015-03-02 MED ORDER — MORPHINE SULFATE (PF) 2 MG/ML IV SOLN
2.0000 mg | Freq: Once | INTRAVENOUS | Status: AC
  Administered 2015-03-02: 2 mg via INTRAVENOUS
  Filled 2015-03-02: qty 1

## 2015-03-02 MED ORDER — IOHEXOL 300 MG/ML  SOLN
80.0000 mL | Freq: Once | INTRAMUSCULAR | Status: AC | PRN
  Administered 2015-03-02: 80 mL via INTRAVENOUS

## 2015-03-02 MED ORDER — ONDANSETRON HCL 4 MG/2ML IJ SOLN
4.0000 mg | Freq: Once | INTRAMUSCULAR | Status: AC
  Administered 2015-03-02: 4 mg via INTRAVENOUS
  Filled 2015-03-02: qty 2

## 2015-03-02 MED ORDER — ACETAMINOPHEN 500 MG PO TABS
1000.0000 mg | ORAL_TABLET | Freq: Once | ORAL | Status: AC
  Administered 2015-03-02: 1000 mg via ORAL
  Filled 2015-03-02: qty 2

## 2015-03-02 NOTE — ED Notes (Signed)
Patient and patient's daughter verbalized understanding of discharge instructions and follow-up information and deny any further needs or questions. VS stable, patient assisted with getting dressed and wheeled to the ED entrance.

## 2015-03-02 NOTE — ED Notes (Signed)
Patient transported to CT 

## 2015-03-02 NOTE — ED Notes (Signed)
Per EMS - pt from [facility]. Pt attempting to sit down at table, fell, hit head on table but denies LOC. Pt not on blood thinners. C/o left hip and leg pain - no shortening or rotation noted. Mild pain w/ palpation. Upper thoracic tenderness, placed in c-collar. VSS. 2 doses of 26mcg fentanyl with some relief of pain. Pt nauseated and vomited 1x en route to [facility]. Given 4mg  zofran and no longer nauseated.

## 2015-03-02 NOTE — ED Notes (Signed)
Pt transported to xray and CT

## 2015-03-02 NOTE — ED Notes (Signed)
Dr. Floyd at bedside. 

## 2015-03-02 NOTE — Discharge Instructions (Signed)
Tylenol 1-2 tabs po q4h prn ° °

## 2015-03-02 NOTE — ED Provider Notes (Signed)
CSN: [id number]     Arrival date & time 03/02/15  1242 History   First MD Initiated Contact with Patient 03/02/15 1244     Chief Complaint  Patient presents with  . Fall     (Consider location/radiation/quality/duration/timing/severity/associated sxs/prior Treatment) Patient is a 81 y.o. male presenting with fall. The history is provided by the patient.  Fall This is a recurrent problem. The current episode started less than 1 hour ago. The problem occurs constantly. The problem has not changed since onset.Pertinent negatives include no chest pain, no abdominal pain, no headaches and no shortness of breath. The symptoms are aggravated by walking, bending and twisting. Nothing relieves the symptoms. He has tried nothing for the symptoms. The treatment provided no relief.    81 yo M  With a chief complaint of a fall. Patient states he was trying to sit into a chair to eat lunch when it slid out from under him and he landed on his left side. Struck his head does not remember the exact events but does not feel like he passed out. Complaining mostly of left-sided pain. Unable to walk at onset.  EMS was called.  Past Medical History  Diagnosis Date  . Arthritis   . Hypertension   . Myocardial infarction [facility]) 2014    mild"no symptoms" was told 2 months ago  . Pigmented skin lesions 07-27-12    generalized, never any skin cancer dx.  . Edema of extremities     lower extremities,feet; compression socks worned  . Kidney stones 07-27-12    hx. kidney stones, at present left ureteral stone  . Gastric ulcer 09/2012  . Skin cancer 10/27/12    poorly diff squamous cell carcinoma- left forehead  . CHF (congestive heart failure) ([place])   . Chronic kidney disease (CKD), stage III (moderate)   . 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 Hx of radiation therapy 01/15/13- 02/13/13    left temple 5000 cGy 20 sessions  . BPH (benign prostatic hyperplasia)   . Neuropathy [facility])    Past  Surgical History  Procedure Laterality Date  . Cataract extraction, bilateral    . Wrist surgery      "nerve repair"  . Cystoscopy with retrograde pyelogram, ureteroscopy and stent placement Left 07/31/2012    Procedure: CYSTOSCOPY WITH LEFT RETROGRADE PYELOGRAM, LEFT URETEROSCOPY;  Surgeon: Claybon Jabs, MD;  Location: [facility];  Service: Urology;  Laterality: Left;  . Cystoscopy w/ ureteral stent placement  02/2002    on left for ureteral stone  . Esophagogastroduodenoscopy N/A 09/14/2012    Procedure: ESOPHAGOGASTRODUODENOSCOPY (EGD);  Surgeon: Jerene Bears, MD;  Location: [facility];  Service: Gastroenterology;  Laterality: N/A;  . Appendectomy    . Esophagogastroduodenoscopy N/A 11/14/2012    Procedure: ESOPHAGOGASTRODUODENOSCOPY (EGD);  Surgeon: Jerene Bears, MD;  Location: [facility];  Service: Gastroenterology;  Laterality: N/A;  . Mohs surgery Left 11/28/12    forehead  . Skin biopsy Left 10/27/12    left forehead near frontal scalp  . Vasectomy     Family History  Problem Relation Age of Onset  . Colon cancer Neg Hx   . Heart attack Father   . Arthritis Mother    Social History  Substance Use Topics  . Smoking status: Former Smoker -- 0.50 packs/day for 22 years    Types: Cigarettes    Quit date: 07/27/1973  . Smokeless tobacco: Never Used  . Alcohol Use: No  Review of Systems  Constitutional: Negative for fever and chills.  HENT: Negative for congestion and facial swelling.   Eyes: Negative for discharge and visual disturbance.  Respiratory: Negative for shortness of breath.   Cardiovascular: Negative for chest pain and palpitations.  Gastrointestinal: Negative for vomiting, abdominal pain and diarrhea.  Musculoskeletal: Positive for myalgias, back pain and arthralgias.  Skin: Negative for color change and rash.  Neurological: Negative for tremors, syncope and headaches.  Psychiatric/Behavioral: Negative for confusion and dysphoric mood.      Allergies    Review of patient's allergies indicates no known allergies.  Home Medications   Prior to Admission medications   Medication Sig Start Date End Date Taking? Authorizing Provider  acetaminophen (TYLENOL) 500 MG tablet Take 2 tablets (1,000 mg total) by mouth every 8 (eight) hours as needed for mild pain, fever or headache. Patient taking differently: Take 500 mg by mouth every 4 (four) hours as needed for mild pain, fever or headache.  05/28/14  Yes [name], MD  carvedilol (COREG) 3.125 MG tablet Take 3.125 mg by mouth daily.    Yes Historical Provider, MD  finasteride (PROSCAR) 5 MG tablet Take 5 mg by mouth daily.  11/02/12  Yes Historical Provider, MD  loperamide (IMODIUM) 2 MG capsule Take 2 mg by mouth as needed for diarrhea or loose stools. Do not exceed 8 doses in 24 hours   Yes Historical Provider, MD  neomycin-bacitracin-polymyxin (NEOSPORIN) [phone] ointment Apply 1 application topically as needed (for minor skin tears or abrasions).    Yes Historical Provider, MD  nitroGLYCERIN (NITROSTAT) 0.4 MG SL tablet Place 0.4 mg under the tongue every 5 (five) minutes as needed for chest pain.   Yes Historical Provider, MD  pantoprazole (PROTONIX) 40 MG tablet Take 1 tablet (40 mg total) by mouth daily. Patient taking differently: Take 40 mg by mouth daily before breakfast.  05/28/14  Yes [name], MD  polyethylene glycol (MIRALAX / GLYCOLAX) packet Take 17 g by mouth daily. Patient taking differently: Take 17 g by mouth daily as needed (constipation).  05/28/14  Yes [name], MD  spironolactone (ALDACTONE) 25 MG tablet Take 25 mg by mouth daily.  04/15/14  Yes Historical Provider, MD  tamsulosin (FLOMAX) 0.4 MG CAPS Take 0.4 mg by mouth daily.    Yes Historical Provider, MD  Vitamin D, Ergocalciferol, (DRISDOL) 50000 UNITS CAPS capsule Take 50,000 Units by mouth every Wednesday.  02/13/15  Yes Historical Provider, MD  docusate sodium (COLACE) 100 MG capsule Take 1 capsule (100  mg total) by mouth 2 (two) times daily. Patient not taking: Reported on 03/02/2015 05/28/14   [name], MD  traMADol (ULTRAM) 50 MG tablet Take 1 tablet (50 mg total) by mouth every 6 (six) hours as needed for severe pain. Patient not taking: Reported on 03/02/2015 05/28/14   [name], MD   BP 121/66 mmHg  Pulse 76  Temp(Src) 97.7 F (36.5 C) (Oral)  Resp 15  SpO2 96% Physical Exam  Constitutional: He is oriented to person, place, and time. He appears well-developed and well-nourished.  HENT:  Head: Normocephalic and atraumatic.   No noted signs of trauma.   Eyes: EOM are normal. Pupils are equal, round, and reactive to light.  Neck: Normal range of motion. Neck supple. No JVD present.  Cardiovascular: Normal rate and regular rhythm.  Exam reveals no gallop and no friction rub.   No murmur heard. Pulmonary/Chest: No respiratory distress. He has no wheezes.    Abdominal:  He exhibits no distension. There is no tenderness. There is no rebound and no guarding.  Musculoskeletal: Normal range of motion.   Tenderness about the left pelvis. Stable on compression. Guarding with internal and external rotation appears to have significant pain with this. Pulse motor and sensation intact distally.  Neurological: He is alert and oriented to person, place, and time.  Skin: No rash noted. No pallor.  Psychiatric: He has a normal mood and affect. His behavior is normal.  Nursing note and vitals reviewed.   ED Course  Procedures (including critical care time) Labs Review Labs Reviewed  COMPREHENSIVE METABOLIC PANEL - Abnormal; Notable for the following:    Glucose, Bld 103 (*)    Creatinine, Ser 1.35 (*)    Total Protein 5.7 (*)    Albumin 3.1 (*)    ALT 12 (*)    GFR calc non Af Amer 46 (*)    GFR calc Af Amer 53 (*)    All other components within normal limits  CBC WITH DIFFERENTIAL/PLATELET  LIPASE, BLOOD  URINALYSIS, ROUTINE W REFLEX MICROSCOPIC (NOT AT [facility])     Imaging Review Dg Chest 1 View  03/02/2015  CLINICAL DATA:  Fall EXAM: CHEST 1 VIEW COMPARISON:  05/26/2004 FINDINGS: Normal heart size. Clear lungs. No pneumothorax or pleural effusion. IMPRESSION: No active disease. Electronically Signed   By: Marybelle Killings M.D.   On: 03/02/2015 13:58   Dg Tibia/fibula Left  03/02/2015  CLINICAL DATA:  Fall EXAM: LEFT TIBIA AND FIBULA - 2 VIEW COMPARISON:  None. FINDINGS: No acute fracture.  No dislocation. IMPRESSION: No acute bony pathology. Electronically Signed   By: Marybelle Killings M.D.   On: 03/02/2015 14:01   Ct Head Wo Contrast  03/02/2015  CLINICAL DATA:  Fall from standing position with head injury. No loss of consciousness. Neck pain. EXAM: CT HEAD WITHOUT CONTRAST CT CERVICAL SPINE WITHOUT CONTRAST TECHNIQUE: Multidetector CT imaging of the head and cervical spine was performed following the standard protocol without intravenous contrast. Multiplanar CT image reconstructions of the cervical spine were also generated. COMPARISON:  Head CT 07/16/2013.  Cervical spine CT 11/20/2006. FINDINGS: CT HEAD FINDINGS There is no evidence of acute intracranial hemorrhage, mass lesion, brain edema or extra-axial fluid collection. The ventricles and subarachnoid spaces are diffusely prominent but stable. There is no CT evidence of acute cortical infarction. There is stable chronic periventricular and subcortical white matter disease. Intracranial vascular calcifications are noted. The visualized paranasal sinuses, mastoid air cells and middle ears are clear. The calvarium is intact. CT CERVICAL SPINE FINDINGS The alignment is normal. There is no evidence of acute fracture or paraspinal abnormality. The disc spaces are relatively preserved. There are stable anterior osteophytes and mild uncinate spurring throughout the cervical spine. Ossification of the ligamentum nuchae is noted. No acute soft tissue findings are seen. Carotid arterial calcifications are present  bilaterally. There are postsurgical changes at both lung apices. IMPRESSION: 1. No acute intracranial or calvarial findings. Stable atrophy and chronic small vessel ischemic changes. 2. No evidence of acute cervical spine fracture, traumatic subluxation or static signs instability. Electronically Signed   By: Richardean Sale M.D.   On: 03/02/2015 16:12   Ct Chest W Contrast  03/02/2015  CLINICAL DATA:  Fall. EXAM: CT CHEST, ABDOMEN, AND PELVIS WITH CONTRAST TECHNIQUE: Multidetector CT imaging of the chest, abdomen and pelvis was performed following the standard protocol during bolus administration of intravenous contrast. CONTRAST:  26mL OMNIPAQUE IOHEXOL 300 MG/ML  SOLN COMPARISON:  09/12/2012 FINDINGS: CT CHEST FINDINGS Mediastinum/Nodes: The heart size appears normal. No pericardial effusion identified. Aortic atherosclerosis noted. Calcification within the LAD coronary artery noted. The trachea is patent and is midline. Normal appearance of the esophagus. Lungs/Pleura: No pleural fluid identified. No evidence for pulmonary contusion or pneumothorax. Musculoskeletal: There is degenerative disc disease noted within the thoracic spine. No aggressive lytic or sclerotic bone lesions identified. CT ABDOMEN PELVIS FINDINGS Hepatobiliary: There is no suspicious liver abnormality. Stones are noted within the dependent portion of the gallbladder. No gallbladder wall thickening or pericholecystic fluid. No biliary dilatation. Pancreas: Negative. Spleen: No focal splenic abnormality. Adrenals/Urinary Tract: The adrenal glands are negative. Normal appearance of the right kidney. Left renal atrophy identified. Nonobstructing calculus within the posterior aspect of the right kidney measures 4 mm. Interval improvement in previously noted left-sided hydronephrosis and hydroureter. Stomach/Bowel: The stomach is normal. The small bowel loops have a normal course and caliber. No bowel obstruction. No pathologic dilatation of  the large bowel loops. Numerous distal colonic diverticula noted. No acute inflammation. Vascular/Lymphatic: Calcified atherosclerotic disease involves the abdominal aorta. No aneurysm. No enlarged retroperitoneal or mesenteric adenopathy. No enlarged pelvic or inguinal lymph nodes. Reproductive: The prostate gland and seminal vesicles are within normal limits. Other: No free fluid or fluid collections within the abdomen or pelvis. Musculoskeletal:Stable chronic compression fracture involve T12 vertebra. Spondylosis noted within the lumbar spine. No aggressive lytic or sclerotic bone lesions. IMPRESSION: 1. No acute findings identified within the chest, abdomen or pelvis. 2. Aortic atherosclerosis and coronary artery calcification noted. 3. Chronic left renal atrophy. There has been interval improvement in previously noted left-sided hydronephrosis and hydroureter. Electronically Signed   By: Kerby Moors M.D.   On: 03/02/2015 16:17   Ct Cervical Spine Wo Contrast  03/02/2015  CLINICAL DATA:  Fall from standing position with head injury. No loss of consciousness. Neck pain. EXAM: CT HEAD WITHOUT CONTRAST CT CERVICAL SPINE WITHOUT CONTRAST TECHNIQUE: Multidetector CT imaging of the head and cervical spine was performed following the standard protocol without intravenous contrast. Multiplanar CT image reconstructions of the cervical spine were also generated. COMPARISON:  Head CT 07/16/2013.  Cervical spine CT 11/20/2006. FINDINGS: CT HEAD FINDINGS There is no evidence of acute intracranial hemorrhage, mass lesion, brain edema or extra-axial fluid collection. The ventricles and subarachnoid spaces are diffusely prominent but stable. There is no CT evidence of acute cortical infarction. There is stable chronic periventricular and subcortical white matter disease. Intracranial vascular calcifications are noted. The visualized paranasal sinuses, mastoid air cells and middle ears are clear. The calvarium is intact.  CT CERVICAL SPINE FINDINGS The alignment is normal. There is no evidence of acute fracture or paraspinal abnormality. The disc spaces are relatively preserved. There are stable anterior osteophytes and mild uncinate spurring throughout the cervical spine. Ossification of the ligamentum nuchae is noted. No acute soft tissue findings are seen. Carotid arterial calcifications are present bilaterally. There are postsurgical changes at both lung apices. IMPRESSION: 1. No acute intracranial or calvarial findings. Stable atrophy and chronic small vessel ischemic changes. 2. No evidence of acute cervical spine fracture, traumatic subluxation or static signs instability. Electronically Signed   By: Richardean Sale M.D.   On: 03/02/2015 16:12   Ct Abdomen Pelvis W Contrast  03/02/2015  CLINICAL DATA:  Fall. EXAM: CT CHEST, ABDOMEN, AND PELVIS WITH CONTRAST TECHNIQUE: Multidetector CT imaging of the chest, abdomen and pelvis was performed following the standard protocol during bolus administration of intravenous contrast. CONTRAST:  58mL  OMNIPAQUE IOHEXOL 300 MG/ML  SOLN COMPARISON:  09/12/2012 FINDINGS: CT CHEST FINDINGS Mediastinum/Nodes: The heart size appears normal. No pericardial effusion identified. Aortic atherosclerosis noted. Calcification within the LAD coronary artery noted. The trachea is patent and is midline. Normal appearance of the esophagus. Lungs/Pleura: No pleural fluid identified. No evidence for pulmonary contusion or pneumothorax. Musculoskeletal: There is degenerative disc disease noted within the thoracic spine. No aggressive lytic or sclerotic bone lesions identified. CT ABDOMEN PELVIS FINDINGS Hepatobiliary: There is no suspicious liver abnormality. Stones are noted within the dependent portion of the gallbladder. No gallbladder wall thickening or pericholecystic fluid. No biliary dilatation. Pancreas: Negative. Spleen: No focal splenic abnormality. Adrenals/Urinary Tract: The adrenal glands are  negative. Normal appearance of the right kidney. Left renal atrophy identified. Nonobstructing calculus within the posterior aspect of the right kidney measures 4 mm. Interval improvement in previously noted left-sided hydronephrosis and hydroureter. Stomach/Bowel: The stomach is normal. The small bowel loops have a normal course and caliber. No bowel obstruction. No pathologic dilatation of the large bowel loops. Numerous distal colonic diverticula noted. No acute inflammation. Vascular/Lymphatic: Calcified atherosclerotic disease involves the abdominal aorta. No aneurysm. No enlarged retroperitoneal or mesenteric adenopathy. No enlarged pelvic or inguinal lymph nodes. Reproductive: The prostate gland and seminal vesicles are within normal limits. Other: No free fluid or fluid collections within the abdomen or pelvis. Musculoskeletal:Stable chronic compression fracture involve T12 vertebra. Spondylosis noted within the lumbar spine. No aggressive lytic or sclerotic bone lesions. IMPRESSION: 1. No acute findings identified within the chest, abdomen or pelvis. 2. Aortic atherosclerosis and coronary artery calcification noted. 3. Chronic left renal atrophy. There has been interval improvement in previously noted left-sided hydronephrosis and hydroureter. Electronically Signed   By: Kerby Moors M.D.   On: 03/02/2015 16:17   Ct T-spine No Charge  03/02/2015  CLINICAL DATA:  Patient fell while sitting down to the table today. Patient struck his head. No loss of consciousness. Complaining of thoracic back pain. EXAM: CT THORACIC, AND LUMBAR SPINE WITHOUT CONTRAST TECHNIQUE: Multidetector CT imaging of the cervical, thoracic and lumbar spine was performed without intravenous contrast. Multiplanar CT image reconstructions were also generated. COMPARISON:  CT, 09/12/2012. FINDINGS: CT THORACIC SPINE FINDINGS There is no acute fracture. No spondylolisthesis. There is a Schmorl's node with mild depression of the central  anterior upper endplate at 624THL, stable from prior CT. All remaining thoracic vertebrae are normal in height. There is mild loss of disc height along the thoracic spine with anterior and lateral endplate osteophytes. The bones are demineralized. Central spinal canal is relatively well preserved. No evidence of significant disc bulging or disc herniation. Neural foramina are relatively well preserved. There is sub cm thyroid nodule on the left. No mediastinal or hilar masses or adenopathy. Moderate coronary artery calcifications are noted. Lungs show areas of peripheral scarring and mild lower lobe bronchiectasis. There is significant left renal atrophy. No acute soft tissue abnormality. CT LUMBAR SPINE FINDINGS No acute fracture or spondylolisthesis. There is depression of the central and anterior upper endplate of 624THL which is stable from the study dated 09/12/2012. No lumbar spine fractures. The discs are relatively well maintained in height. Endplate osteophytes are noted throughout the lumbar spine. There is no convincing disc herniation. Minor disc bulging is noted at L3-L4-L4-L5 and L5-S1. There are facet degenerative changes in the lower lumbar spine. Neural foraminal narrowing is noted moderate on the left at L4-L5 and mild on right. At L5-S1 is moderate to severe on the right  and moderate on the left. Soft tissue evaluation shows evidence of gallstones without acute cholecystitis. Left renal atrophy. Nonobstructing stone left kidney. Atherosclerotic changes along the aorta. Numerous diverticula noted along the colon mostly the left colon. No acute findings. IMPRESSION: 1. No acute abnormality. Specifically no evidence of acute fracture of the thoracolumbar spine. 2. Chronic mild compression fracture T12. 3. Chronic degenerative changes noted throughout the thoracolumbar spine. Bones are diffusely demineralized. Electronically Signed   By: Lajean Manes M.D.   On: 03/02/2015 16:35   Ct L-spine No  Charge  03/02/2015  CLINICAL DATA:  Patient fell while sitting down to the table today. Patient struck his head. No loss of consciousness. Complaining of thoracic back pain. EXAM: CT THORACIC, AND LUMBAR SPINE WITHOUT CONTRAST TECHNIQUE: Multidetector CT imaging of the cervical, thoracic and lumbar spine was performed without intravenous contrast. Multiplanar CT image reconstructions were also generated. COMPARISON:  CT, 09/12/2012. FINDINGS: CT THORACIC SPINE FINDINGS There is no acute fracture. No spondylolisthesis. There is a Schmorl's node with mild depression of the central anterior upper endplate at 624THL, stable from prior CT. All remaining thoracic vertebrae are normal in height. There is mild loss of disc height along the thoracic spine with anterior and lateral endplate osteophytes. The bones are demineralized. Central spinal canal is relatively well preserved. No evidence of significant disc bulging or disc herniation. Neural foramina are relatively well preserved. There is sub cm thyroid nodule on the left. No mediastinal or hilar masses or adenopathy. Moderate coronary artery calcifications are noted. Lungs show areas of peripheral scarring and mild lower lobe bronchiectasis. There is significant left renal atrophy. No acute soft tissue abnormality. CT LUMBAR SPINE FINDINGS No acute fracture or spondylolisthesis. There is depression of the central and anterior upper endplate of 624THL which is stable from the study dated 09/12/2012. No lumbar spine fractures. The discs are relatively well maintained in height. Endplate osteophytes are noted throughout the lumbar spine. There is no convincing disc herniation. Minor disc bulging is noted at L3-L4-L4-L5 and L5-S1. There are facet degenerative changes in the lower lumbar spine. Neural foraminal narrowing is noted moderate on the left at L4-L5 and mild on right. At L5-S1 is moderate to severe on the right and moderate on the left. Soft tissue evaluation shows  evidence of gallstones without acute cholecystitis. Left renal atrophy. Nonobstructing stone left kidney. Atherosclerotic changes along the aorta. Numerous diverticula noted along the colon mostly the left colon. No acute findings. IMPRESSION: 1. No acute abnormality. Specifically no evidence of acute fracture of the thoracolumbar spine. 2. Chronic mild compression fracture T12. 3. Chronic degenerative changes noted throughout the thoracolumbar spine. Bones are diffusely demineralized. Electronically Signed   By: Lajean Manes M.D.   On: 03/02/2015 16:35   Dg Hip Unilat With Pelvis 2-3 Views Left  03/02/2015  CLINICAL DATA:  Fall EXAM: DG HIP (WITH OR WITHOUT PELVIS) 2-3V LEFT COMPARISON:  05/26/2014 FINDINGS: Osteopenia. No acute fracture. No dislocation. Mild degenerative change of the hip joints and lower lumbar spine. IMPRESSION: No acute bony pathology.  Chronic changes. Electronically Signed   By: Marybelle Killings M.D.   On: 03/02/2015 13:59   I have personally reviewed and evaluated these images and lab results as part of my medical decision-making.   EKG Interpretation None      MDM   Final diagnoses:  Fall    81 yo M  With a chief complaints of left-sided pain. Patient headache ground level fall caused this. Patient with  exquisite tenderness all the way down his left side over the bony prominences of the ribs. He also is midline spinal tenderness worst about T1-T2.  Due to patient's profuse tenderness will obtain a CT head CT C-spine CT T and L-spine CT chest abdomen and pelvis. Patient also with pain with internal and external rotation of the left hip with tenderness about the left hemipelvis. Will obtain a pelvic film hip film tib-fib.   Imaging studies all negative. Patient feeling much better after Tylenol in a small dose of morphine.  Patient ready to go home he is hungry and would like to go home and eat something. c-collar cleared at bedside.  4:43 PM:  I have discussed the  diagnosis/risks/treatment options with the patient and family and believe the pt to be eligible for discharge home to follow-up with PCP. We also discussed returning to the ED immediately if new or worsening sx occur. We discussed the sx which are most concerning (e.g., sudden worsening pain, fever, inability to tolerate by mouth) that necessitate immediate return. Medications administered to the patient during their visit and any new prescriptions provided to the patient are listed below.  Medications given during this visit Medications  acetaminophen (TYLENOL) tablet 1,000 mg (1,000 mg Oral Given 03/02/15 1356)  morphine 2 MG/ML injection 2 mg (2 mg Intravenous Given 03/02/15 1356)  ondansetron (ZOFRAN) injection 4 mg (4 mg Intravenous Given 03/02/15 1356)  iohexol (OMNIPAQUE) 300 MG/ML solution 80 mL (80 mLs Intravenous Contrast Given 03/02/15 1516)    New Prescriptions   No medications on file    The patient appears reasonably screen and/or stabilized for discharge and I doubt any other medical condition or other [facility] requiring further screening, evaluation, or treatment in the ED at this time prior to discharge.    Deno Etienne, DO 03/02/15 [phone]

## 2015-03-05 ENCOUNTER — Encounter (HOSPITAL_COMMUNITY): Payer: Self-pay | Admitting: *Deleted

## 2015-03-05 ENCOUNTER — Inpatient Hospital Stay (HOSPITAL_COMMUNITY)
Admission: EM | Admit: 2015-03-05 | Discharge: 2015-03-07 | DRG: 690 | Disposition: A | Discharge status: Home / Self Care | Payer: Medicare Other | Attending: Internal Medicine | Admitting: Internal Medicine

## 2015-03-05 ENCOUNTER — Observation Stay (HOSPITAL_COMMUNITY): Payer: Medicare Other

## 2015-03-05 DIAGNOSIS — N183 Chronic kidney disease, stage 3 unspecified: Secondary | ICD-10-CM | POA: Diagnosis present

## 2015-03-05 DIAGNOSIS — Z79899 Other long term (current) drug therapy: Secondary | ICD-10-CM

## 2015-03-05 DIAGNOSIS — R531 Weakness: Secondary | ICD-10-CM

## 2015-03-05 DIAGNOSIS — N390 Urinary tract infection, site not specified: Secondary | ICD-10-CM | POA: Diagnosis present

## 2015-03-05 DIAGNOSIS — N1330 Unspecified hydronephrosis: Secondary | ICD-10-CM | POA: Diagnosis present

## 2015-03-05 DIAGNOSIS — N401 Enlarged prostate with lower urinary tract symptoms: Secondary | ICD-10-CM | POA: Diagnosis present

## 2015-03-05 DIAGNOSIS — Z923 Personal history of irradiation: Secondary | ICD-10-CM | POA: Diagnosis not present

## 2015-03-05 DIAGNOSIS — N400 Enlarged prostate without lower urinary tract symptoms: Secondary | ICD-10-CM | POA: Diagnosis present

## 2015-03-05 DIAGNOSIS — I129 Hypertensive chronic kidney disease with stage 1 through stage 4 chronic kidney disease, or unspecified chronic kidney disease: Secondary | ICD-10-CM | POA: Diagnosis present

## 2015-03-05 DIAGNOSIS — Z9841 Cataract extraction status, right eye: Secondary | ICD-10-CM

## 2015-03-05 DIAGNOSIS — D72829 Elevated white blood cell count, unspecified: Secondary | ICD-10-CM

## 2015-03-05 DIAGNOSIS — I10 Essential (primary) hypertension: Secondary | ICD-10-CM | POA: Diagnosis not present

## 2015-03-05 DIAGNOSIS — K257 Chronic gastric ulcer without hemorrhage or perforation: Secondary | ICD-10-CM

## 2015-03-05 DIAGNOSIS — Z87891 Personal history of nicotine dependence: Secondary | ICD-10-CM | POA: Diagnosis not present

## 2015-03-05 DIAGNOSIS — K259 Gastric ulcer, unspecified as acute or chronic, without hemorrhage or perforation: Secondary | ICD-10-CM | POA: Diagnosis present

## 2015-03-05 DIAGNOSIS — I252 Old myocardial infarction: Secondary | ICD-10-CM | POA: Diagnosis not present

## 2015-03-05 DIAGNOSIS — R109 Unspecified abdominal pain: Secondary | ICD-10-CM

## 2015-03-05 DIAGNOSIS — Z9842 Cataract extraction status, left eye: Secondary | ICD-10-CM

## 2015-03-05 DIAGNOSIS — I2510 Atherosclerotic heart disease of native coronary artery without angina pectoris: Secondary | ICD-10-CM | POA: Diagnosis present

## 2015-03-05 LAB — URINALYSIS, ROUTINE W REFLEX MICROSCOPIC
Bilirubin Urine: NEGATIVE
Glucose, UA: NEGATIVE mg/dL
KETONES UR: NEGATIVE mg/dL
NITRITE: NEGATIVE
PROTEIN: 100 mg/dL — AB
Specific Gravity, Urine: 1.011 (ref 1.005–1.030)
pH: 8 (ref 5.0–8.0)

## 2015-03-05 LAB — COMPREHENSIVE METABOLIC PANEL
ALBUMIN: 3.7 g/dL (ref 3.5–5.0)
ALT: 11 U/L — ABNORMAL LOW (ref 17–63)
ANION GAP: 7 (ref 5–15)
AST: 13 U/L — ABNORMAL LOW (ref 15–41)
Alkaline Phosphatase: 93 U/L (ref 38–126)
BUN: 22 mg/dL — ABNORMAL HIGH (ref 6–20)
CHLORIDE: 106 mmol/L (ref 101–111)
CO2: 26 mmol/L (ref 22–32)
Calcium: 9.1 mg/dL (ref 8.9–10.3)
Creatinine, Ser: 1.45 mg/dL — ABNORMAL HIGH (ref 0.61–1.24)
GFR calc Af Amer: 48 mL/min — ABNORMAL LOW (ref >60)
GFR calc non Af Amer: 42 mL/min — ABNORMAL LOW (ref >60)
Glucose, Bld: 122 mg/dL — ABNORMAL HIGH (ref 65–99)
POTASSIUM: 4.3 mmol/L (ref 3.5–5.1)
SODIUM: 139 mmol/L (ref 135–145)
TOTAL PROTEIN: 7.1 g/dL (ref 6.5–8.1)
Total Bilirubin: 1.2 mg/dL (ref 0.3–1.2)

## 2015-03-05 LAB — CBC WITH DIFFERENTIAL/PLATELET
BASOS PCT: 0 %
Basophils Absolute: 0 10*3/uL (ref 0.0–0.1)
EOS ABS: 0.1 10*3/uL (ref 0.0–0.7)
Eosinophils Relative: 1 %
HCT: 42.1 % (ref 39.0–52.0)
Hemoglobin: 13.9 g/dL (ref 13.0–17.0)
Lymphocytes Relative: 10 %
Lymphs Abs: 1.3 10*3/uL (ref 0.7–4.0)
MCH: 30.1 pg (ref 26.0–34.0)
MCHC: 33 g/dL (ref 30.0–36.0)
MCV: 91.1 fL (ref 78.0–100.0)
MONO ABS: 1.3 10*3/uL — AB (ref 0.1–1.0)
MONOS PCT: 10 %
NEUTROS PCT: 79 %
Neutro Abs: 9.8 10*3/uL — ABNORMAL HIGH (ref 1.7–7.7)
Platelets: 167 10*3/uL (ref 150–400)
RBC: 4.62 MIL/uL (ref 4.22–5.81)
RDW: 14 % (ref 11.5–15.5)
WBC: 12.6 10*3/uL — ABNORMAL HIGH (ref 4.0–10.5)

## 2015-03-05 LAB — URINE MICROSCOPIC-ADD ON: SQUAMOUS EPITHELIAL / LPF: NONE SEEN

## 2015-03-05 LAB — LIPASE, BLOOD: LIPASE: 27 U/L (ref 11–51)

## 2015-03-05 MED ORDER — FINASTERIDE 5 MG PO TABS
5.0000 mg | ORAL_TABLET | Freq: Every day | ORAL | Status: DC
  Administered 2015-03-05 – 2015-03-07 (×3): 5 mg via ORAL
  Filled 2015-03-05 (×3): qty 1

## 2015-03-05 MED ORDER — ONDANSETRON HCL 4 MG/2ML IJ SOLN: 4.0000 mg | Freq: Four times a day (QID) | INTRAMUSCULAR | Status: DC

## 2015-03-05 MED ORDER — CARVEDILOL 3.125 MG PO TABS
3.1250 mg | ORAL_TABLET | Freq: Every day | ORAL | Status: DC
  Administered 2015-03-05 – 2015-03-07 (×3): 3.125 mg via ORAL
  Filled 2015-03-05 (×3): qty 1

## 2015-03-05 MED ORDER — DEXTROSE 5 % IV SOLN: 1.0000 g | INTRAVENOUS | Status: DC

## 2015-03-05 MED ORDER — SPIRONOLACTONE 25 MG PO TABS
25.0000 mg | ORAL_TABLET | Freq: Every day | ORAL | Status: DC
  Administered 2015-03-05 – 2015-03-07 (×3): 25 mg via ORAL
  Filled 2015-03-05 (×3): qty 1

## 2015-03-05 MED ORDER — DEXTROSE 5 % IV SOLN
1.0000 g | INTRAVENOUS | Status: DC
  Administered 2015-03-05: 1 g via INTRAVENOUS
  Filled 2015-03-05: qty 10

## 2015-03-05 MED ORDER — ACETAMINOPHEN 500 MG PO TABS
500.0000 mg | ORAL_TABLET | ORAL | Status: DC | PRN
  Administered 2015-03-05 – 2015-03-07 (×4): 500 mg via ORAL
  Filled 2015-03-05 (×4): qty 1

## 2015-03-05 MED ORDER — MORPHINE SULFATE (PF) 4 MG/ML IV SOLN
4.0000 mg | Freq: Once | INTRAVENOUS | Status: AC
  Administered 2015-03-05: 4 mg via INTRAVENOUS
  Filled 2015-03-05: qty 1

## 2015-03-05 MED ORDER — SODIUM CHLORIDE 0.9 % IV SOLN
INTRAVENOUS | Status: DC
  Administered 2015-03-05: 14:00:00 via INTRAVENOUS

## 2015-03-05 MED ORDER — ZOLPIDEM TARTRATE 5 MG PO TABS
5.0000 mg | ORAL_TABLET | Freq: Once | ORAL | Status: AC
  Administered 2015-03-05: 5 mg via ORAL
  Filled 2015-03-05: qty 1

## 2015-03-05 MED ORDER — TAMSULOSIN HCL 0.4 MG PO CAPS
0.4000 mg | ORAL_CAPSULE | Freq: Every day | ORAL | Status: DC
  Administered 2015-03-05 – 2015-03-07 (×3): 0.4 mg via ORAL
  Filled 2015-03-05 (×3): qty 1

## 2015-03-05 MED ORDER — CEFTRIAXONE SODIUM 1 G IJ SOLR
1.0000 g | INTRAMUSCULAR | Status: DC
  Administered 2015-03-06 – 2015-03-07 (×2): 1 g via INTRAVENOUS
  Filled 2015-03-05 (×2): qty 10

## 2015-03-05 MED ORDER — PANTOPRAZOLE SODIUM 40 MG PO TBEC
40.0000 mg | DELAYED_RELEASE_TABLET | Freq: Every day | ORAL | Status: DC
  Administered 2015-03-06 – 2015-03-07 (×2): 40 mg via ORAL
  Filled 2015-03-05 (×2): qty 1

## 2015-03-05 MED ORDER — ONDANSETRON HCL 4 MG/2ML IJ SOLN
4.0000 mg | Freq: Four times a day (QID) | INTRAMUSCULAR | Status: DC | PRN
  Administered 2015-03-05: 4 mg via INTRAVENOUS
  Filled 2015-03-05: qty 2

## 2015-03-05 MED ORDER — BACITRACIN-NEOMYCIN-POLYMYXIN OINTMENT TUBE
1.0000 "application " | TOPICAL_OINTMENT | CUTANEOUS | Status: DC | PRN
  Filled 2015-03-05: qty 15

## 2015-03-05 NOTE — ED Notes (Signed)
Spoke with Jessica NS on 4 E. Start 57min at 1245. Adela Lank RN aware

## 2015-03-05 NOTE — ED Provider Notes (Addendum)
CSN: [id number]     Arrival date & time 03/05/15  0709 History   First MD Initiated Contact with Patient 03/05/15 [phone]     Chief Complaint  Patient presents with  . Flank Pain    left side    HPI Pt fell on Dec 18th.  He was sitting in a chair eating lunch and he slid off the chair landing on the left side. Since that fall he has been having pain in the left flank area.  He was evaluated in the ED and had numerous CT scans including scans of the chest abdomen and pelvis.  He was treated and released.  Pt states he has been having pain in the left flank still.  He has been taking tylenol without relief.  He has been hurting all night.  No fevers.  No vomiting.  He feels like he is able to urinate still in his adult diaper. Past Medical History  Diagnosis Date  . Arthritis   . Hypertension   . Myocardial infarction [facility]) 2014    mild"no symptoms" was told 2 months ago  . Pigmented skin lesions 07-27-12    generalized, never any skin cancer dx.  . Edema of extremities     lower extremities,feet; compression socks worned  . Kidney stones 07-27-12    hx. kidney stones, at present left ureteral stone  . Gastric ulcer 09/2012  . Skin cancer 10/27/12    poorly diff squamous cell carcinoma- left forehead  . CHF (congestive heart failure) ([place])   . Chronic kidney disease (CKD), stage III (moderate)   . 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 Hx of radiation therapy 01/15/13- 02/13/13    left temple 5000 cGy 20 sessions  . BPH (benign prostatic hyperplasia)   . Neuropathy [facility])    Past Surgical History  Procedure Laterality Date  . Cataract extraction, bilateral    . Wrist surgery      "nerve repair"  . Cystoscopy with retrograde pyelogram, ureteroscopy and stent placement Left 07/31/2012    Procedure: CYSTOSCOPY WITH LEFT RETROGRADE PYELOGRAM, LEFT URETEROSCOPY;  Surgeon: Claybon Jabs, MD;  Location: [facility];  Service: Urology;  Laterality: Left;  . Cystoscopy w/ ureteral  stent placement  02/2002    on left for ureteral stone  . Esophagogastroduodenoscopy N/A 09/14/2012    Procedure: ESOPHAGOGASTRODUODENOSCOPY (EGD);  Surgeon: Jerene Bears, MD;  Location: [facility];  Service: Gastroenterology;  Laterality: N/A;  . Appendectomy    . Esophagogastroduodenoscopy N/A 11/14/2012    Procedure: ESOPHAGOGASTRODUODENOSCOPY (EGD);  Surgeon: Jerene Bears, MD;  Location: [facility];  Service: Gastroenterology;  Laterality: N/A;  . Mohs surgery Left 11/28/12    forehead  . Skin biopsy Left 10/27/12    left forehead near frontal scalp  . Vasectomy     Family History  Problem Relation Age of Onset  . Colon cancer Neg Hx   . Heart attack Father   . Arthritis Mother    Social History  Substance Use Topics  . Smoking status: Former Smoker -- 0.50 packs/day for 22 years    Types: Cigarettes    Quit date: 07/27/1973  . Smokeless tobacco: Never Used  . Alcohol Use: No    Review of Systems  All other systems reviewed and are negative.     Allergies  Review of patient's allergies indicates no known allergies.  Home Medications   Prior to Admission medications   Medication Sig Start Date End Date  Taking? Authorizing Provider  acetaminophen (TYLENOL) 500 MG tablet Take 2 tablets (1,000 mg total) by mouth every 8 (eight) hours as needed for mild pain, fever or headache. Patient taking differently: Take 500 mg by mouth every 4 (four) hours as needed for mild pain, fever or headache.  05/28/14  Yes [name], MD  carvedilol (COREG) 3.125 MG tablet Take 3.125 mg by mouth daily.    Yes Historical Provider, MD  finasteride (PROSCAR) 5 MG tablet Take 5 mg by mouth daily.  11/02/12  Yes Historical Provider, MD  loperamide (IMODIUM) 2 MG capsule Take 2 mg by mouth as needed for diarrhea or loose stools. Do not exceed 8 doses in 24 hours   Yes Historical Provider, MD  neomycin-bacitracin-polymyxin (NEOSPORIN) [phone] ointment Apply 1 application topically as needed  (for minor skin tears or abrasions).    Yes Historical Provider, MD  nitroGLYCERIN (NITROSTAT) 0.4 MG SL tablet Place 0.4 mg under the tongue every 5 (five) minutes as needed for chest pain.   Yes Historical Provider, MD  pantoprazole (PROTONIX) 40 MG tablet Take 1 tablet (40 mg total) by mouth daily. Patient taking differently: Take 40 mg by mouth daily before breakfast.  05/28/14  Yes [name], MD  polyethylene glycol (MIRALAX / GLYCOLAX) packet Take 17 g by mouth daily. Patient taking differently: Take 17 g by mouth daily as needed (constipation).  05/28/14  Yes [name], MD  spironolactone (ALDACTONE) 25 MG tablet Take 25 mg by mouth daily.  04/15/14  Yes Historical Provider, MD  tamsulosin (FLOMAX) 0.4 MG CAPS Take 0.4 mg by mouth daily.    Yes Historical Provider, MD  Vitamin D, Ergocalciferol, (DRISDOL) 50000 UNITS CAPS capsule Take 50,000 Units by mouth every Wednesday.  02/13/15  Yes Historical Provider, MD  docusate sodium (COLACE) 100 MG capsule Take 1 capsule (100 mg total) by mouth 2 (two) times daily. Patient not taking: Reported on 03/02/2015 05/28/14   [name], MD  traMADol (ULTRAM) 50 MG tablet Take 1 tablet (50 mg total) by mouth every 6 (six) hours as needed for severe pain. Patient not taking: Reported on 03/02/2015 05/28/14   [name], MD   BP 133/65 mmHg  Pulse 68  Temp(Src) 97.9 F (36.6 C) (Oral)  Ht 5\' 7"  (1.702 m)  Wt 80.74 kg  BMI 27.87 kg/m2  SpO2 93% Physical Exam  Constitutional: No distress.  HENT:  Head: Normocephalic and atraumatic.  Right Ear: External ear normal.  Left Ear: External ear normal.  Eyes: Conjunctivae are normal. Right eye exhibits no discharge. Left eye exhibits no discharge. No scleral icterus.  Neck: Neck supple. No tracheal deviation present.  Cardiovascular: Normal rate, regular rhythm and intact distal pulses.   Pulmonary/Chest: Effort normal and breath sounds normal. No stridor. No respiratory distress. He has  no wheezes. He has no rales.  Abdominal: Soft. Bowel sounds are normal. He exhibits no distension. There is tenderness. There is no rebound and no guarding.  ? Bladder distended, fullness and ttp suprapubic, ttp left flank, no crepitus or bruising   Musculoskeletal: He exhibits no edema or tenderness.  Neurological: He is alert. He has normal strength. No cranial nerve deficit (no facial droop, extraocular movements intact, no slurred speech) or sensory deficit. He exhibits normal muscle tone. He displays no seizure activity. Coordination normal.  Skin: Skin is warm and dry. No rash noted. He is not diaphoretic.  Psychiatric: He has a normal mood and affect.  Nursing note and vitals reviewed.  ED Course  Procedures (including critical care time) Labs Review Labs Reviewed  CBC WITH DIFFERENTIAL/PLATELET - Abnormal; Notable for the following:    WBC 12.6 (*)    Neutro Abs 9.8 (*)    Monocytes Absolute 1.3 (*)    All other components within normal limits  COMPREHENSIVE METABOLIC PANEL - Abnormal; Notable for the following:    Glucose, Bld 122 (*)    BUN 22 (*)    Creatinine, Ser 1.45 (*)    AST 13 (*)    ALT 11 (*)    GFR calc non Af Amer 42 (*)    GFR calc Af Amer 48 (*)    All other components within normal limits  URINALYSIS, ROUTINE W REFLEX MICROSCOPIC (NOT AT [facility]) - Abnormal; Notable for the following:    APPearance TURBID (*)    Hgb urine dipstick LARGE (*)    Protein, ur 100 (*)    Leukocytes, UA LARGE (*)    All other components within normal limits  URINE MICROSCOPIC-ADD ON - Abnormal; Notable for the following:    Bacteria, UA MANY (*)    All other components within normal limits  URINE CULTURE  LIPASE, BLOOD   I have personally reviewed and evaluated these images and lab results as part of my medical decision-making.    MDM   Final diagnoses:  UTI (lower urinary tract infection)    Abdominal fullness in the suprapubic region.  Urinary retention?  Pt just  had numerous CT scans done a few days ago.  Doubt acute intestinal infection , obstruction or serious injury.  Will check labs and make sure he is not in urinary retention.'  Urine catheterization was performed.  Pt had less than 200 cc of urine.  No retention.  UA is consistent with a UTI, new since the 18th.  Suspect his pain is related to the infection as well as the recent fall.  Pt is afebrile.  Labs otherwise reassuring.    Pt would prefer to go home.  Will make sure he is able to get up and walk.  Given a dose of IV abx in the ED.  Plan on dc home with pain medications and abx rx.    Dorie Rank, MD 03/05/15 [phone]  Patient was given his medications. Patient tried to stand up but was unable to do so because of weakness and pain.  I will consult with medical service regarding admission for further treatment.  Imaging tests were performed on 12/18  Dorie Rank, MD 03/05/15 1109

## 2015-03-05 NOTE — H&P (Signed)
[facility] History and Physical  Eduardo Salazar [id number] DOB: 1925/05/11 DOA: 03/05/2015  Referring physician: ED physician, Dr. Tomi Bamberger PCP: [name], MD   Chief Complaint: weakness   HPI:  Pt is 81 yo male who recently presented to ED after an episode of fall on December 18th, 2016 when he slid off his chair. Since that time he has had left flank area pain. He was discharged home at that time but now return with same concern of persistent left flank pain, intermittent in nature, 7/10 in severity when present and sharp, occasionally radiating to lower abd quadrants. Pt reports some urinary urgency and frequency. Pt reports this has been associated with nausea but no fevers, chills, chest pain or shortness of breath. Pt denies similar events in the past, denies any specific alleviating or aggravating factors.   In ED, pt is hemodynamically stable, VSS, UA suggestive of UTI. [facility] asked to admit for further evaluation.   Assessment and Plan: Principal Problem:   UTI (lower urinary tract infection) - placed on empiric Rocephin - follow up on urine culture - due to left flank pain, requested renal US to rule out pyelo vs kidney stone   Active Problems:   Pyloric channel ulcer - continue Protonix per home medical regimen     CKD (chronic kidney disease) stage 3, GFR 30-59 ml/min - review of records indicated Cr in the past as high as 1.5 - this appears to be stable at this time - place on IVF - repeat BMP in AM    BPH (benign prostatic hyperplasia) - monitor urine output     Coronary artery disease involving native heart without angina pectoris - stable at this time    Benign essential HTN - stable on admission     Leukocytosis - secondary to UTI - CBC in AM  DVT prophylaxis - SCD's   Radiological Exams on Admission: No results found.   Code Status: Full Family Communication: Pt and daughter at bedside Disposition Plan: Admit for further evaluation     [facility] [id number]   Review of Systems:  Constitutional: Negative for diaphoresis.  HENT: Negative for hearing loss, ear pain, nosebleeds, congestion, sore throat, neck pain, tinnitus and ear discharge.   Eyes: Negative for blurred vision, double vision, photophobia, pain, discharge and redness.  Respiratory: Negative for cough, hemoptysis, sputum production, shortness of breath, wheezing and stridor.   Cardiovascular: Negative for chest pain, palpitations, orthopnea, claudication and leg swelling.  Gastrointestinal: Per HPI Genitourinary: Negative for dysuria or hematuria.   Musculoskeletal: Negative for myalgias, back pain..  Skin: Negative for itching and rash.  Neurological: Negative for dizziness and weakness. Endo/Heme/Allergies: Negative for environmental allergies and polydipsia. Does not bruise/bleed easily.  Psychiatric/Behavioral: Negative for suicidal ideas. The patient is not nervous/anxious.      Past Medical History  Diagnosis Date  . Myocardial infarction [facility]) 2014    mild"no symptoms" was told 2 months ago  . Hx of radiation therapy 01/15/13- 02/13/13    left temple 5000 cGy 20 sessions  . BPH (benign prostatic hyperplasia)     Past Surgical History  Procedure Laterality Date  . Cataract extraction, bilateral    . Wrist surgery      "nerve repair"  . Cystoscopy with retrograde pyelogram, ureteroscopy and stent placement Left 07/31/2012    Procedure: CYSTOSCOPY WITH LEFT RETROGRADE PYELOGRAM, LEFT URETEROSCOPY;  Surgeon: Claybon Jabs, MD;  [facility];  Service: Urology;  Laterality: Left;  . Cystoscopy w/  ureteral stent placement  02/2002    on left for ureteral stone  . Esophagogastroduodenoscopy N/A 09/14/2012    Procedure: ESOPHAGOGASTRODUODENOSCOPY (EGD);  Surgeon: Jerene Bears, MD;  Location: [facility];  Service: Gastroenterology;  Laterality: N/A;  . Appendectomy    . Esophagogastroduodenoscopy N/A 11/14/2012    Procedure:  ESOPHAGOGASTRODUODENOSCOPY (EGD);  Surgeon: Jerene Bears, MD;  Location: [facility];  Service: Gastroenterology;  Laterality: N/A;  . Mohs surgery Left 11/28/12    forehead  . Skin biopsy Left 10/27/12    left forehead near frontal scalp  . Vasectomy      Social History:  reports that he quit smoking about 41 years ago. His smoking use included Cigarettes. He has a 11 pack-year smoking history. He has never used smokeless tobacco. He reports that he does not drink alcohol or use illicit drugs.  No Known Allergies  Family History  Problem Relation Age of Onset  . Colon cancer Neg Hx   . Heart attack Father   . Arthritis Mother     Prior to Admission medications   Medication Sig Start Date End Date Taking? Authorizing Provider  acetaminophen (TYLENOL) 500 MG tablet Take 2 tablets (1,000 mg total) by mouth every 8 (eight) hours as needed for mild pain, fever or headache. Patient taking differently: Take 500 mg by mouth every 4 (four) hours as needed for mild pain, fever or headache.  05/28/14  Yes [name], MD  carvedilol (COREG) 3.125 MG tablet Take 3.125 mg by mouth daily.    Yes Historical Provider, MD  finasteride (PROSCAR) 5 MG tablet Take 5 mg by mouth daily.  11/02/12  Yes Historical Provider, MD  loperamide (IMODIUM) 2 MG capsule Take 2 mg by mouth as needed for diarrhea or loose stools. Do not exceed 8 doses in 24 hours   Yes Historical Provider, MD  neomycin-bacitracin-polymyxin (NEOSPORIN) [phone] ointment Apply 1 application topically as needed (for minor skin tears or abrasions).    Yes Historical Provider, MD  nitroGLYCERIN (NITROSTAT) 0.4 MG SL tablet Place 0.4 mg under the tongue every 5 (five) minutes as needed for chest pain.   Yes Historical Provider, MD  pantoprazole (PROTONIX) 40 MG tablet Take 1 tablet (40 mg total) by mouth daily. Patient taking differently: Take 40 mg by mouth daily before breakfast.  05/28/14  Yes [name], MD  polyethylene glycol  (MIRALAX / GLYCOLAX) packet Take 17 g by mouth daily. Patient taking differently: Take 17 g by mouth daily as needed (constipation).  05/28/14  Yes [name], MD  spironolactone (ALDACTONE) 25 MG tablet Take 25 mg by mouth daily.  04/15/14  Yes Historical Provider, MD  tamsulosin (FLOMAX) 0.4 MG CAPS Take 0.4 mg by mouth daily.    Yes Historical Provider, MD  Vitamin D, Ergocalciferol, (DRISDOL) 50000 UNITS CAPS capsule Take 50,000 Units by mouth every Wednesday.  02/13/15  Yes Historical Provider, MD  docusate sodium (COLACE) 100 MG capsule Take 1 capsule (100 mg total) by mouth 2 (two) times daily. Patient not taking: Reported on 03/02/2015 05/28/14   [name], MD  traMADol (ULTRAM) 50 MG tablet Take 1 tablet (50 mg total) by mouth every 6 (six) hours as needed for severe pain. Patient not taking: Reported on 03/02/2015 05/28/14   [name], MD    Physical Exam: Filed Vitals:   03/05/15 0800 03/05/15 0900 03/05/15 1000 03/05/15 1045  BP: 118/67 127/57 128/63 152/74  Pulse: 73 74 74 80  Temp:  TempSrc:      Height:      Weight:      SpO2: 96% 96% 95% 95%    Physical Exam  Constitutional: Appears well-developed and well-nourished. No distress.  HENT: Normocephalic. External right and left ear normal. Oropharynx is clear and moist.  Eyes: Conjunctivae and EOM are normal. PERRLA, no scleral icterus.  Neck: Normal ROM. Neck supple. No JVD. No tracheal deviation. No thyromegaly.  CVS: RRR, S1/S2 +, no gallops, no carotid bruit.  Pulmonary: Effort and breath sounds normal, no stridor, rhonchi, wheezes, rales.  Abdominal: Soft. BS +,  no distension, tenderness, rebound or guarding.  Musculoskeletal: No edema and no tenderness.  Lymphadenopathy: No lymphadenopathy noted, cervical, inguinal. Neuro: Alert. Normal reflexes, muscle tone coordination. No cranial nerve deficit. Skin: Skin is warm and dry. No rash noted. Not diaphoretic. No erythema. No pallor.  Psychiatric:  Normal mood and affect. Behavior, judgment, thought content normal.   Labs on Admission:  Basic Metabolic Panel:  Recent Labs Lab 03/02/15 1309 03/05/15 0747  NA 137 139  K 4.2 4.3  CL 107 106  CO2 23 26  GLUCOSE 103* 122*  BUN 13 22*  CREATININE 1.35* 1.45*  CALCIUM 9.0 9.1   Liver Function Tests:  Recent Labs Lab 03/02/15 1309 03/05/15 0747  AST 17 13*  ALT 12* 11*  ALKPHOS 86 93  BILITOT 0.5 1.2  PROT 5.7* 7.1  ALBUMIN 3.1* 3.7    Recent Labs Lab 03/02/15 1309 03/05/15 0747  LIPASE 39 27   CBC:  Recent Labs Lab 03/02/15 1309 03/05/15 0747  WBC 7.9 12.6*  NEUTROABS 5.1 9.8*  HGB 13.7 13.9  HCT 41.1 42.1  MCV 90.5 91.1  PLT 177 167   EKG: pending   If 7PM-7AM, please contact night-coverage [email or web address] Password [email or web address] 03/05/2015, 11:40 AM

## 2015-03-05 NOTE — ED Notes (Signed)
Pt reports pain in left flank x 5-6 days.  Pt recently was admitted for a fall and had a foley catheter in.  It is no longer in him at this time.

## 2015-03-05 NOTE — ED Notes (Signed)
Patient normally uses a walker to ambulate at the Assisted Living facility. Patient was able to stand with assistance, but unable to walk due to weakness and c/o back pain. Patient stated, "I can't do it."

## 2015-03-05 NOTE — ED Notes (Signed)
Bed: WA10 Expected date:  Expected time:  Means of arrival:  Comments: EMS 

## 2015-03-06 LAB — CBC
HEMATOCRIT: 36.2 % — AB (ref 39.0–52.0)
HEMOGLOBIN: 12.1 g/dL — AB (ref 13.0–17.0)
MCH: 30 pg (ref 26.0–34.0)
MCHC: 33.4 g/dL (ref 30.0–36.0)
MCV: 89.6 fL (ref 78.0–100.0)
Platelets: 157 10*3/uL (ref 150–400)
RBC: 4.04 MIL/uL — ABNORMAL LOW (ref 4.22–5.81)
RDW: 13.6 % (ref 11.5–15.5)
WBC: 10.5 10*3/uL (ref 4.0–10.5)

## 2015-03-06 LAB — BASIC METABOLIC PANEL
ANION GAP: 9 (ref 5–15)
BUN: 24 mg/dL — ABNORMAL HIGH (ref 6–20)
CALCIUM: 8.5 mg/dL — AB (ref 8.9–10.3)
CO2: 24 mmol/L (ref 22–32)
Chloride: 104 mmol/L (ref 101–111)
Creatinine, Ser: 1.44 mg/dL — ABNORMAL HIGH (ref 0.61–1.24)
GFR calc Af Amer: 49 mL/min — ABNORMAL LOW (ref >60)
GFR calc non Af Amer: 42 mL/min — ABNORMAL LOW (ref >60)
GLUCOSE: 109 mg/dL — AB (ref 65–99)
Potassium: 4 mmol/L (ref 3.5–5.1)
Sodium: 137 mmol/L (ref 135–145)

## 2015-03-06 LAB — TSH: TSH: 2.329 u[IU]/mL (ref 0.350–4.500)

## 2015-03-06 LAB — GLUCOSE, CAPILLARY: Glucose-Capillary: 104 mg/dL — ABNORMAL HIGH (ref 65–99)

## 2015-03-06 MED ORDER — CIPROFLOXACIN HCL 500 MG PO TABS: 500.0000 mg | ORAL_TABLET | Freq: Two times a day (BID) | ORAL | Status: DC

## 2015-03-06 NOTE — Clinical Social Work Note (Signed)
Clinical Social Work Assessment  Patient Details  Name: Eduardo Salazar MRN: [id number] Date of Birth: 09/11/1925  Date of referral:  03/06/15               Reason for consult:  Facility Placement, Discharge Planning                Permission sought to share information with:  Facility Contact Representative Permission granted to share information::  Yes, Verbal Permission Granted  Name::        Agency::     Relationship::     Contact Information:     Housing/Transportation Living arrangements for the past 2 months:  Assisted Living Facility Source of Information:  Facility, Adult Children Patient Interpreter Needed:  None Criminal Activity/Legal Involvement Pertinent to Current Situation/Hospitalization:  No - Comment as needed Significant Relationships:  Adult Children, Spouse Lives with:  Facility Resident Do you feel safe going back to the place where you live?  Yes Need for family participation in patient care:  Yes (Comment)  Care giving concerns:  No concerns reported by family.  Social Worker assessment / plan: Pt hospitalized on 03/05/15 with a UTI. Pt is from [facility].  CSW met with pt's daughter at bedside to assist with d/c planning. Pt was sleeping soundly during visit. MD plans to dc pt back to ALF today. [facility] contacted and reports that pt cannot return to ALF with foley in place. MD has been paged with update and a response is pending. Daughter  / pt's nsg has been updated. CSW will continue to follow to assist with d/c planning back to ALF. Employment status:  Retired Insurance information:  Medicare PT Recommendations:  Not assessed at this time Information / Referral to community resources:     Patient/Family's Response to care:  Family would like pt to return to ALF.  Patient/Family's Understanding of and Emotional Response to Diagnosis, Current Treatment, and Prognosis:  Daughter is aware of pt's medical status. Daughter reports that pt's  spouse also resides at [facility]. " It's nice to have them both there together."   Emotional Assessment Appearance:    Attitude/Demeanor/Rapport:  Unable to Assess Affect (typically observed):  Unable to Assess Orientation:  Oriented to Self, Oriented to Place, Oriented to Situation Alcohol / Substance use:  Not Applicable Psych involvement (Current and /or in the community):  No (Comment)  Discharge Needs  Concerns to be addressed:  Discharge Planning Concerns Readmission within the last 30 days:  No Current discharge risk:  None Barriers to Discharge:  No Barriers Identified   Haidinger, Jamie Lee, LCSW  [phone] 03/06/2015, 11:22 AM  

## 2015-03-06 NOTE — Care Management Note (Signed)
Case Management Note  Patient Details  Name: Eduardo Salazar MRN: [id number] Date of Birth: 1926-02-22  Subjective/Objective:81 y/o m admitted w/UTI. From ALF-[facility]. CM/CSW spoke patient's dtr Eduardo Salazar about d/c plans-discussed if dtr able to take patient home w/HHC since ALF unable to manage if d/c with f/c, dtr will explore going home w/HHC. PT-HHPT.Current d/c plan to explore HHC/SNF.MD agreed to iv abx,await urine cx,medical mgmnt, Dtr in agreement to current d/c plan.                   Action/Plan:d/c plan likely SNF.   Expected Discharge Date:   (UNKNOWN)               Expected Discharge Plan:  Skilled Nursing Facility  In-House Referral:  Clinical Social Work  Discharge planning Services  CM Consult  Post Acute Care Choice:    Choice offered to:     DME Arranged:    DME Agency:     HH Arranged:    [place] Agency:     Status of Service:  In process, will continue to follow  Medicare Important Message Given:    Date Medicare IM Given:    Medicare IM give by:    Date Additional Medicare IM Given:    Additional Medicare Important Message give by:     If discussed at [place] of Stay Meetings, dates discussed:    Additional Comments:  Dessa Phi, RN 03/06/2015, 12:45 PM

## 2015-03-06 NOTE — [place] FL2 (Signed)
[facility] [facility] MEDICAID FL2 LEVEL OF CARE SCREENING TOOL     IDENTIFICATION  Patient Name: Eduardo Salazar Birthdate: [birth year] Sex: male Admission Date (Current Location): 03/05/2015  [facility] and [place] Number:  Herbalist and Address:  [facility],  [place] [address] [place]      Provider Number: [id number]  Attending Physician Name and Address:  [name], MD  Relative Name and Phone Number:       Current Level of Care: Hospital Recommended Level of Care: [place] Prior Approval Number:    Date Approved/Denied:   PASRR Number:    Discharge Plan: Other (Comment) ([place] )    Current Diagnoses: Patient Active Problem List   Diagnosis Date Noted  . UTI (lower urinary tract infection) 03/05/2015  . Coronary artery disease involving native heart without angina pectoris 03/05/2015  . Benign essential HTN 03/05/2015  . Leukocytosis 03/05/2015  . T12 compression fracture ([place]) 05/26/2014  . BPH (benign prostatic hyperplasia) 05/26/2014  . Squamous cell carcinoma of skin of other and unspecified parts of face 10/27/2012  . CKD (chronic kidney disease) stage 3, GFR 30-59 ml/min 09/13/2012  . Pyloric channel ulcer 09/12/2012    Orientation RESPIRATION BLADDER Height & Weight    Self, Situation, Place  Normal Continent      BEHAVIORAL SYMPTOMS/MOOD NEUROLOGICAL BOWEL NUTRITION STATUS  Other (Comment) (No Behaviors)   Continent Diet (Regular)  AMBULATORY STATUS COMMUNICATION OF NEEDS Skin   Limited Assist Verbally Other (Comment) (stage 2 decub ulcer on sacrum)                       Personal Care Assistance Level of Assistance  Bathing, Feeding, Dressing Bathing Assistance: Limited assistance Feeding assistance: Independent Dressing Assistance: Limited assistance     Functional Limitations Info  Sight, Hearing, Speech Sight Info: Adequate Hearing Info: Adequate Speech Info: Adequate     SPECIAL CARE FACTORS FREQUENCY                       Contractures Contractures Info: Not present    Additional Factors Info  Code Status Code Status Info: Full Code             Current Medications (03/06/2015):  This is the current hospital active medication list Current Facility-Administered Medications  Medication Dose Route Frequency Provider Last Rate Last Dose  . acetaminophen (TYLENOL) tablet 500 mg  500 mg Oral Q4H PRN [name], MD   500 mg at 03/05/15 2201  . carvedilol (COREG) tablet 3.125 mg  3.125 mg Oral Daily [name], MD   3.125 mg at 03/06/15 0946  . cefTRIAXone (ROCEPHIN) 1 g in dextrose 5 % 50 mL IVPB  1 g Intravenous Q24H [name], MD   1 g at 03/06/15 0945  . finasteride (PROSCAR) tablet 5 mg  5 mg Oral Daily [name], MD   5 mg at 03/06/15 0945  . neomycin-bacitracin-polymyxin (NEOSPORIN) ointment 1 application  1 application Topical PRN [name], MD      . ondansetron [facility]) injection 4 mg  4 mg Intravenous Q6H PRN [name], MD   4 mg at 03/05/15 1412  . pantoprazole (PROTONIX) EC tablet 40 mg  40 mg Oral QAC breakfast [name], MD   40 mg at 03/06/15 [phone]  . spironolactone (ALDACTONE) tablet 25 mg  25 mg Oral Daily [name],  MD   25 mg at 03/06/15 0945  . tamsulosin (FLOMAX) capsule 0.4 mg  0.4 mg Oral Daily [name], MD   0.4 mg at 03/06/15 [id number]     Discharge Medications:   Medication List    STOP taking these medications       docusate sodium 100 MG capsule  Commonly known as: COLACE     traMADol 50 MG tablet  Commonly known as: ULTRAM      TAKE these medications       acetaminophen 500 MG tablet  Commonly known as: TYLENOL  Take 2 tablets (1,000 mg total) by mouth every 8 (eight) hours as needed for mild pain, fever or headache.     carvedilol 3.125 MG tablet  Commonly known as: COREG  Take 3.125 mg by mouth daily.     ciprofloxacin 500 MG tablet   Commonly known as: CIPRO  Take 1 tablet (500 mg total) by mouth 2 (two) times daily.     finasteride 5 MG tablet  Commonly known as: PROSCAR  Take 5 mg by mouth daily.     loperamide 2 MG capsule  Commonly known as: IMODIUM  Take 2 mg by mouth as needed for diarrhea or loose stools. Do not exceed 8 doses in 24 hours     neomycin-bacitracin-polymyxin [phone] ointment  Apply 1 application topically as needed (for minor skin tears or abrasions).     nitroGLYCERIN 0.4 MG SL tablet  Commonly known as: NITROSTAT  Place 0.4 mg under the tongue every 5 (five) minutes as needed for chest pain.     pantoprazole 40 MG tablet  Commonly known as: PROTONIX  Take 1 tablet (40 mg total) by mouth daily.     polyethylene glycol packet  Commonly known as: MIRALAX / GLYCOLAX  Take 17 g by mouth daily.     spironolactone 25 MG tablet  Commonly known as: ALDACTONE  Take 25 mg by mouth daily.     tamsulosin 0.4 MG Caps capsule  Commonly known as: FLOMAX  Take 0.4 mg by mouth daily.     Vitamin D (Ergocalciferol) 50000 UNITS Caps capsule  Commonly known as: DRISDOL  Take 50,000 Units by mouth every Wednesday.                      Please see discharge summary for a list of discharge medications.  Relevant Imaging Results:  Relevant Lab Results:   Additional Information SS # [id number]  Deanglo Hissong, Randall An, LCSW

## 2015-03-06 NOTE — Discharge Instructions (Signed)
Urinary Tract Infection Urinary tract infections (UTIs) can develop anywhere along your urinary tract. Your urinary tract is your body's drainage system for removing wastes and extra water. Your urinary tract includes two kidneys, two ureters, a bladder, and a urethra. Your kidneys are a pair of bean-shaped organs. Each kidney is about the size of your fist. They are located below your ribs, one on each side of your spine. CAUSES Infections are caused by microbes, which are microscopic organisms, including fungi, viruses, and bacteria. These organisms are so small that they can only be seen through a microscope. Bacteria are the microbes that most commonly cause UTIs. SYMPTOMS  Symptoms of UTIs may vary by age and gender of the patient and by the location of the infection. Symptoms in young women typically include a frequent and intense urge to urinate and a painful, burning feeling in the bladder or urethra during urination. Older women and men are more likely to be tired, shaky, and weak and have muscle aches and abdominal pain. A fever may mean the infection is in your kidneys. Other symptoms of a kidney infection include pain in your back or sides below the ribs, nausea, and vomiting. DIAGNOSIS To diagnose a UTI, your caregiver will ask you about your symptoms. Your caregiver will also ask you to provide a urine sample. The urine sample will be tested for bacteria and white blood cells. White blood cells are made by your body to help fight infection. TREATMENT  Typically, UTIs can be treated with medication. Because most UTIs are caused by a bacterial infection, they usually can be treated with the use of antibiotics. The choice of antibiotic and length of treatment depend on your symptoms and the type of bacteria causing your infection. HOME CARE INSTRUCTIONS  If you were prescribed antibiotics, take them exactly as your caregiver instructs you. Finish the medication even if you feel better after  you have only taken some of the medication.  Drink enough water and fluids to keep your urine clear or pale yellow.  Avoid caffeine, tea, and carbonated beverages. They tend to irritate your bladder.  Empty your bladder often. Avoid holding urine for long periods of time.  Empty your bladder before and after sexual intercourse.  After a bowel movement, women should cleanse from front to back. Use each tissue only once. SEEK MEDICAL CARE IF:   You have back pain.  You develop a fever.  Your symptoms do not begin to resolve within 3 days. SEEK IMMEDIATE MEDICAL CARE IF:   You have severe back pain or lower abdominal pain.  You develop chills.  You have nausea or vomiting.  You have continued burning or discomfort with urination. MAKE SURE YOU:   Understand these instructions.  Will watch your condition.  Will get help right away if you are not doing well or get worse.   This information is not intended to replace advice given to you by your health care provider. Make sure you discuss any questions you have with your health care provider.   Document Released: 12/09/2004 Document Revised: 11/20/2014 Document Reviewed: 04/09/2011 Elsevier Interactive Patient Education 2016 [employer]. Hydronephrosis Hydronephrosis is the enlargement of a kidney due to a blockage that stops urine from flowing out of the body. CAUSES Common causes of this condition include:  A birth (congenital) defect of the kidney.  A congenital defect of the tube through which urine travels (ureter).  Kidney stones.  An enlarged prostate gland.  A tumor.  Cancer  of the prostate, bladder, uterus, ovary, or colon.  A blood clot. SYMPTOMS Symptoms of this condition include:  Pain or discomfort in your side (flank).  Swelling of the abdomen.  Pain in the abdomen.  Nausea and vomiting.  Fever.  Pain while passing urine.  Feeling of urgency to urinate.  Frequent  urination.  Infection of the urinary tract. In some cases, there are no symptoms. DIAGNOSIS This condition may be diagnosed with:  A medical history.  A physical exam.  Blood and urine tests to check kidney function.  Imaging tests, such as an X-ray, ultrasound, CT scan, or MRI.  A test in which a rigid or flexible telescope (cystoscope) is used to view the site of the blockage. TREATMENT Treatment for this condition depends on where the blockage is located, how long it has been there, and what caused it. The goal of treatment is to remove the blockage. Treatment options include:  A procedure to put in a soft tube to help drain urine.  Antibiotic medicines to treat or prevent infection.  Shock-wave therapy (lithotripsy) to help eliminate kidney stones. HOME CARE INSTRUCTIONS  Get lots of rest.  Drink enough fluid to keep your urine clear or pale yellow.  If you have a drain in, follow your health care provider's instructions about how to care for it.  Take medicines only as directed by your health care provider.  If you were prescribed an antibiotic medicine, finish all of it even if you start to feel better.  Keep all follow-up visits as directed by your health care provider. This is important. SEEK MEDICAL CARE IF:  You continue to have symptoms after treatment.  You develop new symptoms.  You have a problem with a drainage device.  Your urine becomes cloudy or bloody.  You have a fever. SEEK IMMEDIATE MEDICAL CARE IF:  You have severe flank or abdominal pain.  You develop vomiting and are unable to keep fluids down.   This information is not intended to replace advice given to you by your health care provider. Make sure you discuss any questions you have with your health care provider.   Document Released: 12/27/2006 Document Revised: 07/16/2014 Document Reviewed: 02/25/2014 Elsevier Interactive Patient Education [employer].

## 2015-03-06 NOTE — Discharge Summary (Signed)
Physician Discharge Summary  Eduardo Salazar [id number] DOB: November 24, 1925 DOA: 03/05/2015  PCP: [name], MD  Admit date: 03/05/2015 Discharge date: 03/06/2015  Recommendations for Outpatient Follow-up:  1. Pt will need to follow up with PCP in 1-2 weeks post discharge 2. Please obtain BMP to evaluate electrolytes and kidney function 3. Please note that pt should continue Cipro for 7 days post discharge 4. Please note that urine culture is pending upon discharge and needed to be followed up on  5. Please also note that pt has an appointment with urology on December 29th, 2016 at 10 am for voiding trial  6. Pt to have foley in place until he is seen by urology team   Discharge Diagnoses:  Principal Problem:   UTI (lower urinary tract infection) Active Problems:   Pyloric channel ulcer   CKD (chronic kidney disease) stage 3, GFR 30-59 ml/min   BPH (benign prostatic hyperplasia)   Coronary artery disease involving native heart without angina pectoris   Benign essential HTN   Leukocytosis   Discharge Condition: Stable  Diet recommendation: Heart healthy diet discussed in details    HPI:  Pt is 81 yo male who recently presented to ED after an episode of fall on December 18th, 2016 when he slid off his chair. Since that time he has had left flank area pain. He was discharged home at that time but now return with same concern of persistent left flank pain, intermittent in nature, 7/10 in severity when present and sharp, occasionally radiating to lower abd quadrants. Pt reports some urinary urgency and frequency. Pt reports this has been associated with nausea but no fevers, chills, chest pain or shortness of breath. Pt denies similar events in the past, denies any specific alleviating or aggravating factors.   In ED, pt is hemodynamically stable, VSS, UA suggestive of UTI. [facility] asked to admit for further evaluation.   Assessment and Plan: Principal Problem:  UTI (lower urinary  tract infection) - placed on empiric Rocephin and since pt wants to be discharged today, will change to oral Cipro upon discharge to complete therapy for 7 days post discharge - follow up on urine culture  Active Problems:   Bilateral mild hydronephrosis - discussed with urologist on call and recommended placing foley cath - can be d/c with foley with scheduled appointment with urology 03/13/15 for voiding trial and to make sure hydro resolved    Pyloric channel ulcer - continue Protonix per home medical regimen    CKD (chronic kidney disease) stage 3, GFR 30-59 ml/min - review of records indicated Cr in the past as high as 1.5 - this appears to be stable at this time   BPH (benign prostatic hyperplasia) - foley in place    Coronary artery disease involving native heart without angina pectoris - stable at this time   Benign essential HTN - stable on admission    Leukocytosis - secondary to UTI - resolved, continue ABX as noted above   Code Status: Full Family Communication: Pt and daughter at bedside Disposition Plan: D/C to ALF       Procedures/Studies: Dg Chest 1 View  03/02/2015  CLINICAL DATA:  Fall EXAM: CHEST 1 VIEW COMPARISON:  05/26/2004 FINDINGS: Normal heart size. Clear lungs. No pneumothorax or pleural effusion. IMPRESSION: No active disease. Electronically Signed   By: Marybelle Killings M.D.   On: 03/02/2015 13:58   Dg Tibia/fibula Left  03/02/2015  CLINICAL DATA:  Fall EXAM: LEFT TIBIA AND FIBULA -  2 VIEW COMPARISON:  None. FINDINGS: No acute fracture.  No dislocation. IMPRESSION: No acute bony pathology. Electronically Signed   By: Marybelle Killings M.D.   On: 03/02/2015 14:01   Ct Head Wo Contrast  03/02/2015  CLINICAL DATA:  Fall from standing position with head injury. No loss of consciousness. Neck pain. EXAM: CT HEAD WITHOUT CONTRAST CT CERVICAL SPINE WITHOUT CONTRAST TECHNIQUE: Multidetector CT imaging of the head and cervical spine was performed  following the standard protocol without intravenous contrast. Multiplanar CT image reconstructions of the cervical spine were also generated. COMPARISON:  Head CT 07/16/2013.  Cervical spine CT 11/20/2006. FINDINGS: CT HEAD FINDINGS There is no evidence of acute intracranial hemorrhage, mass lesion, brain edema or extra-axial fluid collection. The ventricles and subarachnoid spaces are diffusely prominent but stable. There is no CT evidence of acute cortical infarction. There is stable chronic periventricular and subcortical white matter disease. Intracranial vascular calcifications are noted. The visualized paranasal sinuses, mastoid air cells and middle ears are clear. The calvarium is intact. CT CERVICAL SPINE FINDINGS The alignment is normal. There is no evidence of acute fracture or paraspinal abnormality. The disc spaces are relatively preserved. There are stable anterior osteophytes and mild uncinate spurring throughout the cervical spine. Ossification of the ligamentum nuchae is noted. No acute soft tissue findings are seen. Carotid arterial calcifications are present bilaterally. There are postsurgical changes at both lung apices. IMPRESSION: 1. No acute intracranial or calvarial findings. Stable atrophy and chronic small vessel ischemic changes. 2. No evidence of acute cervical spine fracture, traumatic subluxation or static signs instability. Electronically Signed   By: Richardean Sale M.D.   On: 03/02/2015 16:12   Ct Chest W Contrast  03/02/2015  CLINICAL DATA:  Fall. EXAM: CT CHEST, ABDOMEN, AND PELVIS WITH CONTRAST TECHNIQUE: Multidetector CT imaging of the chest, abdomen and pelvis was performed following the standard protocol during bolus administration of intravenous contrast. CONTRAST:  65mL OMNIPAQUE IOHEXOL 300 MG/ML  SOLN COMPARISON:  09/12/2012 FINDINGS: CT CHEST FINDINGS Mediastinum/Nodes: The heart size appears normal. No pericardial effusion identified. Aortic atherosclerosis noted.  Calcification within the LAD coronary artery noted. The trachea is patent and is midline. Normal appearance of the esophagus. Lungs/Pleura: No pleural fluid identified. No evidence for pulmonary contusion or pneumothorax. Musculoskeletal: There is degenerative disc disease noted within the thoracic spine. No aggressive lytic or sclerotic bone lesions identified. CT ABDOMEN PELVIS FINDINGS Hepatobiliary: There is no suspicious liver abnormality. Stones are noted within the dependent portion of the gallbladder. No gallbladder wall thickening or pericholecystic fluid. No biliary dilatation. Pancreas: Negative. Spleen: No focal splenic abnormality. Adrenals/Urinary Tract: The adrenal glands are negative. Normal appearance of the right kidney. Left renal atrophy identified. Nonobstructing calculus within the posterior aspect of the right kidney measures 4 mm. Interval improvement in previously noted left-sided hydronephrosis and hydroureter. Stomach/Bowel: The stomach is normal. The small bowel loops have a normal course and caliber. No bowel obstruction. No pathologic dilatation of the large bowel loops. Numerous distal colonic diverticula noted. No acute inflammation. Vascular/Lymphatic: Calcified atherosclerotic disease involves the abdominal aorta. No aneurysm. No enlarged retroperitoneal or mesenteric adenopathy. No enlarged pelvic or inguinal lymph nodes. Reproductive: The prostate gland and seminal vesicles are within normal limits. Other: No free fluid or fluid collections within the abdomen or pelvis. Musculoskeletal:Stable chronic compression fracture involve T12 vertebra. Spondylosis noted within the lumbar spine. No aggressive lytic or sclerotic bone lesions. IMPRESSION: 1. No acute findings identified within the chest, abdomen or pelvis. 2. Aortic  atherosclerosis and coronary artery calcification noted. 3. Chronic left renal atrophy. There has been interval improvement in previously noted left-sided  hydronephrosis and hydroureter. Electronically Signed   By: Kerby Moors M.D.   On: 03/02/2015 16:17   Ct Cervical Spine Wo Contrast  03/02/2015  CLINICAL DATA:  Fall from standing position with head injury. No loss of consciousness. Neck pain. EXAM: CT HEAD WITHOUT CONTRAST CT CERVICAL SPINE WITHOUT CONTRAST TECHNIQUE: Multidetector CT imaging of the head and cervical spine was performed following the standard protocol without intravenous contrast. Multiplanar CT image reconstructions of the cervical spine were also generated. COMPARISON:  Head CT 07/16/2013.  Cervical spine CT 11/20/2006. FINDINGS: CT HEAD FINDINGS There is no evidence of acute intracranial hemorrhage, mass lesion, brain edema or extra-axial fluid collection. The ventricles and subarachnoid spaces are diffusely prominent but stable. There is no CT evidence of acute cortical infarction. There is stable chronic periventricular and subcortical white matter disease. Intracranial vascular calcifications are noted. The visualized paranasal sinuses, mastoid air cells and middle ears are clear. The calvarium is intact. CT CERVICAL SPINE FINDINGS The alignment is normal. There is no evidence of acute fracture or paraspinal abnormality. The disc spaces are relatively preserved. There are stable anterior osteophytes and mild uncinate spurring throughout the cervical spine. Ossification of the ligamentum nuchae is noted. No acute soft tissue findings are seen. Carotid arterial calcifications are present bilaterally. There are postsurgical changes at both lung apices. IMPRESSION: 1. No acute intracranial or calvarial findings. Stable atrophy and chronic small vessel ischemic changes. 2. No evidence of acute cervical spine fracture, traumatic subluxation or static signs instability. Electronically Signed   By: Richardean Sale M.D.   On: 03/02/2015 16:12   Ct Abdomen Pelvis W Contrast  03/02/2015  CLINICAL DATA:  Fall. EXAM: CT CHEST, ABDOMEN, AND  PELVIS WITH CONTRAST TECHNIQUE: Multidetector CT imaging of the chest, abdomen and pelvis was performed following the standard protocol during bolus administration of intravenous contrast. CONTRAST:  89mL OMNIPAQUE IOHEXOL 300 MG/ML  SOLN COMPARISON:  09/12/2012 FINDINGS: CT CHEST FINDINGS Mediastinum/Nodes: The heart size appears normal. No pericardial effusion identified. Aortic atherosclerosis noted. Calcification within the LAD coronary artery noted. The trachea is patent and is midline. Normal appearance of the esophagus. Lungs/Pleura: No pleural fluid identified. No evidence for pulmonary contusion or pneumothorax. Musculoskeletal: There is degenerative disc disease noted within the thoracic spine. No aggressive lytic or sclerotic bone lesions identified. CT ABDOMEN PELVIS FINDINGS Hepatobiliary: There is no suspicious liver abnormality. Stones are noted within the dependent portion of the gallbladder. No gallbladder wall thickening or pericholecystic fluid. No biliary dilatation. Pancreas: Negative. Spleen: No focal splenic abnormality. Adrenals/Urinary Tract: The adrenal glands are negative. Normal appearance of the right kidney. Left renal atrophy identified. Nonobstructing calculus within the posterior aspect of the right kidney measures 4 mm. Interval improvement in previously noted left-sided hydronephrosis and hydroureter. Stomach/Bowel: The stomach is normal. The small bowel loops have a normal course and caliber. No bowel obstruction. No pathologic dilatation of the large bowel loops. Numerous distal colonic diverticula noted. No acute inflammation. Vascular/Lymphatic: Calcified atherosclerotic disease involves the abdominal aorta. No aneurysm. No enlarged retroperitoneal or mesenteric adenopathy. No enlarged pelvic or inguinal lymph nodes. Reproductive: The prostate gland and seminal vesicles are within normal limits. Other: No free fluid or fluid collections within the abdomen or pelvis.  Musculoskeletal:Stable chronic compression fracture involve T12 vertebra. Spondylosis noted within the lumbar spine. No aggressive lytic or sclerotic bone lesions. IMPRESSION: 1. No acute findings  identified within the chest, abdomen or pelvis. 2. Aortic atherosclerosis and coronary artery calcification noted. 3. Chronic left renal atrophy. There has been interval improvement in previously noted left-sided hydronephrosis and hydroureter. Electronically Signed   By: Kerby Moors M.D.   On: 03/02/2015 16:17   US Renal  03/05/2015  CLINICAL DATA:  81 year old male with left flank pain. EXAM: RENAL / URINARY TRACT ULTRASOUND COMPLETE COMPARISON:  Abdominal CT dated 06/28/2014 FINDINGS: Right Kidney: Length: 10.3 cm. There is mild right renal parenchymal atrophy. There is mild right hydronephrosis. No echogenic stone identified. Left Kidney: Length: 11.7 cm. The left kidney is atrophic. There is mild left hydronephrosis. No echogenic stone identified. Bladder: Low-level echogenic debris noted layering along the posterior wall of the bladder. Correlation with urinalysis recommended. IMPRESSION: Atrophic kidneys with mild bilateral hydronephrosis. No echogenic stone identified. Correlation with urinalysis recommended to exclude UTI. Electronically Signed   By: Anner Crete M.D.   On: 03/05/2015 19:18   Ct T-spine No Charge  03/02/2015  CLINICAL DATA:  Patient fell while sitting down to the table today. Patient struck his head. No loss of consciousness. Complaining of thoracic back pain. EXAM: CT THORACIC, AND LUMBAR SPINE WITHOUT CONTRAST TECHNIQUE: Multidetector CT imaging of the cervical, thoracic and lumbar spine was performed without intravenous contrast. Multiplanar CT image reconstructions were also generated. COMPARISON:  CT, 09/12/2012. FINDINGS: CT THORACIC SPINE FINDINGS There is no acute fracture. No spondylolisthesis. There is a Schmorl's node with mild depression of the central anterior upper  endplate at 624THL, stable from prior CT. All remaining thoracic vertebrae are normal in height. There is mild loss of disc height along the thoracic spine with anterior and lateral endplate osteophytes. The bones are demineralized. Central spinal canal is relatively well preserved. No evidence of significant disc bulging or disc herniation. Neural foramina are relatively well preserved. There is sub cm thyroid nodule on the left. No mediastinal or hilar masses or adenopathy. Moderate coronary artery calcifications are noted. Lungs show areas of peripheral scarring and mild lower lobe bronchiectasis. There is significant left renal atrophy. No acute soft tissue abnormality. CT LUMBAR SPINE FINDINGS No acute fracture or spondylolisthesis. There is depression of the central and anterior upper endplate of 624THL which is stable from the study dated 09/12/2012. No lumbar spine fractures. The discs are relatively well maintained in height. Endplate osteophytes are noted throughout the lumbar spine. There is no convincing disc herniation. Minor disc bulging is noted at L3-L4-L4-L5 and L5-S1. There are facet degenerative changes in the lower lumbar spine. Neural foraminal narrowing is noted moderate on the left at L4-L5 and mild on right. At L5-S1 is moderate to severe on the right and moderate on the left. Soft tissue evaluation shows evidence of gallstones without acute cholecystitis. Left renal atrophy. Nonobstructing stone left kidney. Atherosclerotic changes along the aorta. Numerous diverticula noted along the colon mostly the left colon. No acute findings. IMPRESSION: 1. No acute abnormality. Specifically no evidence of acute fracture of the thoracolumbar spine. 2. Chronic mild compression fracture T12. 3. Chronic degenerative changes noted throughout the thoracolumbar spine. Bones are diffusely demineralized. Electronically Signed   By: Lajean Manes M.D.   On: 03/02/2015 16:35   Ct L-spine No Charge  03/02/2015   CLINICAL DATA:  Patient fell while sitting down to the table today. Patient struck his head. No loss of consciousness. Complaining of thoracic back pain. EXAM: CT THORACIC, AND LUMBAR SPINE WITHOUT CONTRAST TECHNIQUE: Multidetector CT imaging of the cervical, thoracic and  lumbar spine was performed without intravenous contrast. Multiplanar CT image reconstructions were also generated. COMPARISON:  CT, 09/12/2012. FINDINGS: CT THORACIC SPINE FINDINGS There is no acute fracture. No spondylolisthesis. There is a Schmorl's node with mild depression of the central anterior upper endplate at 624THL, stable from prior CT. All remaining thoracic vertebrae are normal in height. There is mild loss of disc height along the thoracic spine with anterior and lateral endplate osteophytes. The bones are demineralized. Central spinal canal is relatively well preserved. No evidence of significant disc bulging or disc herniation. Neural foramina are relatively well preserved. There is sub cm thyroid nodule on the left. No mediastinal or hilar masses or adenopathy. Moderate coronary artery calcifications are noted. Lungs show areas of peripheral scarring and mild lower lobe bronchiectasis. There is significant left renal atrophy. No acute soft tissue abnormality. CT LUMBAR SPINE FINDINGS No acute fracture or spondylolisthesis. There is depression of the central and anterior upper endplate of 624THL which is stable from the study dated 09/12/2012. No lumbar spine fractures. The discs are relatively well maintained in height. Endplate osteophytes are noted throughout the lumbar spine. There is no convincing disc herniation. Minor disc bulging is noted at L3-L4-L4-L5 and L5-S1. There are facet degenerative changes in the lower lumbar spine. Neural foraminal narrowing is noted moderate on the left at L4-L5 and mild on right. At L5-S1 is moderate to severe on the right and moderate on the left. Soft tissue evaluation shows evidence of gallstones  without acute cholecystitis. Left renal atrophy. Nonobstructing stone left kidney. Atherosclerotic changes along the aorta. Numerous diverticula noted along the colon mostly the left colon. No acute findings. IMPRESSION: 1. No acute abnormality. Specifically no evidence of acute fracture of the thoracolumbar spine. 2. Chronic mild compression fracture T12. 3. Chronic degenerative changes noted throughout the thoracolumbar spine. Bones are diffusely demineralized. Electronically Signed   By: Lajean Manes M.D.   On: 03/02/2015 16:35   Dg Hip Unilat With Pelvis 2-3 Views Left  03/02/2015  CLINICAL DATA:  Fall EXAM: DG HIP (WITH OR WITHOUT PELVIS) 2-3V LEFT COMPARISON:  05/26/2014 FINDINGS: Osteopenia. No acute fracture. No dislocation. Mild degenerative change of the hip joints and lower lumbar spine. IMPRESSION: No acute bony pathology.  Chronic changes. Electronically Signed   By: Marybelle Killings M.D.   On: 03/02/2015 13:59    Discharge Exam: Filed Vitals:   03/05/15 2044 03/06/15 0403  BP: 123/55 125/62  Pulse: 72 73  Temp: 97.7 F (36.5 C) 97.8 F (36.6 C)  Resp: 18 18   Filed Vitals:   03/05/15 1319 03/05/15 1357 03/05/15 2044 03/06/15 0403  BP:  130/76 123/55 125/62  Pulse:  83 72 73  Temp:  98.2 F (36.8 C) 97.7 F (36.5 C) 97.8 F (36.6 C)  TempSrc:  Oral Oral Oral  Resp:  18 18 18   Height:  5\' 7"  (1.702 m)    Weight:  80.5 kg (177 lb 7.5 oz)  82.1 kg (181 lb)  SpO2: 95% 97% 96% 95%    General: Pt is alert, follows commands appropriately, not in acute distress Cardiovascular: Regular rate and rhythm, no rubs, no gallops Respiratory: Clear to auscultation bilaterally, no wheezing, no crackles, no rhonchi Abdominal: Soft, non tender, non distended, bowel sounds +, no guarding  Discharge Instructions  Discharge Instructions    Diet - low sodium heart healthy    Complete by:  As directed      Increase activity slowly    Complete by:  As directed             Medication  List    STOP taking these medications        docusate sodium 100 MG capsule  Commonly known as:  COLACE     traMADol 50 MG tablet  Commonly known as:  ULTRAM      TAKE these medications        acetaminophen 500 MG tablet  Commonly known as:  TYLENOL  Take 2 tablets (1,000 mg total) by mouth every 8 (eight) hours as needed for mild pain, fever or headache.     carvedilol 3.125 MG tablet  Commonly known as:  COREG  Take 3.125 mg by mouth daily.     ciprofloxacin 500 MG tablet  Commonly known as:  CIPRO  Take 1 tablet (500 mg total) by mouth 2 (two) times daily.     finasteride 5 MG tablet  Commonly known as:  PROSCAR  Take 5 mg by mouth daily.     loperamide 2 MG capsule  Commonly known as:  IMODIUM  Take 2 mg by mouth as needed for diarrhea or loose stools. Do not exceed 8 doses in 24 hours     neomycin-bacitracin-polymyxin [phone] ointment  Apply 1 application topically as needed (for minor skin tears or abrasions).     nitroGLYCERIN 0.4 MG SL tablet  Commonly known as:  NITROSTAT  Place 0.4 mg under the tongue every 5 (five) minutes as needed for chest pain.     pantoprazole 40 MG tablet  Commonly known as:  PROTONIX  Take 1 tablet (40 mg total) by mouth daily.     polyethylene glycol packet  Commonly known as:  MIRALAX / GLYCOLAX  Take 17 g by mouth daily.     spironolactone 25 MG tablet  Commonly known as:  ALDACTONE  Take 25 mg by mouth daily.     tamsulosin 0.4 MG Caps capsule  Commonly known as:  FLOMAX  Take 0.4 mg by mouth daily.     Vitamin D (Ergocalciferol) 50000 UNITS Caps capsule  Commonly known as:  DRISDOL  Take 50,000 Units by mouth every Wednesday.           Follow-up Information    Follow up with [name], MD.   Specialty:  Internal Medicine   Contact information:   [address] [place] [address] [phone]       Call Faye Ramsay, MD.   Specialty:  Internal Medicine   Why:  my cell phone  [phone] to follow up on urine culture    Contact information:   [address] [place] [address] [phone]       Follow up with Karen Kays, NP On 03/13/2015.   Specialty:  Nurse Practitioner   Why:  appointment scheduled for 10 am December 29th, 2016    Contact information:   [address] [place] [address] [phone]        The results of significant diagnostics from this hospitalization (including imaging, microbiology, ancillary and laboratory) are listed below for reference.     Microbiology: No results found for this or any previous visit (from the past 240 hour(s)).   Labs: Basic Metabolic Panel:  Recent Labs Lab 03/02/15 1309 03/05/15 0747 03/06/15 0456  NA 137 139 137  K 4.2 4.3 4.0  CL 107 106 104  CO2 23 26 24   GLUCOSE 103* 122* 109*  BUN 13  22* 24*  CREATININE 1.35* 1.45* 1.44*  CALCIUM 9.0 9.1 8.5*   Liver Function Tests:  Recent Labs Lab 03/02/15 1309 03/05/15 0747  AST 17 13*  ALT 12* 11*  ALKPHOS 86 93  BILITOT 0.5 1.2  PROT 5.7* 7.1  ALBUMIN 3.1* 3.7    Recent Labs Lab 03/02/15 1309 03/05/15 0747  LIPASE 39 27   CBC:  Recent Labs Lab 03/02/15 1309 03/05/15 0747 03/06/15 0456  WBC 7.9 12.6* 10.5  NEUTROABS 5.1 9.8*  --   HGB 13.7 13.9 12.1*  HCT 41.1 42.1 36.2*  MCV 90.5 91.1 89.6  PLT 177 167 157   Cardiac Enzymes: No results for input(s): CKTOTAL, CKMB, CKMBINDEX, TROPONINI in the last 168 hours. BNP: BNP (last 3 results)  Recent Labs  05/26/14 0933  BNP 51.8    ProBNP (last 3 results) No results for input(s): PROBNP in the last 8760 hours.  CBG:  Recent Labs Lab 03/06/15 0736  GLUCAP 104*     SIGNED: Time coordinating discharge: 30 minutes  Faye Ramsay, MD  [facility] 03/06/2015, 10:12 AM Pager [phone]  If 7PM-7AM, please contact night-coverage [email or web address] Password [email or web address]

## 2015-03-06 NOTE — Evaluation (Signed)
Physical Therapy Evaluation Patient Details Name: MAXIE KLEMP MRN: [id number] DOB: 1925/05/29 Today's Date: 03/06/2015   History of Present Illness  81 yo male admitted with UTI. Hx of fall, T12 comp fx, NSTEMI, MI, HTN, neuropathy, CHF. Pt is from ALF  Clinical Impression  On eval, pt required Min-Mod assist for mobility-walked ~75 feet with RW. Pain with lying flat-only complaint was when getting back into flat bed. Daughter present during session-plan is to return to ALF. Recommend HHPT follow up at ALF.     Follow Up Recommendations Home health PT;Supervision/Assistance - 24 hour (pt tends to send them away per daughter)    Equipment Recommendations  None recommended by PT    Recommendations for Other Services       Precautions / Restrictions Precautions Precautions: Fall Restrictions Weight Bearing Restrictions: No      Mobility  Bed Mobility Overal bed mobility: Needs Assistance Bed Mobility: Supine to Sit;Sit to Supine     Supine to sit: Mod assist Sit to supine: Mod assist   General bed mobility comments: Assist for trunk and LEs. Increased time. Pt normally sleeps in a recliner  Transfers Overall transfer level: Needs assistance Equipment used: Rolling walker (2 wheeled) Transfers: Sit to/from Stand Sit to Stand: Min assist         General transfer comment: Assist to rise, stabilize, control descent. VCs safety, hand placement  Ambulation/Gait Ambulation/Gait assistance: Min assist;Min guard Ambulation Distance (Feet): 75 Feet Assistive device: Rolling walker (2 wheeled) Gait Pattern/deviations: Step-through pattern;Decreased stride length;Trunk flexed     General Gait Details: assist to stabilize intermittently. slow gait speed. VCs posture. Pt tends to keep RW too far ahead (uses rollator)  Stairs            Wheelchair Mobility    Modified Rankin (Stroke Patients Only)       Balance Overall balance assessment: Needs  assistance;History of Falls         Standing balance support: Bilateral upper extremity supported;During functional activity Standing balance-Leahy Scale: Poor Standing balance comment: needs RW                             Pertinent Vitals/Pain Pain Assessment: No/denies pain    Home Living Family/patient expects to be discharged to:: Assisted living               Home Equipment: Walker - 4 wheels Additional Comments: lives with wife in ALF, she can't assist pt     Prior Function Level of Independence: Independent with assistive device(s)         Comments: uses walker     Hand Dominance        Extremity/Trunk Assessment   Upper Extremity Assessment: Generalized weakness           Lower Extremity Assessment: Generalized weakness      Cervical / Trunk Assessment: Kyphotic  Communication   Communication: No difficulties  Cognition Arousal/Alertness: Awake/alert Behavior During Therapy: WFL for tasks assessed/performed Overall Cognitive Status: Within Functional Limits for tasks assessed                      General Comments      Exercises        Assessment/Plan    PT Assessment Patient needs continued PT services  PT Diagnosis Difficulty walking;Generalized weakness   PT Problem List Decreased strength;Decreased activity tolerance;Decreased balance;Decreased mobility  PT Treatment Interventions Gait training;Functional  mobility training;Therapeutic activities;Patient/family education;Balance training;Therapeutic exercise   PT Goals (Current goals can be found in the Care Plan section) Acute Rehab PT Goals Patient Stated Goal: home today PT Goal Formulation: With patient/family Time For Goal Achievement: 03/20/15 Potential to Achieve Goals: Fair    Frequency Min 3X/week   Barriers to discharge        Co-evaluation               End of Session Equipment Utilized During Treatment: Gait belt Activity  Tolerance: Patient tolerated treatment well Patient left: in bed;with call bell/phone within reach;with bed alarm set;with family/visitor present           Time: [id number] PT Time Calculation (min) (ACUTE ONLY): 22 min   Charges:   PT Evaluation $Initial PT Evaluation Tier I: 1 Procedure     PT G Codes:        Weston Anna, MPT Pager: [phone]

## 2015-03-07 LAB — URINE CULTURE

## 2015-03-07 LAB — GLUCOSE, CAPILLARY: GLUCOSE-CAPILLARY: 120 mg/dL — AB (ref 65–99)

## 2015-03-07 MED ORDER — BISACODYL 10 MG RE SUPP
10.0000 mg | Freq: Once | RECTAL | Status: AC
  Administered 2015-03-07: 10 mg via RECTAL
  Filled 2015-03-07: qty 1

## 2015-03-07 MED ORDER — SENNOSIDES-DOCUSATE SODIUM 8.6-50 MG PO TABS
1.0000 | ORAL_TABLET | Freq: Two times a day (BID) | ORAL | Status: DC
  Administered 2015-03-07: 1 via ORAL
  Filled 2015-03-07: qty 1

## 2015-03-07 NOTE — Progress Notes (Signed)
Discussed with patient and daughter discharge instructions, both verbalized agreement and understanding.  Patient's belongings taken by daughter.  Patient awaiting transport to take him home by stretcher.

## 2015-03-07 NOTE — Progress Notes (Signed)
Physical Therapy Treatment Patient Details Name: Eduardo Salazar MRN: [id number] DOB: 17-Jul-1925 Today's Date: 04/05/2015    History of Present Illness 81 yo male admitted with UTI. Hx of fall, T12 comp fx, NSTEMI, MI, HTN, neuropathy, CHF. Pt is from ALF    PT Comments    Pt assisted with ambulating in hallway.  Utilized rollator today and pt tolerated 200 feet of ambulation.  Daughter present and reports d/c likely today and pt to go home to her house.  Follow Up Recommendations  Home health PT;Supervision/Assistance - 24 hour     Equipment Recommendations  None recommended by PT    Recommendations for Other Services       Precautions / Restrictions Precautions Precautions: Fall    Mobility  Bed Mobility Overal bed mobility: Needs Assistance Bed Mobility: Supine to Sit     Supine to sit: Mod assist     General bed mobility comments: Assist for trunk and LEs, assist from daughter  Transfers Overall transfer level: Needs assistance Equipment used: 4-wheeled walker Transfers: Sit to/from Stand Sit to Stand: Min assist         General transfer comment: Assist to rise, stabilize, control descent. VCs safety, hand placement  Ambulation/Gait Ambulation/Gait assistance: Min guard Ambulation Distance (Feet): 200 Feet Assistive device: 4-wheeled walker Gait Pattern/deviations: Step-through pattern;Trunk flexed;Decreased stride length     General Gait Details:  slow gait speed. VCs posture and rollator position. Pt tends to keep rollator too far ahead   Stairs            Wheelchair Mobility    Modified Rankin (Stroke Patients Only)       Balance                                    Cognition Arousal/Alertness: Awake/alert Behavior During Therapy: WFL for tasks assessed/performed Overall Cognitive Status: Within Functional Limits for tasks assessed                      Exercises      General Comments        Pertinent  Vitals/Pain Pain Assessment: No/denies pain    Home Living                      Prior Function            PT Goals (current goals can now be found in the care plan section) Progress towards PT goals: Progressing toward goals    Frequency  Min 3X/week    PT Plan Current plan remains appropriate    Co-evaluation             End of Session Equipment Utilized During Treatment: Gait belt Activity Tolerance: Patient tolerated treatment well Patient left: in chair;with call bell/phone within reach;with chair alarm set     Time: 1015-1031 PT Time Calculation (min) (ACUTE ONLY): 16 min  Charges:  $Gait Training: 8-22 mins                    G Codes:      Perlita Forbush,KATHrine E 04/05/15, 1:19 PM Carmelia Bake, PT, DPT 2015-04-05 Pager: [phone]

## 2015-03-07 NOTE — Progress Notes (Signed)
Patient IV has infiltrated, area is swollen and red.  IV was discontinued with warm compress applied.  Will continue to monitor.

## 2015-03-07 NOTE — Care Management Note (Signed)
Case Management Note  Patient Details  Name: Eduardo Salazar MRN: [id number] Date of Birth: 1925-05-05  Subjective/Objective: Spoke to dtr Lear Ng, patient will stay w/her at home address:[address] c#[phone], [facility] chosen for [facility]/PT/OT/aide/CSW, 3n1.TC [facility] dme rep(Lecretia), & [facility] rep(Karen) aware of d/c & orders.Ambulance transp-CSW already following.                  Action/Plan:d/c home w/[facility]HC.   Expected Discharge Date:   (UNKNOWN)               Expected Discharge Plan:  [place]  In-House Referral:  Clinical Social Work  Discharge planning Services  CM Consult  Post Acute Care Choice:    Choice offered to:  Adult Children  DME Arranged:  3-N-1 DME Agency:  [place] Arranged:  RN, PT, OT, Nurse's Aide, Social Work [employer] Agency:  [facility]  Status of Service:  Completed, signed off  Medicare Important Message Given:    Date Medicare IM Given:    Medicare IM give by:    Date Additional Medicare IM Given:    Additional Medicare Important Message give by:     If discussed at [place] of Stay Meetings, dates discussed:    Additional Comments:  Dessa Phi, RN 03/07/2015, 11:14 AM

## 2015-03-07 NOTE — Progress Notes (Signed)
Pt seen and examined at bedside, asking to go home. He is stable for d/c home, please see d/c summary from 03/06/2015, no changes needed.   Faye Ramsay, MD  [facility] Pager [phone]  If 7PM-7AM, please contact night-coverage [email or web address] Password [email or web address]

## 2015-03-08 LAB — URINE CULTURE: Culture: >=100000

## 2015-11-18 ENCOUNTER — Other Ambulatory Visit: Payer: Self-pay

## 2016-06-25 ENCOUNTER — Emergency Department (HOSPITAL_COMMUNITY): Payer: Medicare Other

## 2016-06-25 ENCOUNTER — Inpatient Hospital Stay (HOSPITAL_COMMUNITY)
Admission: EM | Admit: 2016-06-25 | Discharge: 2016-06-27 | DRG: 669 | Disposition: A | Discharge status: Home Health | Payer: Medicare Other | Source: Skilled Nursing Facility | Attending: Internal Medicine | Admitting: Internal Medicine

## 2016-06-25 ENCOUNTER — Encounter (HOSPITAL_COMMUNITY): Payer: Self-pay

## 2016-06-25 DIAGNOSIS — Z85828 Personal history of other malignant neoplasm of skin: Secondary | ICD-10-CM

## 2016-06-25 DIAGNOSIS — I252 Old myocardial infarction: Secondary | ICD-10-CM

## 2016-06-25 DIAGNOSIS — L8990 Pressure ulcer of unspecified site, unspecified stage: Secondary | ICD-10-CM | POA: Insufficient documentation

## 2016-06-25 DIAGNOSIS — J209 Acute bronchitis, unspecified: Secondary | ICD-10-CM | POA: Diagnosis present

## 2016-06-25 DIAGNOSIS — K635 Polyp of colon: Secondary | ICD-10-CM | POA: Diagnosis present

## 2016-06-25 DIAGNOSIS — N183 Chronic kidney disease, stage 3 unspecified: Secondary | ICD-10-CM | POA: Diagnosis present

## 2016-06-25 DIAGNOSIS — Z87442 Personal history of urinary calculi: Secondary | ICD-10-CM

## 2016-06-25 DIAGNOSIS — I129 Hypertensive chronic kidney disease with stage 1 through stage 4 chronic kidney disease, or unspecified chronic kidney disease: Secondary | ICD-10-CM | POA: Diagnosis present

## 2016-06-25 DIAGNOSIS — R109 Unspecified abdominal pain: Secondary | ICD-10-CM

## 2016-06-25 DIAGNOSIS — K5730 Diverticulosis of large intestine without perforation or abscess without bleeding: Secondary | ICD-10-CM | POA: Diagnosis present

## 2016-06-25 DIAGNOSIS — E860 Dehydration: Secondary | ICD-10-CM

## 2016-06-25 DIAGNOSIS — R0600 Dyspnea, unspecified: Secondary | ICD-10-CM

## 2016-06-25 DIAGNOSIS — R600 Localized edema: Secondary | ICD-10-CM | POA: Diagnosis present

## 2016-06-25 DIAGNOSIS — Z87891 Personal history of nicotine dependence: Secondary | ICD-10-CM

## 2016-06-25 DIAGNOSIS — E861 Hypovolemia: Secondary | ICD-10-CM | POA: Diagnosis present

## 2016-06-25 DIAGNOSIS — N179 Acute kidney failure, unspecified: Secondary | ICD-10-CM | POA: Diagnosis not present

## 2016-06-25 DIAGNOSIS — R1032 Left lower quadrant pain: Secondary | ICD-10-CM

## 2016-06-25 DIAGNOSIS — J40 Bronchitis, not specified as acute or chronic: Secondary | ICD-10-CM

## 2016-06-25 DIAGNOSIS — N201 Calculus of ureter: Secondary | ICD-10-CM | POA: Diagnosis not present

## 2016-06-25 DIAGNOSIS — R110 Nausea: Secondary | ICD-10-CM

## 2016-06-25 DIAGNOSIS — Z79899 Other long term (current) drug therapy: Secondary | ICD-10-CM

## 2016-06-25 DIAGNOSIS — I2510 Atherosclerotic heart disease of native coronary artery without angina pectoris: Secondary | ICD-10-CM | POA: Diagnosis present

## 2016-06-25 DIAGNOSIS — Z6830 Body mass index (BMI) 30.0-30.9, adult: Secondary | ICD-10-CM

## 2016-06-25 DIAGNOSIS — I10 Essential (primary) hypertension: Secondary | ICD-10-CM | POA: Diagnosis present

## 2016-06-25 DIAGNOSIS — K5900 Constipation, unspecified: Secondary | ICD-10-CM | POA: Diagnosis present

## 2016-06-25 DIAGNOSIS — N400 Enlarged prostate without lower urinary tract symptoms: Secondary | ICD-10-CM | POA: Diagnosis present

## 2016-06-25 DIAGNOSIS — R531 Weakness: Secondary | ICD-10-CM

## 2016-06-25 DIAGNOSIS — Z923 Personal history of irradiation: Secondary | ICD-10-CM

## 2016-06-25 DIAGNOSIS — Z8711 Personal history of peptic ulcer disease: Secondary | ICD-10-CM

## 2016-06-25 DIAGNOSIS — Z8249 Family history of ischemic heart disease and other diseases of the circulatory system: Secondary | ICD-10-CM

## 2016-06-25 HISTORY — DX: Gastric ulcer, unspecified as acute or chronic, without hemorrhage or perforation: K25.9

## 2016-06-25 HISTORY — DX: Malignant (primary) neoplasm, unspecified: C80.1

## 2016-06-25 HISTORY — DX: Chronic kidney disease, stage 3 (moderate): N18.3

## 2016-06-25 HISTORY — DX: Chronic kidney disease, stage 3 unspecified: N18.30

## 2016-06-25 LAB — COMPREHENSIVE METABOLIC PANEL
ALBUMIN: 3.4 g/dL — AB (ref 3.5–5.0)
ALK PHOS: 100 U/L (ref 38–126)
ALT: 17 U/L (ref 17–63)
AST: 21 U/L (ref 15–41)
Anion gap: 9 (ref 5–15)
BILIRUBIN TOTAL: 0.7 mg/dL (ref 0.3–1.2)
BUN: 23 mg/dL — AB (ref 6–20)
CO2: 25 mmol/L (ref 22–32)
Calcium: 9 mg/dL (ref 8.9–10.3)
Chloride: 104 mmol/L (ref 101–111)
Creatinine, Ser: 1.7 mg/dL — ABNORMAL HIGH (ref 0.61–1.24)
GFR calc Af Amer: 40 mL/min — ABNORMAL LOW (ref >60)
GFR calc non Af Amer: 34 mL/min — ABNORMAL LOW (ref >60)
GLUCOSE: 125 mg/dL — AB (ref 65–99)
POTASSIUM: 3.9 mmol/L (ref 3.5–5.1)
SODIUM: 138 mmol/L (ref 135–145)
TOTAL PROTEIN: 7.3 g/dL (ref 6.5–8.1)

## 2016-06-25 LAB — CBC
HEMATOCRIT: 39.8 % (ref 39.0–52.0)
HEMOGLOBIN: 13.4 g/dL (ref 13.0–17.0)
MCH: 29.3 pg (ref 26.0–34.0)
MCHC: 33.7 g/dL (ref 30.0–36.0)
MCV: 86.9 fL (ref 78.0–100.0)
Platelets: 205 10*3/uL (ref 150–400)
RBC: 4.58 MIL/uL (ref 4.22–5.81)
RDW: 15 % (ref 11.5–15.5)
WBC: 9.8 10*3/uL (ref 4.0–10.5)

## 2016-06-25 LAB — URINALYSIS, ROUTINE W REFLEX MICROSCOPIC
Bilirubin Urine: NEGATIVE
Glucose, UA: NEGATIVE mg/dL
Hgb urine dipstick: NEGATIVE
KETONES UR: NEGATIVE mg/dL
Leukocytes, UA: NEGATIVE
NITRITE: NEGATIVE
PH: 5 (ref 5.0–8.0)
Protein, ur: NEGATIVE mg/dL
SPECIFIC GRAVITY, URINE: 1.045 — AB (ref 1.005–1.030)

## 2016-06-25 LAB — INFLUENZA PANEL BY PCR (TYPE A & B)
Influenza A By PCR: NEGATIVE
Influenza B By PCR: NEGATIVE

## 2016-06-25 LAB — TROPONIN I: Troponin I: <0.03 ng/mL (ref <0.03)

## 2016-06-25 LAB — LIPASE, BLOOD: Lipase: 16 U/L (ref 11–51)

## 2016-06-25 MED ORDER — CHLORHEXIDINE GLUCONATE 0.12 % MT SOLN
15.0000 mL | Freq: Two times a day (BID) | OROMUCOSAL | Status: DC
  Administered 2016-06-25 – 2016-06-27 (×4): 15 mL via OROMUCOSAL
  Filled 2016-06-25 (×4): qty 15

## 2016-06-25 MED ORDER — ALBUTEROL SULFATE (2.5 MG/3ML) 0.083% IN NEBU
2.5000 mg | INHALATION_SOLUTION | Freq: Four times a day (QID) | RESPIRATORY_TRACT | Status: DC
  Administered 2016-06-25 (×2): 2.5 mg via RESPIRATORY_TRACT
  Filled 2016-06-25 (×3): qty 3

## 2016-06-25 MED ORDER — ONDANSETRON HCL 4 MG/2ML IJ SOLN: 4.0000 mg | Freq: Four times a day (QID) | INTRAMUSCULAR | Status: DC | PRN

## 2016-06-25 MED ORDER — PEG-KCL-NACL-NASULF-NA ASC-C 100 G PO SOLR
0.5000 | Freq: Once | ORAL | Status: AC
  Administered 2016-06-25: 100 g via ORAL

## 2016-06-25 MED ORDER — PEG-KCL-NACL-NASULF-NA ASC-C 100 G PO SOLR
0.5000 | Freq: Once | ORAL | Status: AC
  Administered 2016-06-25: 100 g via ORAL
  Filled 2016-06-25: qty 1

## 2016-06-25 MED ORDER — IOPAMIDOL (ISOVUE-300) INJECTION 61%
75.0000 mL | Freq: Once | INTRAVENOUS | Status: AC | PRN
Start: 2016-06-25 — End: 2016-06-25
  Administered 2016-06-25: 75 mL via INTRAVENOUS

## 2016-06-25 MED ORDER — IOPAMIDOL (ISOVUE-300) INJECTION 61%
INTRAVENOUS | Status: AC
  Administered 2016-06-25: 30 mL via ORAL
  Filled 2016-06-25: qty 30

## 2016-06-25 MED ORDER — HEPARIN SODIUM (PORCINE) 5000 UNIT/ML IJ SOLN
5000.0000 [IU] | Freq: Three times a day (TID) | INTRAMUSCULAR | Status: DC
  Administered 2016-06-25 – 2016-06-27 (×5): 5000 [IU] via SUBCUTANEOUS
  Filled 2016-06-25 (×4): qty 1

## 2016-06-25 MED ORDER — PEG-KCL-NACL-NASULF-NA ASC-C 100 G PO SOLR: 1.0000 | Freq: Once | ORAL | Status: DC

## 2016-06-25 MED ORDER — ONDANSETRON HCL 4 MG/2ML IJ SOLN
4.0000 mg | Freq: Once | INTRAMUSCULAR | Status: AC
  Administered 2016-06-25: 4 mg via INTRAVENOUS
  Filled 2016-06-25: qty 2

## 2016-06-25 MED ORDER — METRONIDAZOLE IN NACL 5-0.79 MG/ML-% IV SOLN
500.0000 mg | Freq: Three times a day (TID) | INTRAVENOUS | Status: DC
Start: 2016-06-25 — End: 2016-06-26
  Administered 2016-06-25 – 2016-06-26 (×3): 500 mg via INTRAVENOUS
  Filled 2016-06-25 (×3): qty 100

## 2016-06-25 MED ORDER — IOPAMIDOL (ISOVUE-300) INJECTION 61%
30.0000 mL | Freq: Once | INTRAVENOUS | Status: AC | PRN
  Administered 2016-06-25: 30 mL via ORAL

## 2016-06-25 MED ORDER — SODIUM CHLORIDE 0.9 % IV SOLN
1000.0000 mL | INTRAVENOUS | Status: DC
  Administered 2016-06-25: 1000 mL via INTRAVENOUS

## 2016-06-25 MED ORDER — GUAIFENESIN 100 MG/5ML PO SOLN
200.0000 mg | Freq: Four times a day (QID) | ORAL | Status: DC | PRN
  Administered 2016-06-25 – 2016-06-27 (×3): 200 mg via ORAL
  Filled 2016-06-25 (×2): qty 10
  Filled 2016-06-25: qty 20

## 2016-06-25 MED ORDER — CARVEDILOL 3.125 MG PO TABS
3.1250 mg | ORAL_TABLET | Freq: Every day | ORAL | Status: DC
  Administered 2016-06-26 – 2016-06-27 (×2): 3.125 mg via ORAL
  Filled 2016-06-25 (×3): qty 1

## 2016-06-25 MED ORDER — FINASTERIDE 5 MG PO TABS
5.0000 mg | ORAL_TABLET | Freq: Every day | ORAL | Status: DC
  Administered 2016-06-25 – 2016-06-27 (×3): 5 mg via ORAL
  Filled 2016-06-25 (×3): qty 1

## 2016-06-25 MED ORDER — FAMOTIDINE 20 MG PO TABS
20.0000 mg | ORAL_TABLET | Freq: Every day | ORAL | Status: DC
  Administered 2016-06-25 – 2016-06-26 (×2): 20 mg via ORAL
  Filled 2016-06-25 (×2): qty 1

## 2016-06-25 MED ORDER — ACETAMINOPHEN 325 MG PO TABS
650.0000 mg | ORAL_TABLET | Freq: Four times a day (QID) | ORAL | Status: DC | PRN
  Administered 2016-06-25 – 2016-06-26 (×2): 650 mg via ORAL
  Filled 2016-06-25 (×4): qty 2

## 2016-06-25 MED ORDER — ACETAMINOPHEN 650 MG RE SUPP: 650.0000 mg | Freq: Four times a day (QID) | RECTAL | Status: DC | PRN

## 2016-06-25 MED ORDER — NITROGLYCERIN 0.4 MG SL SUBL: 0.4000 mg | SUBLINGUAL_TABLET | SUBLINGUAL | Status: DC | PRN

## 2016-06-25 MED ORDER — ONDANSETRON HCL 4 MG PO TABS: 4.0000 mg | ORAL_TABLET | Freq: Four times a day (QID) | ORAL | Status: DC | PRN

## 2016-06-25 MED ORDER — SODIUM CHLORIDE 0.9 % IV SOLN
INTRAVENOUS | Status: DC
  Administered 2016-06-25: 15:00:00 via INTRAVENOUS

## 2016-06-25 MED ORDER — TAMSULOSIN HCL 0.4 MG PO CAPS
0.4000 mg | ORAL_CAPSULE | Freq: Two times a day (BID) | ORAL | Status: DC
  Administered 2016-06-25 – 2016-06-27 (×4): 0.4 mg via ORAL
  Filled 2016-06-25 (×4): qty 1

## 2016-06-25 MED ORDER — DEXTROSE 5 % IV SOLN
1.0000 g | INTRAVENOUS | Status: DC
  Administered 2016-06-25: 1 g via INTRAVENOUS
  Filled 2016-06-25 (×3): qty 10

## 2016-06-25 MED ORDER — SODIUM CHLORIDE 0.9 % IV SOLN
1000.0000 mL | Freq: Once | INTRAVENOUS | Status: AC
  Administered 2016-06-25: 1000 mL via INTRAVENOUS

## 2016-06-25 MED ORDER — ACETAMINOPHEN 325 MG PO TABS
650.0000 mg | ORAL_TABLET | Freq: Four times a day (QID) | ORAL | Status: DC | PRN
  Administered 2016-06-25 – 2016-06-27 (×2): 650 mg via ORAL

## 2016-06-25 MED ORDER — ORAL CARE MOUTH RINSE: 15.0000 mL | Freq: Two times a day (BID) | OROMUCOSAL | Status: DC

## 2016-06-25 MED ORDER — IOPAMIDOL (ISOVUE-300) INJECTION 61%
INTRAVENOUS | Status: AC
  Filled 2016-06-25: qty 75

## 2016-06-25 NOTE — Consult Note (Signed)
Consultation  Referring Provider:  Dr. Tyrell Antonio    Primary Care Physician:  [name], MD Primary Gastroenterologist: Dr. Hilarie Fredrickson        Reason for Consultation: Abnormal CT scan, Abdominal Pain, Nausea, Decrease in appetite             HPI:   Eduardo Salazar is a 83 y.o. Caucasian male with a past medical history of hypertension and others listed below, who presented to the ED today from his assisted living facility with a complaint of nausea, abdominal pain and a decrease in appetite over the past 3-4 days.   At time of interview he is found laying in bed with his daughter by his bedside, she does provide much of his history as he is a poor historian. Between them both we still have quite a few details missing. She describes that he developed a viral illness about 4-5 days ago with a chest cold/head cold and when she went to see him yesterday, he told her he had not eaten for 2 or 3 days. He did develop nausea this morning and had some vomiting after prepping for his CT. He has been complaining of lower abdominal pain which is "new" per his daughter. He does tell me this pain is somewhat better at time of my exam. Apparently he had been evaluated by his PCP for this and was started onan antibiotic and a chest x-ray at that time was negative for pneumonia.   Patient relays that he has bowel movements with help of a daily stool softener but does not remember one since earlier this week. He cannot tell me if he is passing gas or not.  ED Course: Cr at 1.7, hb 13.4, influenza negative, Chest x ray; no active cardiopulmonary diseases. CT abdomen pelvis; Focal thickening of rectosigmoid junction is noted concerning for possible neoplasm. Sigmoidoscopy is recommended for further evaluation. Probable sludge seen within the gallbladder. Severe left renal atrophy with nonobstructive calculus. No hydronephrosis or ureteral dilatation is noted. However, 9 mm nonobstructive calculus is seen in the distal  right ureter.  Previous GI history: 11/14/12-EGD, Dr. Hilarie Fredrickson: Impression: Mucosa of the esophagus appeared normal, mucosa of the stomach appeared normal and previously seen gastric ulcer healed, mild deformity/scar at the pyloric channel without significant narrowing, one of mucosa showed no other abnormalities in the bulb and second portion of duodenum.  Past Medical History:  Diagnosis Date  . BPH (benign prostatic hyperplasia)   . Hx of radiation therapy 01/15/13- 02/13/13   left temple 5000 cGy 20 sessions  . Hypertension   . Myocardial infarction 2014   mild"no symptoms" was told 2 months ago  . skin ca    top of head; xrt comp    Past Surgical History:  Procedure Laterality Date  . APPENDECTOMY    . CATARACT EXTRACTION, BILATERAL    . CYSTOSCOPY W/ URETERAL STENT PLACEMENT  02/2002   on left for ureteral stone  . CYSTOSCOPY WITH RETROGRADE PYELOGRAM, URETEROSCOPY AND STENT PLACEMENT Left 07/31/2012   Procedure: CYSTOSCOPY WITH LEFT RETROGRADE PYELOGRAM, LEFT URETEROSCOPY;  Surgeon: Claybon Jabs, MD;  Location: [facility];  Service: Urology;  Laterality: Left;  . ESOPHAGOGASTRODUODENOSCOPY N/A 09/14/2012   Procedure: ESOPHAGOGASTRODUODENOSCOPY (EGD);  Surgeon: Jerene Bears, MD;  Location: [facility];  Service: Gastroenterology;  Laterality: N/A;  . ESOPHAGOGASTRODUODENOSCOPY N/A 11/14/2012   Procedure: ESOPHAGOGASTRODUODENOSCOPY (EGD);  Surgeon: Jerene Bears, MD;  Location: [facility];  Service: Gastroenterology;  Laterality: N/A;  .  MOHS SURGERY Left 11/28/12   forehead  . SKIN BIOPSY Left 10/27/12   left forehead near frontal scalp  . VASECTOMY    . WRIST SURGERY     "nerve repair"    Family History  Problem Relation Age of Onset  . Heart attack Father   . Arthritis Mother   . Colon cancer Neg Hx     Social History  Substance Use Topics  . Smoking status: Former Smoker    Packs/day: 0.50    Years: 22.00    Types: Cigarettes    Quit date: 07/27/1973  . Smokeless  tobacco: Never Used  . Alcohol use No    Prior to Admission medications   Medication Sig Start Date End Date Taking? Authorizing Provider  acetaminophen (TYLENOL) 500 MG tablet Take 2 tablets (1,000 mg total) by mouth every 8 (eight) hours as needed for mild pain, fever or headache. Patient taking differently: Take 500 mg by mouth every 4 (four) hours as needed for mild pain, fever or headache.  05/28/14  Yes [name], MD  carvedilol (COREG) 3.125 MG tablet Take 3.125 mg by mouth daily.    Yes Historical Provider, MD  docusate sodium (COLACE) 100 MG capsule Take 100 mg by mouth daily.   Yes Historical Provider, MD  famotidine (PEPCID) 20 MG tablet Take 20 mg by mouth at bedtime. 06/11/16  Yes Historical Provider, MD  finasteride (PROSCAR) 5 MG tablet Take 5 mg by mouth daily.  11/02/12  Yes Historical Provider, MD  furosemide (LASIX) 20 MG tablet Take 20 mg by mouth daily. 06/23/16  Yes Historical Provider, MD  guaifenesin (ROBITUSSIN) 100 MG/5ML syrup Take 200 mg by mouth every 6 (six) hours as needed for cough.   Yes Historical Provider, MD  levofloxacin (LEVAQUIN) 750 MG tablet Take 750 mg by mouth daily. 06/21/16  Yes Historical Provider, MD  polyvinyl alcohol (ARTIFICIAL TEARS) 1.4 % ophthalmic solution Place 1 drop into both eyes 4 (four) times daily.   Yes Historical Provider, MD  spironolactone (ALDACTONE) 25 MG tablet Take 25 mg by mouth daily.  04/15/14  Yes Historical Provider, MD  tamsulosin (FLOMAX) 0.4 MG CAPS Take 0.4 mg by mouth 2 (two) times daily.    Yes Historical Provider, MD  Vitamin D, Ergocalciferol, (DRISDOL) 50000 UNITS CAPS capsule Take 50,000 Units by mouth every 7 (seven) days.  02/13/15  Yes Historical Provider, MD  nitroGLYCERIN (NITROSTAT) 0.4 MG SL tablet Place 0.4 mg under the tongue every 5 (five) minutes as needed for chest pain.    Historical Provider, MD  pantoprazole (PROTONIX) 40 MG tablet Take 1 tablet (40 mg total) by mouth daily. Patient not taking:  Reported on 06/25/2016 05/28/14   [name], MD  polyethylene glycol [facility] / [name]) packet Take 17 g by mouth daily. Patient not taking: Reported on 06/25/2016 05/28/14   [name], MD    Current Facility-Administered Medications  Medication Dose Route Frequency Provider Last Rate Last Dose  . acetaminophen (TYLENOL) tablet 650 mg  650 mg Oral Q6H PRN Belkys A Regalado, MD   650 mg at 06/25/16 1138  . albuterol (PROVENTIL) (2.5 MG/3ML) 0.083% nebulizer solution 2.5 mg  2.5 mg Nebulization Q6H Belkys A Regalado, MD      . iopamidol (ISOVUE-300) 61 % injection           . metroNIDAZOLE (FLAGYL) IVPB 500 mg  500 mg Intravenous Q8H Belkys A Regalado, MD 100 mL/hr at 06/25/16 1140 500 mg at 06/25/16 1140  Current Outpatient Prescriptions  Medication Sig Dispense Refill  . acetaminophen (TYLENOL) 500 MG tablet Take 2 tablets (1,000 mg total) by mouth every 8 (eight) hours as needed for mild pain, fever or headache. (Patient taking differently: Take 500 mg by mouth every 4 (four) hours as needed for mild pain, fever or headache. ) 30 tablet 0  . carvedilol (COREG) 3.125 MG tablet Take 3.125 mg by mouth daily.     Marland Kitchen docusate sodium (COLACE) 100 MG capsule Take 100 mg by mouth daily.    . famotidine (PEPCID) 20 MG tablet Take 20 mg by mouth at bedtime.    . finasteride (PROSCAR) 5 MG tablet Take 5 mg by mouth daily.     . furosemide (LASIX) 20 MG tablet Take 20 mg by mouth daily.    Marland Kitchen guaifenesin (ROBITUSSIN) 100 MG/5ML syrup Take 200 mg by mouth every 6 (six) hours as needed for cough.    Marland Kitchen levofloxacin (LEVAQUIN) 750 MG tablet Take 750 mg by mouth daily.    . polyvinyl alcohol (ARTIFICIAL TEARS) 1.4 % ophthalmic solution Place 1 drop into both eyes 4 (four) times daily.    Marland Kitchen spironolactone (ALDACTONE) 25 MG tablet Take 25 mg by mouth daily.     . tamsulosin (FLOMAX) 0.4 MG CAPS Take 0.4 mg by mouth 2 (two) times daily.     . Vitamin D, Ergocalciferol, (DRISDOL) 50000 UNITS CAPS  capsule Take 50,000 Units by mouth every 7 (seven) days.     . nitroGLYCERIN (NITROSTAT) 0.4 MG SL tablet Place 0.4 mg under the tongue every 5 (five) minutes as needed for chest pain.    . pantoprazole (PROTONIX) 40 MG tablet Take 1 tablet (40 mg total) by mouth daily. (Patient not taking: Reported on 06/25/2016) 90 tablet 3  . polyethylene glycol (MIRALAX / GLYCOLAX) packet Take 17 g by mouth daily. (Patient not taking: Reported on 06/25/2016) 14 each 0    Allergies as of 06/25/2016  . (No Known Allergies)     Review of Systems:    Constitutional: No weight loss, fever or chills Skin: No rash  Cardiovascular: No chest pain Respiratory: No SOB  Gastrointestinal: See HPI and otherwise negative Genitourinary: No dysuria Neurological: No headache Musculoskeletal: No new muscle or joint pain Hematologic: No bleeding Psychiatric: No history of depression or anxiety   Physical Exam:  Vital signs in last 24 hours: Temp:  [97.9 F (36.6 C)] 97.9 F (36.6 C) (04/13 0649) Pulse Rate:  [80-85] 80 (04/13 1145) Resp:  [17-20] 19 (04/13 1145) BP: (97-124)/(60-105) 97/66 (04/13 1145) SpO2:  [94 %-96 %] 96 % (04/13 1145)   General:   Pleasant Overweight Caucasian male appears to be in NAD, Well developed, Well nourished, alert and cooperative Head:  Normocephalic and atraumatic. Eyes:   PEERL, EOMI. No icterus. Conjunctiva pink. Ears:  Hard of hearing Neck:  Supple Throat: Oral cavity and pharynx without inflammation, swelling or lesion.  Lungs: Respirations even and unlabored. B/l wheezing Heart: Normal S1, S2. No MRG. Regular rate and rhythm. No peripheral edema, cyanosis or pallor.  Abdomen:  Soft, moderate distension, nontender. No rebound or guarding. Normal bowel sounds. No appreciable masses or hepatomegaly. Rectal:  Not performed.  Msk:  Symmetrical without gross deformities.  Extremities:  Without edema, no deformity or joint abnormality.  Neurologic:  Alert and  oriented x4;   grossly normal neurologically. Skin:   Dry and intact without significant lesions or rashes. Psychiatric:  Demonstrates good judgement and reason without abnormal affect or  behaviors.   LAB RESULTS:  Recent Labs  06/25/16 0744  WBC 9.8  HGB 13.4  HCT 39.8  PLT 205   BMET  Recent Labs  06/25/16 0744  NA 138  K 3.9  CL 104  CO2 25  GLUCOSE 125*  BUN 23*  CREATININE 1.70*  CALCIUM 9.0   LFT  Recent Labs  06/25/16 0744  PROT 7.3  ALBUMIN 3.4*  AST 21  ALT 17  ALKPHOS 100  BILITOT 0.7   PT/INR No results for input(s): LABPROT, INR in the last 72 hours.  STUDIES: Dg Chest 2 View  Result Date: 06/25/2016 CLINICAL DATA:  Cough, congestion, recent flu symptoms, weakness EXAM: CHEST  2 VIEW COMPARISON:  03/02/2015 FINDINGS: Cardiomediastinal silhouette is stable. No infiltrate or pulmonary edema. Surgical clips upper paraspinal region are again noted. No infiltrate or pulmonary edema. Mild left basilar atelectasis or scarring. Degenerative changes thoracic spine. IMPRESSION: No active cardiopulmonary disease. Electronically Signed   By: Lahoma Crocker M.D.   On: 06/25/2016 08:10   Ct Abdomen Pelvis W Contrast  Result Date: 06/25/2016 CLINICAL DATA:  Generalized abdominal pain. EXAM: CT ABDOMEN AND PELVIS WITH CONTRAST TECHNIQUE: Multidetector CT imaging of the abdomen and pelvis was performed using the standard protocol following bolus administration of intravenous contrast. CONTRAST:  16mL ISOVUE-300 IOPAMIDOL (ISOVUE-300) INJECTION 61% COMPARISON:  Radiograph same day.  CT scan of March 02, 2015. FINDINGS: Lower chest: Bronchiectasis is noted in the lower lobes bilaterally. No acute pulmonary disease is noted. Hepatobiliary: Liver appears normal. Possible sludge seen in the gallbladder lumen. Pancreas: Unremarkable. No pancreatic ductal dilatation or surrounding inflammatory changes. Spleen: Normal in size without focal abnormality. Adrenals/Urinary Tract: Adrenal glands  appear normal. Severe left renal atrophy is noted. Nonobstructive calculus is noted in the left kidney. Right kidney appears normal. No hydronephrosis or right ureteral dilatation is noted. However, there is a 9 mm nonobstructive calculus seen in the distal right ureter. Urinary bladder is unremarkable. Stomach/Bowel: Diverticulosis of descending and sigmoid colon is noted without inflammation. There is no evidence of bowel obstruction. Status post appendectomy. Focal thickening of rectosigmoid junction is noted concerning for possible neoplasm. Vascular/Lymphatic: Aortic atherosclerosis. No enlarged abdominal or pelvic lymph nodes. Reproductive: Prostatic calcifications are noted. Prostate gland appears to be normal in size. Other: Small fat containing left inguinal hernia is noted. Musculoskeletal: No acute or significant osseous findings. IMPRESSION: Focal thickening of rectosigmoid junction is noted concerning for possible neoplasm. Sigmoidoscopy is recommended for further evaluation. Bronchiectasis is noted in both lower lobes. Probable sludge seen within the gallbladder. Severe left renal atrophy with nonobstructive calculus. No hydronephrosis or ureteral dilatation is noted. However, 9 mm nonobstructive calculus is seen in the distal right ureter. Diverticulosis of descending and sigmoid colon is noted without inflammation. Aortic atherosclerosis. Electronically Signed   By: Marijo Conception, M.D.   On: 06/25/2016 10:35   Dg Abd 2 Views  Result Date: 06/25/2016 CLINICAL DATA:  Acute generalized abdominal pain. EXAM: ABDOMEN - 2 VIEW COMPARISON:  Radiographs of May 26, 2014. FINDINGS: Mildly dilated small bowel loops are noted in the right lower quadrant which may represent ileus or possibly obstruction. No colonic dilatation is noted. Mild amount of stool is noted in the colon. No free air is noted. No definite calculi are noted. IMPRESSION: Mildly dilated small bowel loops are noted in the right lower  quadrant which may represent ileus or possibly small bowel obstruction. Follow-up radiographs are recommended. Electronically Signed   By: Sabino Dick  Brooke Bonito, M.D.   On: 06/25/2016 08:11     PREVIOUS ENDOSCOPIES:            See HPI   Impression / Plan:   Impression: 1. Abnormal Ct abdomen/pelvis:See above showing both a 9 mm nonobstructive calculus in the distal right ureter as well as rectosigmoid thickening with question of neoplasm, patient also had abdominal x-ray which showed mildly dilated small bowel loops in the right lower quadrant which may represent ileus or small bowel obstruction, he is unable to tell me when he had his last bowel movement or if he is still passing gas 2. Abdominal Pain: Consider relation to rectal thickening vs kidney stone vs obstruction? 3. Nausea:consider relation to kidney stone vs abdominal pain vs obstruction?  4. Decreased appetite:consider relation to above vs recent abx vs kidney stone 5. 66mm kidney stone  Plan: 1. After discussion with Dr. Ardis Hughs, he recommends proceeding with colonoscopy for further eval of abnormal CT. Will make pt npo after midnight, prep tonight with plans for Dr. Collene Mares to proceed with colonoscopy tomorrow while covering for [place] over the weekend. 2. Continue supportive measures 3. Please await further recs from Dr. Ardis Hughs later today  Thank you for your kind consultation, we will continue to follow.  Lavone Nian [facility]  06/25/2016, 11:46 AM Pager #: [phone]   ________________________________________________________________________  Velora Heckler GI MD note:  I personally examined the patient, reviewed the data and agree with the assessment and plan described above.  CT scan suggest distal sigmoid thickening.  This may be neoplastic and I recommend a colonoscopy to evaluate further. He is pretty frail but I think he will prep well enough. If he has serious difficulties with the prep, will change plans to flexible sigmoidoscopy.   I discussed this with him and his daughter.     Owens Loffler, MD [facility] Gastroenterology Pager [phone]

## 2016-06-25 NOTE — Progress Notes (Signed)
Reviewed patients chart and spoke with primary nurse in ED, patient is not appropriate for observation unit.  Patient is very weak can not ambulate back and forth to restroom.  Requiring total assistance in the emergency department.    Avraj Lindroth RN

## 2016-06-25 NOTE — Consult Note (Signed)
Urology Consult   Physician requesting consult: Dr. Tyrell Antonio  Reason for consult: Right ureteral stone  History of Present Illness: Eduardo Salazar is a 83 y.o. that was noted to have low-grade fever earlier this week along with symptoms of an upper respiratory infection.  He was started on antibiotics for this purpose.  He has had some shortness of breath and wheezing.  Over the next couple of days, he developed symptoms including abdominal pain in the lower abdomen more on the left side compared to the right.  He also developed nausea and vomiting today.  He therefore presented to the hospital for evaluation the emergency room.  He has denied any hematuria or other voiding complaints.  At baseline, he does take tamsulosin 0.8 mg and is followed by Dr. Kathie Rhodes.  He denies a history of voiding or storage urinary symptoms, hematuria, UTIs, STDs, urolithiasis, GU malignancy/trauma/surgery.  Past Medical History:  Diagnosis Date  . BPH (benign prostatic hyperplasia)   . Hx of radiation therapy 01/15/13- 02/13/13   left temple 5000 cGy 20 sessions  . Hypertension   . Myocardial infarction 2014   mild"no symptoms" was told 2 months ago  . skin ca    top of head; xrt comp    Past Surgical History:  Procedure Laterality Date  . APPENDECTOMY    . CATARACT EXTRACTION, BILATERAL    . CYSTOSCOPY W/ URETERAL STENT PLACEMENT  02/2002   on left for ureteral stone  . CYSTOSCOPY WITH RETROGRADE PYELOGRAM, URETEROSCOPY AND STENT PLACEMENT Left 07/31/2012   Procedure: CYSTOSCOPY WITH LEFT RETROGRADE PYELOGRAM, LEFT URETEROSCOPY;  Surgeon: Claybon Jabs, MD;  Location: [facility];  Service: Urology;  Laterality: Left;  . ESOPHAGOGASTRODUODENOSCOPY N/A 09/14/2012   Procedure: ESOPHAGOGASTRODUODENOSCOPY (EGD);  Surgeon: Jerene Bears, MD;  Location: [place];  Service: Gastroenterology;  Laterality: N/A;  . ESOPHAGOGASTRODUODENOSCOPY N/A 11/14/2012   Procedure: ESOPHAGOGASTRODUODENOSCOPY (EGD);   Surgeon: Jerene Bears, MD;  Location: [facility];  Service: Gastroenterology;  Laterality: N/A;  . MOHS SURGERY Left 11/28/12   forehead  . SKIN BIOPSY Left 10/27/12   left forehead near frontal scalp  . VASECTOMY    . WRIST SURGERY     "nerve repair"    Current Hospital Medications:  Home Meds:  Current Meds  Medication Sig  . acetaminophen (TYLENOL) 500 MG tablet Take 2 tablets (1,000 mg total) by mouth every 8 (eight) hours as needed for mild pain, fever or headache. (Patient taking differently: Take 500 mg by mouth every 4 (four) hours as needed for mild pain, fever or headache. )  . carvedilol (COREG) 3.125 MG tablet Take 3.125 mg by mouth daily.   Marland Kitchen docusate sodium (COLACE) 100 MG capsule Take 100 mg by mouth daily.  . famotidine (PEPCID) 20 MG tablet Take 20 mg by mouth at bedtime.  . finasteride (PROSCAR) 5 MG tablet Take 5 mg by mouth daily.   . furosemide (LASIX) 20 MG tablet Take 20 mg by mouth daily.  Marland Kitchen guaifenesin (ROBITUSSIN) 100 MG/5ML syrup Take 200 mg by mouth every 6 (six) hours as needed for cough.  Marland Kitchen levofloxacin (LEVAQUIN) 750 MG tablet Take 750 mg by mouth daily.  . polyvinyl alcohol (ARTIFICIAL TEARS) 1.4 % ophthalmic solution Place 1 drop into both eyes 4 (four) times daily.  Marland Kitchen spironolactone (ALDACTONE) 25 MG tablet Take 25 mg by mouth daily.   . tamsulosin (FLOMAX) 0.4 MG CAPS Take 0.4 mg by mouth 2 (two) times daily.   . Vitamin D,  Ergocalciferol, (DRISDOL) 50000 UNITS CAPS capsule Take 50,000 Units by mouth every 7 (seven) days.     Scheduled Meds: . albuterol  2.5 mg Nebulization Q6H  . carvedilol  3.125 mg Oral Daily  . cefTRIAXone (ROCEPHIN)  IV  1 g Intravenous Q24H  . chlorhexidine  15 mL Mouth Rinse BID  . famotidine  20 mg Oral QHS  . finasteride  5 mg Oral Daily  . heparin  5,000 Units Subcutaneous Q8H  . iopamidol      . mouth rinse  15 mL Mouth Rinse q12n4p  . metronidazole  500 mg Intravenous Q8H  . peg 3350 powder  0.5 kit Oral Once    And  . peg 3350 powder  0.5 kit Oral Once  . tamsulosin  0.4 mg Oral BID   Continuous Infusions: . sodium chloride     PRN Meds:.acetaminophen **OR** acetaminophen, acetaminophen, guaiFENesin, nitroGLYCERIN, ondansetron **OR** ondansetron (ZOFRAN) IV  Allergies: No Known Allergies  Family History  Problem Relation Age of Onset  . Heart attack Father   . Arthritis Mother   . Colon cancer Neg Hx     Social History:  reports that he quit smoking about 42 years ago. His smoking use included Cigarettes. He has a 11.00 pack-year smoking history. He has never used smokeless tobacco. He reports that he does not drink alcohol or use drugs.  ROS: A complete review of systems was performed.  All systems are negative except for pertinent findings as noted.  Physical Exam:  Vital signs in last 24 hours: Temp:  [97.5 F (36.4 C)-97.9 F (36.6 C)] 97.5 F (36.4 C) (04/13 1248) Pulse Rate:  [65-85] 65 (04/13 1248) Resp:  [17-20] 19 (04/13 1145) BP: (94-124)/(58-105) 94/58 (04/13 1248) SpO2:  [94 %-96 %] 96 % (04/13 1248) Constitutional:  Alert and oriented, No acute distress Cardiovascular: Regular rate and rhythm, No JVD Respiratory: bilateral wheezes, poor inspiration effort. GI: He does have moderate tenderness on palpation of his right abdomen.  He has minimal tenderness on the left side. GU: Moderate right CVA tenderness  No left CVA tenderness. Lymphatic: No lymphadenopathy Neurologic: Grossly intact, no focal deficits Psychiatric: Normal mood and affect  Laboratory Data:   Recent Labs  06/25/16 0744  WBC 9.8  HGB 13.4  HCT 39.8  PLT 205     Recent Labs  06/25/16 0744  NA 138  K 3.9  CL 104  GLUCOSE 125*  BUN 23*  CALCIUM 9.0  CREATININE 1.70*     Results for orders placed or performed during the hospital encounter of 06/25/16 (from the past 24 hour(s))  CBC     Status: None   Collection Time: 06/25/16  7:44 AM  Result Value Ref Range   WBC 9.8 4.0 - 10.5  K/uL   RBC 4.58 4.22 - 5.81 MIL/uL   Hemoglobin 13.4 13.0 - 17.0 g/dL   HCT 39.8 39.0 - 52.0 %   MCV 86.9 78.0 - 100.0 fL   MCH 29.3 26.0 - 34.0 pg   MCHC 33.7 30.0 - 36.0 g/dL   RDW 15.0 11.5 - 15.5 %   Platelets 205 150 - 400 K/uL  Comprehensive metabolic panel     Status: Abnormal   Collection Time: 06/25/16  7:44 AM  Result Value Ref Range   Sodium 138 135 - 145 mmol/L   Potassium 3.9 3.5 - 5.1 mmol/L   Chloride 104 101 - 111 mmol/L   CO2 25 22 - 32 mmol/L   Glucose, Bld  125 (H) 65 - 99 mg/dL   BUN 23 (H) 6 - 20 mg/dL   Creatinine, Ser 1.70 (H) 0.61 - 1.24 mg/dL   Calcium 9.0 8.9 - 10.3 mg/dL   Total Protein 7.3 6.5 - 8.1 g/dL   Albumin 3.4 (L) 3.5 - 5.0 g/dL   AST 21 15 - 41 U/L   ALT 17 17 - 63 U/L   Alkaline Phosphatase 100 38 - 126 U/L   Total Bilirubin 0.7 0.3 - 1.2 mg/dL   GFR calc non Af Amer 34 (L) >60 mL/min   GFR calc Af Amer 40 (L) >60 mL/min   Anion gap 9 5 - 15  Lipase, blood     Status: None   Collection Time: 06/25/16  7:44 AM  Result Value Ref Range   Lipase 16 11 - 51 U/L  Influenza panel by PCR (type A & B)     Status: None   Collection Time: 06/25/16  7:44 AM  Result Value Ref Range   Influenza A By PCR NEGATIVE NEGATIVE   Influenza B By PCR NEGATIVE NEGATIVE  Troponin I     Status: None   Collection Time: 06/25/16  7:44 AM  Result Value Ref Range   Troponin I <0.03 <0.03 ng/mL   No results found for this or any previous visit (from the past 240 hour(s)).  Renal Function:  Recent Labs  06/25/16 0744  CREATININE 1.70*   CrCl cannot be calculated (Unknown ideal weight.).  Radiologic Imaging: Dg Chest 2 View  Result Date: 06/25/2016 CLINICAL DATA:  Cough, congestion, recent flu symptoms, weakness EXAM: CHEST  2 VIEW COMPARISON:  03/02/2015 FINDINGS: Cardiomediastinal silhouette is stable. No infiltrate or pulmonary edema. Surgical clips upper paraspinal region are again noted. No infiltrate or pulmonary edema. Mild left basilar atelectasis  or scarring. Degenerative changes thoracic spine. IMPRESSION: No active cardiopulmonary disease. Electronically Signed   By: Lahoma Crocker M.D.   On: 06/25/2016 08:10   Ct Abdomen Pelvis W Contrast  Result Date: 06/25/2016 CLINICAL DATA:  Generalized abdominal pain. EXAM: CT ABDOMEN AND PELVIS WITH CONTRAST TECHNIQUE: Multidetector CT imaging of the abdomen and pelvis was performed using the standard protocol following bolus administration of intravenous contrast. CONTRAST:  54m ISOVUE-300 IOPAMIDOL (ISOVUE-300) INJECTION 61% COMPARISON:  Radiograph same day.  CT scan of March 02, 2015. FINDINGS: Lower chest: Bronchiectasis is noted in the lower lobes bilaterally. No acute pulmonary disease is noted. Hepatobiliary: Liver appears normal. Possible sludge seen in the gallbladder lumen. Pancreas: Unremarkable. No pancreatic ductal dilatation or surrounding inflammatory changes. Spleen: Normal in size without focal abnormality. Adrenals/Urinary Tract: Adrenal glands appear normal. Severe left renal atrophy is noted. Nonobstructive calculus is noted in the left kidney. Right kidney appears normal. No hydronephrosis or right ureteral dilatation is noted. However, there is a 9 mm nonobstructive calculus seen in the distal right ureter. Urinary bladder is unremarkable. Stomach/Bowel: Diverticulosis of descending and sigmoid colon is noted without inflammation. There is no evidence of bowel obstruction. Status post appendectomy. Focal thickening of rectosigmoid junction is noted concerning for possible neoplasm. Vascular/Lymphatic: Aortic atherosclerosis. No enlarged abdominal or pelvic lymph nodes. Reproductive: Prostatic calcifications are noted. Prostate gland appears to be normal in size. Other: Small fat containing left inguinal hernia is noted. Musculoskeletal: No acute or significant osseous findings. IMPRESSION: Focal thickening of rectosigmoid junction is noted concerning for possible neoplasm. Sigmoidoscopy is  recommended for further evaluation. Bronchiectasis is noted in both lower lobes. Probable sludge seen within the gallbladder. Severe left  renal atrophy with nonobstructive calculus. No hydronephrosis or ureteral dilatation is noted. However, 9 mm nonobstructive calculus is seen in the distal right ureter. Diverticulosis of descending and sigmoid colon is noted without inflammation. Aortic atherosclerosis. Electronically Signed   By: Marijo Conception, M.D.   On: 06/25/2016 10:35   Dg Abd 2 Views  Result Date: 06/25/2016 CLINICAL DATA:  Acute generalized abdominal pain. EXAM: ABDOMEN - 2 VIEW COMPARISON:  Radiographs of May 26, 2014. FINDINGS: Mildly dilated small bowel loops are noted in the right lower quadrant which may represent ileus or possibly obstruction. No colonic dilatation is noted. Mild amount of stool is noted in the colon. No free air is noted. No definite calculi are noted. IMPRESSION: Mildly dilated small bowel loops are noted in the right lower quadrant which may represent ileus or possibly small bowel obstruction. Follow-up radiographs are recommended. Electronically Signed   By: Marijo Conception, M.D.   On: 06/25/2016 08:11    I independently reviewed the above imaging studies.  Impression/Recommendation Right ureteral calculus:There is concern that he may have multiple etiologies for his symptoms.  With regard to his fever and respiratory symptoms, it appears that he may have been diagnosed with a viral illness earlier this week.  His chest x-ray does not demonstrate objective evidence of pneumonia.  Furthermore, he has findings in his rectosigmoid junction that may be concerning for possible infection/nflammatory process versus malignancy.  Interestingly, he had mostly complained of left-sidedabdominal pain but his exam suggests that he is more tender on the right side today.  He does have a 9 mm distal right ureteral calculus  The likelihood that he will pass this spontaneously is  low but possible.  He is already on alpha blocker therapy.  We discussed options including surgical intervention with ureteroscopy or even just ureteral stent placement.  His urinalysis is currently pending to assess his risk for infected urine.  After reviewing options, he and his daughter would like to proceed with observation at least over the next 24 hours considering the risk of undergoing general anesthesia considering what appears to be a recent upper respiratory infection and considering his advanced age.  This would seem reasonable and the absence of obvious urinary tract infection or systemic signs of worsening infection.  I will plan to reevaluate him.  His urine should be strained in the meantime.  If he does demonstrate systemic signs of infection such as fever or has worsening pain that is uncontrolled, we likely will proceed with proactive intervention with ureteral stent placement.  I also will assess his urinalysis once returned to determine whether stent placement may be recommended at this time.  Reda Gettis,LES 06/25/2016, 1:43 PM    Pryor Curia MD   CC: Dr. Tyrell Antonio

## 2016-06-25 NOTE — H&P (Signed)
History and Physical    [name]:[id number] DOB: 06/25/1925 DOA: 06/25/2016  PCP: [name], MD Patient coming from: [facility] facility I have personally briefly reviewed patient's old medical records in [place]  Chief Complaint: Nausea, abdominal pain  HPI: Eduardo Salazar is a 83 y.o. male with medical history significant of BPH, hypertension who presents complaining of nausea, and abdominal pain in the left lower quadrant. Patient reports a viral  Illness 4 days prior to admission prior to admission with sore throat, cough. He was evaluated by his PCP and he was started on antibiotic, a chest x ray was obtained and he was negative for pneumonia. He presents today with nausea and abdominal pain, he doesn't recall when the pain started. He denies diarrhea, last bowel movement was 2 days prior to admission. He had a fever earlier this week.   ED Course: Cr at 1.7, hb 13.4, influenza negative, Chest x ray; no active cardiopulmonary diseases. CT abdomen pelvis; Focal thickening of rectosigmoid junction is noted concerning for possible neoplasm. Sigmoidoscopy is recommended for further evaluation. Probable sludge seen within the gallbladder. Severe left renal atrophy with nonobstructive calculus. No hydronephrosis or ureteral dilatation is noted. However, 9 mm nonobstructive calculus is seen in the distal right ureter.  Review of Systems: As per HPI otherwise 10 point review of systems negative.    Past Medical History:  Diagnosis Date  . BPH (benign prostatic hyperplasia)   . Hx of radiation therapy 01/15/13- 02/13/13   left temple 5000 cGy 20 sessions  . Hypertension   . Myocardial infarction 2014   mild"no symptoms" was told 2 months ago  . skin ca    top of head; xrt comp    Past Surgical History:  Procedure Laterality Date  . APPENDECTOMY    . CATARACT EXTRACTION, BILATERAL    . CYSTOSCOPY W/ URETERAL STENT PLACEMENT  02/2002   on left for ureteral stone    . CYSTOSCOPY WITH RETROGRADE PYELOGRAM, URETEROSCOPY AND STENT PLACEMENT Left 07/31/2012   Procedure: CYSTOSCOPY WITH LEFT RETROGRADE PYELOGRAM, LEFT URETEROSCOPY;  Surgeon: Claybon Jabs, MD;  Location: [facility];  Service: Urology;  Laterality: Left;  . ESOPHAGOGASTRODUODENOSCOPY N/A 09/14/2012   Procedure: ESOPHAGOGASTRODUODENOSCOPY (EGD);  Surgeon: Jerene Bears, MD;  Location: [facility];  Service: Gastroenterology;  Laterality: N/A;  . ESOPHAGOGASTRODUODENOSCOPY N/A 11/14/2012   Procedure: ESOPHAGOGASTRODUODENOSCOPY (EGD);  Surgeon: Jerene Bears, MD;  Location: [facility];  Service: Gastroenterology;  Laterality: N/A;  . MOHS SURGERY Left 11/28/12   forehead  . SKIN BIOPSY Left 10/27/12   left forehead near frontal scalp  . VASECTOMY    . WRIST SURGERY     "nerve repair"     reports that he quit smoking about 42 years ago. His smoking use included Cigarettes. He has a 11.00 pack-year smoking history. He has never used smokeless tobacco. He reports that he does not drink alcohol or use drugs.  No Known Allergies  Family History  Problem Relation Age of Onset  . Heart attack Father   . Arthritis Mother   . Colon cancer Neg Hx      Prior to Admission medications   Medication Sig Start Date End Date Taking? Authorizing Provider  acetaminophen (TYLENOL) 500 MG tablet Take 2 tablets (1,000 mg total) by mouth every 8 (eight) hours as needed for mild pain, fever or headache. Patient taking differently: Take 500 mg by mouth every 4 (four) hours as needed for mild pain, fever or headache.  05/28/14  Yes [name], MD  carvedilol (COREG) 3.125 MG tablet Take 3.125 mg by mouth daily.    Yes Historical Provider, MD  famotidine (PEPCID) 20 MG tablet Take 20 mg by mouth at bedtime. 06/11/16  Yes Historical Provider, MD  finasteride (PROSCAR) 5 MG tablet Take 5 mg by mouth daily.  11/02/12  Yes Historical Provider, MD  furosemide (LASIX) 20 MG tablet Take 20 mg by mouth daily. 06/23/16  Yes  Historical Provider, MD  levofloxacin (LEVAQUIN) 750 MG tablet Take 750 mg by mouth daily. 06/21/16  Yes Historical Provider, MD  polyvinyl alcohol (ARTIFICIAL TEARS) 1.4 % ophthalmic solution Place 1 drop into both eyes 4 (four) times daily.   Yes Historical Provider, MD  ciprofloxacin (CIPRO) 500 MG tablet Take 1 tablet (500 mg total) by mouth 2 (two) times daily. Patient not taking: Reported on 06/25/2016 03/06/15   [name], MD  neomycin-bacitracin-polymyxin (NEOSPORIN) [phone] ointment Apply 1 application topically as needed (for minor skin tears or abrasions).     Historical Provider, MD  nitroGLYCERIN (NITROSTAT) 0.4 MG SL tablet Place 0.4 mg under the tongue every 5 (five) minutes as needed for chest pain.    Historical Provider, MD  pantoprazole (PROTONIX) 40 MG tablet Take 1 tablet (40 mg total) by mouth daily. Patient not taking: Reported on 06/25/2016 05/28/14   [name], MD  polyethylene glycol [facility] / [name]) packet Take 17 g by mouth daily. Patient not taking: Reported on 06/25/2016 05/28/14   [name], MD  spironolactone (ALDACTONE) 25 MG tablet Take 25 mg by mouth daily.  04/15/14   Historical Provider, MD  tamsulosin (FLOMAX) 0.4 MG CAPS Take 0.4 mg by mouth daily.     Historical Provider, MD  Vitamin D, Ergocalciferol, (DRISDOL) 50000 UNITS CAPS capsule Take 50,000 Units by mouth every Wednesday.  02/13/15   Historical Provider, MD    Physical Exam: Vitals:   06/25/16 0649 06/25/16 1026  BP: (!) 124/105 107/60  Pulse: 85 83  Resp: 20 17  Temp: 97.9 F (36.6 C)   TempSrc: Oral   SpO2: 95% 94%    Constitutional: NAD, calm, comfortable Vitals:   06/25/16 0649 06/25/16 1026  BP: (!) 124/105 107/60  Pulse: 85 83  Resp: 20 17  Temp: 97.9 F (36.6 C)   TempSrc: Oral   SpO2: 95% 94%   Eyes: PERRL, lids and conjunctivae normal ENMT: Mucous membranes are moist. Posterior pharynx clear of any exudate or lesions.Normal dentition.  Neck: normal, supple,  no masses, no thyromegaly Respiratory: Bilateral wheezing, no crackles. Normal respiratory effort. No accessory muscle use.  Cardiovascular: Regular rate and rhythm, no murmurs / rubs / gallops. No extremity edema. 2+ pedal pulses. No carotid bruits.  Abdomen: Left lower quadrant  tenderness, mild distended , no masses palpated. No hepatosplenomegaly. Bowel sounds positive.  Musculoskeletal: no clubbing / cyanosis. No joint deformity upper and lower extremities. Good ROM, no contractures. Normal muscle tone. Tender on palpation on the right lower extremity Skin: no rashes, lesions, ulcers. No induration Neurologic: CN 2-12 grossly intact. Sensation intact, DTR normal. Strength 5/5 in all 4.  Psychiatric: Normal judgment and insight. Alert and oriented x 3. Normal mood.     Labs on Admission: I have personally reviewed following labs and imaging studies  CBC:  Recent Labs Lab 06/25/16 0744  WBC 9.8  HGB 13.4  HCT 39.8  MCV 86.9  PLT 509   Basic Metabolic Panel:  Recent Labs Lab 06/25/16 0744  NA 138  K 3.9  CL 104  CO2 25  GLUCOSE 125*  BUN 23*  CREATININE 1.70*  CALCIUM 9.0   GFR: CrCl cannot be calculated (Unknown ideal weight.). Liver Function Tests:  Recent Labs Lab 06/25/16 0744  AST 21  ALT 17  ALKPHOS 100  BILITOT 0.7  PROT 7.3  ALBUMIN 3.4*    Recent Labs Lab 06/25/16 0744  LIPASE 16   No results for input(s): AMMONIA in the last 168 hours. Coagulation Profile: No results for input(s): INR, PROTIME in the last 168 hours. Cardiac Enzymes:  Recent Labs Lab 06/25/16 0744  TROPONINI <0.03   BNP (last 3 results) No results for input(s): PROBNP in the last 8760 hours. HbA1C: No results for input(s): HGBA1C in the last 72 hours. CBG: No results for input(s): GLUCAP in the last 168 hours. Lipid Profile: No results for input(s): CHOL, HDL, LDLCALC, TRIG, CHOLHDL, LDLDIRECT in the last 72 hours. Thyroid Function Tests: No results for input(s):  TSH, T4TOTAL, FREET4, T3FREE, THYROIDAB in the last 72 hours. Anemia Panel: No results for input(s): VITAMINB12, FOLATE, FERRITIN, TIBC, IRON, RETICCTPCT in the last 72 hours. Urine analysis:    Component Value Date/Time   COLORURINE YELLOW 03/05/2015 0816   APPEARANCEUR TURBID (A) 03/05/2015 0816   LABSPEC 1.011 03/05/2015 0816   PHURINE 8.0 03/05/2015 0816   GLUCOSEU NEGATIVE 03/05/2015 0816   HGBUR LARGE (A) 03/05/2015 0816   BILIRUBINUR NEGATIVE 03/05/2015 0816   KETONESUR NEGATIVE 03/05/2015 0816   PROTEINUR 100 (A) 03/05/2015 0816   UROBILINOGEN 1.0 05/26/2014 1132   NITRITE NEGATIVE 03/05/2015 0816   LEUKOCYTESUR LARGE (A) 03/05/2015 0816    Radiological Exams on Admission: Dg Chest 2 View  Result Date: 06/25/2016 CLINICAL DATA:  Cough, congestion, recent flu symptoms, weakness EXAM: CHEST  2 VIEW COMPARISON:  03/02/2015 FINDINGS: Cardiomediastinal silhouette is stable. No infiltrate or pulmonary edema. Surgical clips upper paraspinal region are again noted. No infiltrate or pulmonary edema. Mild left basilar atelectasis or scarring. Degenerative changes thoracic spine. IMPRESSION: No active cardiopulmonary disease. Electronically Signed   By: Lahoma Crocker M.D.   On: 06/25/2016 08:10   Ct Abdomen Pelvis W Contrast  Result Date: 06/25/2016 CLINICAL DATA:  Generalized abdominal pain. EXAM: CT ABDOMEN AND PELVIS WITH CONTRAST TECHNIQUE: Multidetector CT imaging of the abdomen and pelvis was performed using the standard protocol following bolus administration of intravenous contrast. CONTRAST:  59mL ISOVUE-300 IOPAMIDOL (ISOVUE-300) INJECTION 61% COMPARISON:  Radiograph same day.  CT scan of March 02, 2015. FINDINGS: Lower chest: Bronchiectasis is noted in the lower lobes bilaterally. No acute pulmonary disease is noted. Hepatobiliary: Liver appears normal. Possible sludge seen in the gallbladder lumen. Pancreas: Unremarkable. No pancreatic ductal dilatation or surrounding inflammatory  changes. Spleen: Normal in size without focal abnormality. Adrenals/Urinary Tract: Adrenal glands appear normal. Severe left renal atrophy is noted. Nonobstructive calculus is noted in the left kidney. Right kidney appears normal. No hydronephrosis or right ureteral dilatation is noted. However, there is a 9 mm nonobstructive calculus seen in the distal right ureter. Urinary bladder is unremarkable. Stomach/Bowel: Diverticulosis of descending and sigmoid colon is noted without inflammation. There is no evidence of bowel obstruction. Status post appendectomy. Focal thickening of rectosigmoid junction is noted concerning for possible neoplasm. Vascular/Lymphatic: Aortic atherosclerosis. No enlarged abdominal or pelvic lymph nodes. Reproductive: Prostatic calcifications are noted. Prostate gland appears to be normal in size. Other: Small fat containing left inguinal hernia is noted. Musculoskeletal: No acute or significant osseous findings. IMPRESSION: Focal thickening of rectosigmoid junction  is noted concerning for possible neoplasm. Sigmoidoscopy is recommended for further evaluation. Bronchiectasis is noted in both lower lobes. Probable sludge seen within the gallbladder. Severe left renal atrophy with nonobstructive calculus. No hydronephrosis or ureteral dilatation is noted. However, 9 mm nonobstructive calculus is seen in the distal right ureter. Diverticulosis of descending and sigmoid colon is noted without inflammation. Aortic atherosclerosis. Electronically Signed   By: Marijo Conception, M.D.   On: 06/25/2016 10:35   Dg Abd 2 Views  Result Date: 06/25/2016 CLINICAL DATA:  Acute generalized abdominal pain. EXAM: ABDOMEN - 2 VIEW COMPARISON:  Radiographs of May 26, 2014. FINDINGS: Mildly dilated small bowel loops are noted in the right lower quadrant which may represent ileus or possibly obstruction. No colonic dilatation is noted. Mild amount of stool is noted in the colon. No free air is noted. No  definite calculi are noted. IMPRESSION: Mildly dilated small bowel loops are noted in the right lower quadrant which may represent ileus or possibly small bowel obstruction. Follow-up radiographs are recommended. Electronically Signed   By: Marijo Conception, M.D.   On: 06/25/2016 08:11    EKG: Independently reviewed. Sinus rhythm,.   Assessment/Plan Active Problems:   CKD (chronic kidney disease) stage 3, GFR 30-59 ml/min   AKI (acute kidney injury) ([place])   Bronchitis   Abdominal pain  1-AKI on CKD stage 3;  Likely related to hypovolemia, poor oral intake.  Will hold diuretics.  Continue with IV fluids.  Repeat labs in am.   2-Abdominal pain; Nausea.  CT with rectosigmoid thickening. Received Antibiotics outpatient. Will cover for infectious porcess with IV flagyl.  NPO in case required urologic procedure.  GI consulted for further evaluation.   3-HTN;  Continue with coreg.  Hold diuretics lasix and spironolactone.   4-9 mm Non-obstructive distal right ureter;  Will get UA, culture.  Start ceftriaxone.  Might explain symptoms also.  Urology consulted.   5-Bronchitis;  Start Nebulizer.     DVT prophylaxis: Heparin Code Status: Full Code Family Communication: Daughter who was at bedside Disposition Plan: Suspect he will be able to go back to ALF in 24 to 48 hours.  Consults called: GI Admission status; Observation, medical floor.    Elmarie Shiley MD [facility] Pager [phone]  If 7PM-7AM, please contact night-coverage [email or web address] Password [email or web address]  06/25/2016, 11:37 AM

## 2016-06-25 NOTE — ED Notes (Signed)
Patient transported to CT 

## 2016-06-25 NOTE — ED Notes (Signed)
PT down in bed attempt to vomit. assist PT up in bed. Lift head of bed up

## 2016-06-25 NOTE — ED Notes (Signed)
Patient transported to X-ray 

## 2016-06-25 NOTE — ED Notes (Signed)
Per EMS- From [place], complaining of increase in chronic weakness. Feels as if he is going to fall. Endorsing right upper quadrant pain. Denies N/V/D.

## 2016-06-25 NOTE — ED Notes (Signed)
Hospitalist MD at bedside. 

## 2016-06-25 NOTE — Clinical Social Work Note (Signed)
Clinical Social Work Assessment  Patient Details  Name: Eduardo Salazar MRN: [id number] Date of Birth: September 18, 1925  Date of referral:  06/25/16               Reason for consult:  Facility Placement                Permission sought to share information with:  Chartered certified accountant granted to share information::  No (Pt allowed CSW to speak to [facility] to facilitate return)  Name::        Agency::     Relationship::     Contact Information:     Housing/Transportation Living arrangements for the past 2 months:  [place] of Information:  Patient, Adult Children Patient Interpreter Needed:  None Criminal Activity/Legal Involvement Pertinent to Current Situation/Hospitalization:    Significant Relationships:  Adult Children, Spouse Lives with:  Facility Resident Do you feel safe going back to the place where you live?  Yes Need for family participation in patient care:  No (Coment) (Pt's daughter would like to be present, if possible, but pt makes his own decisions)  Care giving concerns:  Pt will refuse SNF due to his wife living at [place] with him. Pt refused when CSW spoke to pt.  Pt's daughter's stated they will attempt to convince pt.   Social Worker assessment / plan:  CSW met with pt and confirmed pt's plan to be discharged back to [facility]F to live at discharge.  CSW provided active listening and validated pt's daughter's concerns.   Pt did not give CSW Dept to complete FL-2 and send referrals out to SNF facilities via the hub at this time.  Pt has been living at [facility] with his wife prior to being admitted to [facility].   Employment status:  Retired Health visitor, Managed Care PT Recommendations:  Not assessed at this time Information / Referral to community resources:     Patient/Family's Response to care:  Patient alert and oriented.  Patient agreeable to plan.  Pt's daughters  supportive and strongly involved in pt.'s care.  Pt  pleasant and appreciated CSW intervention.    Patient/Family's Understanding of and Emotional Response to Diagnosis, Current Treatment, and Prognosis:  Still assessing  Emotional Assessment Appearance:  Appears stated age Attitude/Demeanor/Rapport:    Affect (typically observed):  Stoic, Appropriate, Blunt, Calm (Hard of Hearing) Orientation:  Oriented to Self, Oriented to Place, Oriented to  Time, Oriented to Situation Alcohol / Substance use:    Psych involvement (Current and /or in the community):     Discharge Needs  Concerns to be addressed:  No discharge needs identified Readmission within the last 30 days:  No Current discharge risk:  None Barriers to Discharge:  No Barriers Identified   Claudine Mouton, LCSWA 06/25/2016, 10:16 PM

## 2016-06-25 NOTE — ED Notes (Signed)
Called floor to give report, RN unavailable at this time. Awaiting a call back.  

## 2016-06-25 NOTE — ED Notes (Signed)
Patient c/o congestion in throat.

## 2016-06-25 NOTE — ED Notes (Signed)
Pt is stating that he "just doesn't feel well". He states he has had difficulty ambulating.

## 2016-06-25 NOTE — ED Notes (Signed)
Bed: YK59 Expected date:  Expected time:  Means of arrival:  Comments: Generalized pain/weakness

## 2016-06-25 NOTE — ED Provider Notes (Signed)
[facility] DEPT Provider Note   CSN: [id number] Arrival date & time: 06/25/16  2505     History   Chief Complaint Chief Complaint  Patient presents with  . Weakness  . Abdominal Pain    HPI Eduardo Salazar is a 83 y.o. male.  HPI Patient presents the emergency department with increasing generalized weakness over the past 3 days with associated nausea.  Denies vomiting.  Denies diarrhea.  He reports generalized abdominal discomfort and pain and abdominal swelling.  Family reports decreased oral intake this week and increasing weakness.  Patient's had difficulty walking secondary to weakness.  He feels like he is going to fall.  He complains of lightheadedness.  No melena or hematochezia.  Reports new productive cough.  Denies fevers and chills.   Past Medical History:  Diagnosis Date  . BPH (benign prostatic hyperplasia)   . Hx of radiation therapy 01/15/13- 02/13/13   left temple 5000 cGy 20 sessions  . Hypertension   . Myocardial infarction 2014   mild"no symptoms" was told 2 months ago  . skin ca    top of head; xrt comp    Patient Active Problem List   Diagnosis Date Noted  . UTI (lower urinary tract infection) 03/05/2015  . Coronary artery disease involving native heart without angina pectoris 03/05/2015  . Benign essential HTN 03/05/2015  . Leukocytosis 03/05/2015  . T12 compression fracture ([place]) 05/26/2014  . BPH (benign prostatic hyperplasia) 05/26/2014  . Squamous cell carcinoma of skin of other and unspecified parts of face 10/27/2012  . CKD (chronic kidney disease) stage 3, GFR 30-59 ml/min 09/13/2012  . Pyloric channel ulcer 09/12/2012    Past Surgical History:  Procedure Laterality Date  . APPENDECTOMY    . CATARACT EXTRACTION, BILATERAL    . CYSTOSCOPY W/ URETERAL STENT PLACEMENT  02/2002   on left for ureteral stone  . CYSTOSCOPY WITH RETROGRADE PYELOGRAM, URETEROSCOPY AND STENT PLACEMENT Left 07/31/2012   Procedure: CYSTOSCOPY WITH LEFT  RETROGRADE PYELOGRAM, LEFT URETEROSCOPY;  Surgeon: Claybon Jabs, MD;  Location: [facility];  Service: Urology;  Laterality: Left;  . ESOPHAGOGASTRODUODENOSCOPY N/A 09/14/2012   Procedure: ESOPHAGOGASTRODUODENOSCOPY (EGD);  Surgeon: Jerene Bears, MD;  Location: [facility];  Service: Gastroenterology;  Laterality: N/A;  . ESOPHAGOGASTRODUODENOSCOPY N/A 11/14/2012   Procedure: ESOPHAGOGASTRODUODENOSCOPY (EGD);  Surgeon: Jerene Bears, MD;  Location: [facility];  Service: Gastroenterology;  Laterality: N/A;  . MOHS SURGERY Left 11/28/12   forehead  . SKIN BIOPSY Left 10/27/12   left forehead near frontal scalp  . VASECTOMY    . WRIST SURGERY     "nerve repair"       Home Medications    Prior to Admission medications   Medication Sig Start Date End Date Taking? Authorizing Provider  famotidine (PEPCID) 20 MG tablet Take 20 mg by mouth at bedtime. 06/11/16  Yes Historical Provider, MD  furosemide (LASIX) 20 MG tablet Take 20 mg by mouth daily. 06/23/16  Yes Historical Provider, MD  levofloxacin (LEVAQUIN) 750 MG tablet Take 750 mg by mouth daily. 06/21/16  Yes Historical Provider, MD  acetaminophen (TYLENOL) 500 MG tablet Take 2 tablets (1,000 mg total) by mouth every 8 (eight) hours as needed for mild pain, fever or headache. Patient taking differently: Take 500 mg by mouth every 4 (four) hours as needed for mild pain, fever or headache.  05/28/14   [name], MD  carvedilol (COREG) 3.125 MG tablet Take 3.125 mg by mouth daily.     Historical  Provider, MD  ciprofloxacin (CIPRO) 500 MG tablet Take 1 tablet (500 mg total) by mouth 2 (two) times daily. 03/06/15   [name], MD  finasteride (PROSCAR) 5 MG tablet Take 5 mg by mouth daily.  11/02/12   Historical Provider, MD  loperamide (IMODIUM) 2 MG capsule Take 2 mg by mouth as needed for diarrhea or loose stools. Do not exceed 8 doses in 24 hours    Historical Provider, MD  neomycin-bacitracin-polymyxin (NEOSPORIN) [phone] ointment Apply 1  application topically as needed (for minor skin tears or abrasions).     Historical Provider, MD  nitroGLYCERIN (NITROSTAT) 0.4 MG SL tablet Place 0.4 mg under the tongue every 5 (five) minutes as needed for chest pain.    Historical Provider, MD  pantoprazole (PROTONIX) 40 MG tablet Take 1 tablet (40 mg total) by mouth daily. Patient taking differently: Take 40 mg by mouth daily before breakfast.  05/28/14   [name], MD  polyethylene glycol [facility] / GLYCOLAX) packet Take 17 g by mouth daily. Patient taking differently: Take 17 g by mouth daily as needed (constipation).  05/28/14   [name], MD  spironolactone (ALDACTONE) 25 MG tablet Take 25 mg by mouth daily.  04/15/14   Historical Provider, MD  tamsulosin (FLOMAX) 0.4 MG CAPS Take 0.4 mg by mouth daily.     Historical Provider, MD  Vitamin D, Ergocalciferol, (DRISDOL) 50000 UNITS CAPS capsule Take 50,000 Units by mouth every Wednesday.  02/13/15   Historical Provider, MD    Family History Family History  Problem Relation Age of Onset  . Heart attack Father   . Arthritis Mother   . Colon cancer Neg Hx     Social History Social History  Substance Use Topics  . Smoking status: Former Smoker    Packs/day: 0.50    Years: 22.00    Types: Cigarettes    Quit date: 07/27/1973  . Smokeless tobacco: Never Used  . Alcohol use No     Allergies   Patient has no known allergies.   Review of Systems Review of Systems  All other systems reviewed and are negative.    Physical Exam Updated Vital Signs BP 107/60   Pulse 83   Temp 97.9 F (36.6 C) (Oral)   Resp 17   SpO2 94%   Physical Exam  Constitutional: He is oriented to person, place, and time. He appears well-developed and well-nourished.  HENT:  Head: Normocephalic and atraumatic.  Dry mucous membranes  Eyes: EOM are normal.  Neck: Normal range of motion.  Cardiovascular: Normal rate, regular rhythm, normal heart sounds and intact distal pulses.     Pulmonary/Chest: Effort normal and breath sounds normal. No respiratory distress.  Abdominal: Soft. He exhibits no distension. There is no tenderness.  Musculoskeletal: Normal range of motion.  Full range of motion of joints of bilateral upper lower extremities  Neurological: He is alert and oriented to person, place, and time.  Skin: Skin is warm and dry.  Psychiatric: He has a normal mood and affect. Judgment normal.  Nursing note and vitals reviewed.    ED Treatments / Results  Labs (all labs ordered are listed, but only abnormal results are displayed) Labs Reviewed  COMPREHENSIVE METABOLIC PANEL - Abnormal; Notable for the following:       Result Value   Glucose, Bld 125 (*)    BUN 23 (*)    Creatinine, Ser 1.70 (*)    Albumin 3.4 (*)    GFR calc non Af  Amer 34 (*)    GFR calc Af Amer 40 (*)    All other components within normal limits  CBC  LIPASE, BLOOD  INFLUENZA PANEL BY PCR (TYPE A & B)  TROPONIN I    EKG  EKG Interpretation  Date/Time:  Friday June 25 2016 07:24:08 EDT Ventricular Rate:  69 PR Interval:    QRS Duration: 111 QT Interval:  443 QTC Calculation: 475 R Axis:   4 Text Interpretation:  Sinus rhythm Borderline low voltage, extremity leads Abnormal R-wave progression, early transition No significant change was found Confirmed by Jeffre Enriques  MD, Lennette Bihari ([id number]) on 06/25/2016 8:15:43 AM       Radiology Dg Chest 2 View  Result Date: 06/25/2016 CLINICAL DATA:  Cough, congestion, recent flu symptoms, weakness EXAM: CHEST  2 VIEW COMPARISON:  03/02/2015 FINDINGS: Cardiomediastinal silhouette is stable. No infiltrate or pulmonary edema. Surgical clips upper paraspinal region are again noted. No infiltrate or pulmonary edema. Mild left basilar atelectasis or scarring. Degenerative changes thoracic spine. IMPRESSION: No active cardiopulmonary disease. Electronically Signed   By: Lahoma Crocker M.D.   On: 06/25/2016 08:10   Ct Abdomen Pelvis W Contrast  Result  Date: 06/25/2016 CLINICAL DATA:  Generalized abdominal pain. EXAM: CT ABDOMEN AND PELVIS WITH CONTRAST TECHNIQUE: Multidetector CT imaging of the abdomen and pelvis was performed using the standard protocol following bolus administration of intravenous contrast. CONTRAST:  4mL ISOVUE-300 IOPAMIDOL (ISOVUE-300) INJECTION 61% COMPARISON:  Radiograph same day.  CT scan of March 02, 2015. FINDINGS: Lower chest: Bronchiectasis is noted in the lower lobes bilaterally. No acute pulmonary disease is noted. Hepatobiliary: Liver appears normal. Possible sludge seen in the gallbladder lumen. Pancreas: Unremarkable. No pancreatic ductal dilatation or surrounding inflammatory changes. Spleen: Normal in size without focal abnormality. Adrenals/Urinary Tract: Adrenal glands appear normal. Severe left renal atrophy is noted. Nonobstructive calculus is noted in the left kidney. Right kidney appears normal. No hydronephrosis or right ureteral dilatation is noted. However, there is a 9 mm nonobstructive calculus seen in the distal right ureter. Urinary bladder is unremarkable. Stomach/Bowel: Diverticulosis of descending and sigmoid colon is noted without inflammation. There is no evidence of bowel obstruction. Status post appendectomy. Focal thickening of rectosigmoid junction is noted concerning for possible neoplasm. Vascular/Lymphatic: Aortic atherosclerosis. No enlarged abdominal or pelvic lymph nodes. Reproductive: Prostatic calcifications are noted. Prostate gland appears to be normal in size. Other: Small fat containing left inguinal hernia is noted. Musculoskeletal: No acute or significant osseous findings. IMPRESSION: Focal thickening of rectosigmoid junction is noted concerning for possible neoplasm. Sigmoidoscopy is recommended for further evaluation. Bronchiectasis is noted in both lower lobes. Probable sludge seen within the gallbladder. Severe left renal atrophy with nonobstructive calculus. No hydronephrosis or  ureteral dilatation is noted. However, 9 mm nonobstructive calculus is seen in the distal right ureter. Diverticulosis of descending and sigmoid colon is noted without inflammation. Aortic atherosclerosis. Electronically Signed   By: Marijo Conception, M.D.   On: 06/25/2016 10:35   Dg Abd 2 Views  Result Date: 06/25/2016 CLINICAL DATA:  Acute generalized abdominal pain. EXAM: ABDOMEN - 2 VIEW COMPARISON:  Radiographs of May 26, 2014. FINDINGS: Mildly dilated small bowel loops are noted in the right lower quadrant which may represent ileus or possibly obstruction. No colonic dilatation is noted. Mild amount of stool is noted in the colon. No free air is noted. No definite calculi are noted. IMPRESSION: Mildly dilated small bowel loops are noted in the right lower quadrant which may represent ileus  or possibly small bowel obstruction. Follow-up radiographs are recommended. Electronically Signed   By: Marijo Conception, M.D.   On: 06/25/2016 08:11    Procedures Procedures (including critical care time)  Medications Ordered in ED Medications  0.9 %  sodium chloride infusion (0 mLs Intravenous Stopped 06/25/16 0956)    Followed by  0.9 %  sodium chloride infusion (1,000 mLs Intravenous New Bag/Given 06/25/16 0747)  iopamidol (ISOVUE-300) 61 % injection (not administered)  ondansetron (ZOFRAN) injection 4 mg (4 mg Intravenous Given 06/25/16 0749)  iopamidol (ISOVUE-300) 61 % injection 30 mL (30 mLs Oral Contrast Given 06/25/16 0839)  iopamidol (ISOVUE-300) 61 % injection 75 mL (75 mLs Intravenous Contrast Given 06/25/16 1008)     Initial Impression / Assessment and Plan / ED Course  I have reviewed the triage vital signs and the nursing notes.  Pertinent labs & imaging results that were available during my care of the patient were reviewed by me and considered in my medical decision making (see chart for details).     Patient appears dehydrated.  Mild acute kidney injury.  Patient benefited from  observational stay with IV fluids and evaluation by physical therapy to help on strengthening.  No clear etiology for his symptoms found.  Could be viral.  He does have noted thickening of his rectosigmoid junction which will need direct evaluation by GI likely as an outpatient.  Final Clinical Impressions(s) / ED Diagnoses   Final diagnoses:  Nausea  Weakness  Dehydration    New Prescriptions New Prescriptions   No medications on file     Jola Schmidt, MD 06/25/16 1042

## 2016-06-26 ENCOUNTER — Observation Stay (HOSPITAL_COMMUNITY): Payer: Medicare Other | Admitting: Certified Registered"

## 2016-06-26 ENCOUNTER — Observation Stay (HOSPITAL_COMMUNITY): Payer: Medicare Other

## 2016-06-26 ENCOUNTER — Encounter (HOSPITAL_COMMUNITY)
Admission: EM | Disposition: A | Discharge status: Home Health | Payer: Self-pay | Source: Skilled Nursing Facility | Attending: Internal Medicine

## 2016-06-26 ENCOUNTER — Encounter (HOSPITAL_COMMUNITY): Payer: Self-pay

## 2016-06-26 DIAGNOSIS — Z923 Personal history of irradiation: Secondary | ICD-10-CM | POA: Diagnosis not present

## 2016-06-26 DIAGNOSIS — Z87891 Personal history of nicotine dependence: Secondary | ICD-10-CM | POA: Diagnosis not present

## 2016-06-26 DIAGNOSIS — I252 Old myocardial infarction: Secondary | ICD-10-CM | POA: Diagnosis not present

## 2016-06-26 DIAGNOSIS — N183 Chronic kidney disease, stage 3 unspecified: Secondary | ICD-10-CM

## 2016-06-26 DIAGNOSIS — Z8711 Personal history of peptic ulcer disease: Secondary | ICD-10-CM | POA: Diagnosis not present

## 2016-06-26 DIAGNOSIS — Z79899 Other long term (current) drug therapy: Secondary | ICD-10-CM | POA: Diagnosis not present

## 2016-06-26 DIAGNOSIS — I10 Essential (primary) hypertension: Secondary | ICD-10-CM | POA: Diagnosis not present

## 2016-06-26 DIAGNOSIS — J40 Bronchitis, not specified as acute or chronic: Secondary | ICD-10-CM | POA: Diagnosis not present

## 2016-06-26 DIAGNOSIS — R1032 Left lower quadrant pain: Secondary | ICD-10-CM | POA: Diagnosis present

## 2016-06-26 DIAGNOSIS — M79609 Pain in unspecified limb: Secondary | ICD-10-CM | POA: Diagnosis not present

## 2016-06-26 DIAGNOSIS — N201 Calculus of ureter: Secondary | ICD-10-CM | POA: Diagnosis present

## 2016-06-26 DIAGNOSIS — R600 Localized edema: Secondary | ICD-10-CM | POA: Diagnosis present

## 2016-06-26 DIAGNOSIS — K5900 Constipation, unspecified: Secondary | ICD-10-CM | POA: Diagnosis present

## 2016-06-26 DIAGNOSIS — Z6830 Body mass index (BMI) 30.0-30.9, adult: Secondary | ICD-10-CM | POA: Diagnosis not present

## 2016-06-26 DIAGNOSIS — L8990 Pressure ulcer of unspecified site, unspecified stage: Secondary | ICD-10-CM | POA: Insufficient documentation

## 2016-06-26 DIAGNOSIS — D120 Benign neoplasm of cecum: Secondary | ICD-10-CM | POA: Diagnosis present

## 2016-06-26 DIAGNOSIS — E860 Dehydration: Secondary | ICD-10-CM

## 2016-06-26 DIAGNOSIS — M7989 Other specified soft tissue disorders: Secondary | ICD-10-CM | POA: Diagnosis not present

## 2016-06-26 DIAGNOSIS — K635 Polyp of colon: Secondary | ICD-10-CM | POA: Diagnosis present

## 2016-06-26 DIAGNOSIS — Z87442 Personal history of urinary calculi: Secondary | ICD-10-CM | POA: Diagnosis not present

## 2016-06-26 DIAGNOSIS — N179 Acute kidney failure, unspecified: Secondary | ICD-10-CM

## 2016-06-26 DIAGNOSIS — Z85828 Personal history of other malignant neoplasm of skin: Secondary | ICD-10-CM | POA: Diagnosis not present

## 2016-06-26 DIAGNOSIS — I2510 Atherosclerotic heart disease of native coronary artery without angina pectoris: Secondary | ICD-10-CM | POA: Diagnosis present

## 2016-06-26 DIAGNOSIS — J209 Acute bronchitis, unspecified: Secondary | ICD-10-CM | POA: Diagnosis present

## 2016-06-26 DIAGNOSIS — N400 Enlarged prostate without lower urinary tract symptoms: Secondary | ICD-10-CM | POA: Diagnosis present

## 2016-06-26 DIAGNOSIS — I129 Hypertensive chronic kidney disease with stage 1 through stage 4 chronic kidney disease, or unspecified chronic kidney disease: Secondary | ICD-10-CM | POA: Diagnosis present

## 2016-06-26 DIAGNOSIS — R531 Weakness: Secondary | ICD-10-CM | POA: Diagnosis not present

## 2016-06-26 DIAGNOSIS — K5730 Diverticulosis of large intestine without perforation or abscess without bleeding: Secondary | ICD-10-CM | POA: Diagnosis present

## 2016-06-26 DIAGNOSIS — Z8249 Family history of ischemic heart disease and other diseases of the circulatory system: Secondary | ICD-10-CM | POA: Diagnosis not present

## 2016-06-26 DIAGNOSIS — E861 Hypovolemia: Secondary | ICD-10-CM | POA: Diagnosis present

## 2016-06-26 HISTORY — PX: COLONOSCOPY WITH PROPOFOL: SHX5780

## 2016-06-26 HISTORY — PX: CYSTOSCOPY/RETROGRADE/URETEROSCOPY/STONE EXTRACTION WITH BASKET: SHX5317

## 2016-06-26 HISTORY — PX: HOLMIUM LASER APPLICATION: SHX5852

## 2016-06-26 LAB — BASIC METABOLIC PANEL
Anion gap: 7 (ref 5–15)
BUN: 17 mg/dL (ref 6–20)
CALCIUM: 8.2 mg/dL — AB (ref 8.9–10.3)
CO2: 21 mmol/L — AB (ref 22–32)
CREATININE: 1.3 mg/dL — AB (ref 0.61–1.24)
Chloride: 115 mmol/L — ABNORMAL HIGH (ref 101–111)
GFR, EST AFRICAN AMERICAN: 55 mL/min — AB (ref >60)
GFR, EST NON AFRICAN AMERICAN: 47 mL/min — AB (ref >60)
Glucose, Bld: 104 mg/dL — ABNORMAL HIGH (ref 65–99)
Potassium: 4.1 mmol/L (ref 3.5–5.1)
Sodium: 143 mmol/L (ref 135–145)

## 2016-06-26 LAB — CBC
HCT: 37.1 % — ABNORMAL LOW (ref 39.0–52.0)
Hemoglobin: 12.1 g/dL — ABNORMAL LOW (ref 13.0–17.0)
MCH: 28 pg (ref 26.0–34.0)
MCHC: 32.6 g/dL (ref 30.0–36.0)
MCV: 85.9 fL (ref 78.0–100.0)
PLATELETS: 172 10*3/uL (ref 150–400)
RBC: 4.32 MIL/uL (ref 4.22–5.81)
RDW: 15.1 % (ref 11.5–15.5)
WBC: 7.7 10*3/uL (ref 4.0–10.5)

## 2016-06-26 LAB — URINE CULTURE

## 2016-06-26 SURGERY — CYSTOSCOPY, WITH CALCULUS REMOVAL USING BASKET
Anesthesia: General | Site: Ureter | Laterality: Right

## 2016-06-26 SURGERY — COLONOSCOPY WITH PROPOFOL
Anesthesia: Moderate Sedation

## 2016-06-26 MED ORDER — LACTATED RINGERS IV SOLN
INTRAVENOUS | Status: DC | PRN
  Administered 2016-06-26 (×2): via INTRAVENOUS

## 2016-06-26 MED ORDER — PROMETHAZINE HCL 25 MG/ML IJ SOLN: 6.2500 mg | INTRAMUSCULAR | Status: DC | PRN

## 2016-06-26 MED ORDER — PROPOFOL 10 MG/ML IV BOLUS
INTRAVENOUS | Status: DC | PRN
  Administered 2016-06-26: 80 mg via INTRAVENOUS

## 2016-06-26 MED ORDER — ALBUTEROL SULFATE (2.5 MG/3ML) 0.083% IN NEBU
2.5000 mg | INHALATION_SOLUTION | Freq: Four times a day (QID) | RESPIRATORY_TRACT | Status: DC
  Administered 2016-06-26 – 2016-06-27 (×5): 2.5 mg via RESPIRATORY_TRACT
  Filled 2016-06-26 (×6): qty 3

## 2016-06-26 MED ORDER — DEXTROSE 5 % IV SOLN
INTRAVENOUS | Status: DC | PRN
  Administered 2016-06-26: 50 ug/min via INTRAVENOUS

## 2016-06-26 MED ORDER — FENTANYL CITRATE (PF) 100 MCG/2ML IJ SOLN: 25.0000 ug | INTRAMUSCULAR | Status: DC | PRN

## 2016-06-26 MED ORDER — PROPOFOL 10 MG/ML IV BOLUS
INTRAVENOUS | Status: AC
  Filled 2016-06-26: qty 20

## 2016-06-26 MED ORDER — DEXAMETHASONE SODIUM PHOSPHATE 10 MG/ML IJ SOLN
INTRAMUSCULAR | Status: AC
  Filled 2016-06-26: qty 1

## 2016-06-26 MED ORDER — LIDOCAINE 2% (20 MG/ML) 5 ML SYRINGE
INTRAMUSCULAR | Status: DC | PRN
  Administered 2016-06-26: 20 mg via INTRAVENOUS

## 2016-06-26 MED ORDER — PHENYLEPHRINE 40 MCG/ML (10ML) SYRINGE FOR IV PUSH (FOR BLOOD PRESSURE SUPPORT)
PREFILLED_SYRINGE | INTRAVENOUS | Status: AC
  Filled 2016-06-26: qty 10

## 2016-06-26 MED ORDER — MIDAZOLAM HCL 5 MG/ML IJ SOLN
INTRAMUSCULAR | Status: AC
  Filled 2016-06-26: qty 1

## 2016-06-26 MED ORDER — PHENYLEPHRINE 40 MCG/ML (10ML) SYRINGE FOR IV PUSH (FOR BLOOD PRESSURE SUPPORT)
PREFILLED_SYRINGE | INTRAVENOUS | Status: DC | PRN
Start: 2016-06-26 — End: 2016-06-26
  Administered 2016-06-26: 120 ug via INTRAVENOUS
  Administered 2016-06-26: 80 ug via INTRAVENOUS
  Administered 2016-06-26: 160 ug via INTRAVENOUS

## 2016-06-26 MED ORDER — ALBUTEROL SULFATE (2.5 MG/3ML) 0.083% IN NEBU
INHALATION_SOLUTION | RESPIRATORY_TRACT | Status: AC
  Filled 2016-06-26: qty 3

## 2016-06-26 MED ORDER — MIDAZOLAM HCL 2 MG/2ML IJ SOLN: 0.5000 mg | Freq: Once | INTRAMUSCULAR | Status: DC | PRN

## 2016-06-26 MED ORDER — ONDANSETRON HCL 4 MG/2ML IJ SOLN
INTRAMUSCULAR | Status: DC | PRN
  Administered 2016-06-26: 4 mg via INTRAVENOUS

## 2016-06-26 MED ORDER — SODIUM CHLORIDE 0.9 % IV SOLN
INTRAVENOUS | Status: DC
  Administered 2016-06-26: 15:00:00 via INTRAVENOUS
  Filled 2016-06-26 (×2): qty 1000

## 2016-06-26 MED ORDER — DEXAMETHASONE SODIUM PHOSPHATE 10 MG/ML IJ SOLN
INTRAMUSCULAR | Status: DC | PRN
  Administered 2016-06-26: 10 mg via INTRAVENOUS

## 2016-06-26 MED ORDER — FENTANYL CITRATE (PF) 100 MCG/2ML IJ SOLN
INTRAMUSCULAR | Status: DC | PRN
  Administered 2016-06-26: 25 ug via INTRAVENOUS

## 2016-06-26 MED ORDER — MEPERIDINE HCL 50 MG/ML IJ SOLN: 6.2500 mg | INTRAMUSCULAR | Status: DC | PRN

## 2016-06-26 MED ORDER — MIDAZOLAM HCL 5 MG/5ML IJ SOLN
INTRAMUSCULAR | Status: DC | PRN
  Administered 2016-06-26: 1 mg via INTRAVENOUS

## 2016-06-26 MED ORDER — ALBUTEROL SULFATE HFA 108 (90 BASE) MCG/ACT IN AERS
INHALATION_SPRAY | RESPIRATORY_TRACT | Status: DC | PRN
  Administered 2016-06-26: 2 via RESPIRATORY_TRACT

## 2016-06-26 MED ORDER — FENTANYL CITRATE (PF) 100 MCG/2ML IJ SOLN
INTRAMUSCULAR | Status: AC
  Filled 2016-06-26: qty 2

## 2016-06-26 MED ORDER — ALBUTEROL SULFATE HFA 108 (90 BASE) MCG/ACT IN AERS
INHALATION_SPRAY | RESPIRATORY_TRACT | Status: AC
  Filled 2016-06-26: qty 6.7

## 2016-06-26 MED ORDER — ONDANSETRON HCL 4 MG/2ML IJ SOLN
INTRAMUSCULAR | Status: AC
  Filled 2016-06-26: qty 2

## 2016-06-26 MED ORDER — ALBUTEROL SULFATE (2.5 MG/3ML) 0.083% IN NEBU
2.5000 mg | INHALATION_SOLUTION | Freq: Once | RESPIRATORY_TRACT | Status: AC
  Administered 2016-06-26: 2.5 mg via RESPIRATORY_TRACT

## 2016-06-26 MED ORDER — SODIUM CHLORIDE 0.9 % IV SOLN: INTRAVENOUS | Status: DC

## 2016-06-26 MED ORDER — SODIUM CHLORIDE 0.9 % IV SOLN
INTRAVENOUS | Status: DC | PRN
  Administered 2016-06-26: 5 mL

## 2016-06-26 MED ORDER — LIDOCAINE 2% (20 MG/ML) 5 ML SYRINGE
INTRAMUSCULAR | Status: AC
  Filled 2016-06-26: qty 5

## 2016-06-26 MED ORDER — 0.9 % SODIUM CHLORIDE (POUR BTL) OPTIME
TOPICAL | Status: DC | PRN
  Administered 2016-06-26: 1000 mL

## 2016-06-26 MED ORDER — SODIUM CHLORIDE 0.9 % IR SOLN
Status: DC | PRN
  Administered 2016-06-26 (×2): 1000 mL
  Administered 2016-06-26: 3000 mL

## 2016-06-26 SURGICAL SUPPLY — 22 items

## 2016-06-26 SURGICAL SUPPLY — 20 items
BAG URO CATCHER STRL LF (MISCELLANEOUS) ×4 IMPLANT
BASKET ZERO TIP NITINOL 2.4FR (BASKET) ×2 IMPLANT
BSKT STON RTRVL ZERO TP 2.4FR (BASKET) ×2
CATH INTERMIT  6FR 70CM (CATHETERS) ×2 IMPLANT
CLOTH BEACON ORANGE TIMEOUT ST (SAFETY) ×4 IMPLANT
COVER SURGICAL LIGHT HANDLE (MISCELLANEOUS) ×4 IMPLANT
FIBER LASER FLEXIVA 365 (UROLOGICAL SUPPLIES) ×2 IMPLANT
FIBER LASER TRAC TIP (UROLOGICAL SUPPLIES) IMPLANT
GLOVE BIOGEL M STRL SZ7.5 (GLOVE) ×4 IMPLANT
GOWN STRL REUS W/TWL LRG LVL3 (GOWN DISPOSABLE) ×8 IMPLANT
GUIDEWIRE ANG ZIPWIRE 038X150 (WIRE) ×2 IMPLANT
GUIDEWIRE STR DUAL SENSOR (WIRE) ×4 IMPLANT
IV NS 1000ML (IV SOLUTION) ×4
IV NS 1000ML BAXH (IV SOLUTION) ×2 IMPLANT
MANIFOLD NEPTUNE II (INSTRUMENTS) ×4 IMPLANT
PACK CYSTO (CUSTOM PROCEDURE TRAY) ×4 IMPLANT
SHEATH ACCESS URETERAL 38CM (SHEATH) IMPLANT
STENT CONTOUR 6FRX24X.038 (STENTS) ×2 IMPLANT
TUBING CONNECTING 10 (TUBING) ×3 IMPLANT
TUBING CONNECTING 10' (TUBING) ×1

## 2016-06-26 NOTE — Progress Notes (Signed)
A lg pink foam dressing Applied  To buttocks.The area is a grayish color with broken skin on each side of his  buttocks

## 2016-06-26 NOTE — Op Note (Signed)
Preoperative diagnosis: Right ureteral calculus  Postoperative diagnosis: Right ureteral calculus  Procedure:  1. Cystoscopy 2. Right ureteroscopy and stone removal 3. Ureteroscopic laser lithotripsy 4. Right ureteral stent placement (6 x 24) with string 5. Right retrograde pyelography with interpretation  Surgeon: Pryor Curia. M.D.  Anesthesia: General  Complications: None  Intraoperative findings: Right retrograde pyelography demonstrated a filling defect within the distal right ureter consistent with the patient's known calculus without other abnormalities.  EBL: Minimal  Specimens: 1. Right ureteral calculus  Disposition of specimens: [facility] for stone analysis  Indication: Eduardo Salazar is a 84 y.o. year old patient with urolithiasis and a distal 9 mm right ureteral stent. After reviewing the management options for treatment, the patient elected to proceed with the above surgical procedure(s). We have discussed the potential benefits and risks of the procedure, side effects of the proposed treatment, the likelihood of the patient achieving the goals of the procedure, and any potential problems that might occur during the procedure or recuperation. Informed consent has been obtained.  Description of procedure:  The patient was taken to the operating room and general anesthesia was induced.  The patient was placed in the dorsal lithotomy position, prepped and draped in the usual sterile fashion, and preoperative antibiotics were administered. A preoperative time-out was performed.   Cystourethroscopy was performed.  The patient's urethra was examined and was normal. The bladder was then systematically examined in its entirety. There was no evidence for any bladder tumors, stones, or other mucosal pathology.    Attention then turned to the right ureteral orifice and a ureteral catheter was used to intubate the ureteral orifice.  Omnipaque contrast  was injected through the ureteral catheter and a retrograde pyelogram was performed with findings as dictated above.  A 0.38 sensor guidewire was then advanced up the right ureter into the renal pelvis under fluoroscopic guidance. The 6 Fr semirigid ureteroscope was then advanced into the ureter next to the guidewire and the calculus was identified.   The stone was then fragmented with the 365 micron holmium laser fiber on a setting of 0.6 J and frequency of 6 Hz.   All stones were then removed from the ureter with a zero tip nitinol basket.  Reinspection of the ureter revealed no remaining visible stones or fragments.   The wire was then backloaded through the cystoscope and a ureteral stent was advance over the wire using Seldinger technique.  The stent was positioned appropriately under fluoroscopic and cystoscopic guidance.  The wire was then removed with an adequate stent curl noted in the renal pelvis as well as in the bladder.  The bladder was then emptied and the procedure ended.  The patient appeared to tolerate the procedure well and without complications.  The patient was able to be awakened and transferred to the recovery unit in satisfactory condition.

## 2016-06-26 NOTE — Clinical Social Work Note (Addendum)
Fl2 initiated and placed in Epic. Will need update prior to d/c for return to [facility]. Per Dr. Carles Collet- anticipate d/c tomorrow if stable back to [facility]. Spoke to [place] at [employer] - ok for patient to return tomorrow. Will need to review updated Fl2 and have scripts for any new meds prior to d/c.  Will need to send updated Fl2 to [phone] - attention Mikle Bosworth.   Lorie Phenix. Pauline Good, [place]

## 2016-06-26 NOTE — Anesthesia Preprocedure Evaluation (Signed)
Anesthesia Evaluation  Patient identified by MRN, date of birth, ID band Patient awake    Reviewed: Allergy & Precautions, NPO status , Patient's Chart, lab work & pertinent test results, reviewed documented beta blocker date and time   History of Anesthesia Complications Negative for: history of anesthetic complications  Airway Mallampati: II  TM Distance: <3 FB Neck ROM: Full    Dental  (+) Edentulous Lower, Edentulous Upper   Pulmonary COPD, former smoker,   Treated with albuterol MDI   + wheezing      Cardiovascular hypertension, Pt. on medications and Pt. on home beta blockers + Past MI (? never had heart symptoms)   Rhythm:Regular Rate:Normal     Neuro/Psych negative neurological ROS     GI/Hepatic Neg liver ROS, GERD  Controlled and Medicated,  Endo/Other  Morbid obesity  Renal/GU Renal InsufficiencyRenal disease (creat 1.30)     Musculoskeletal   Abdominal (+) + obese,   Peds  Hematology negative hematology ROS (+)   Anesthesia Other Findings   Reproductive/Obstetrics                             Anesthesia Physical Anesthesia Plan  ASA: III  Anesthesia Plan: General   Post-op Pain Management:    Induction: Intravenous  Airway Management Planned: LMA  Additional Equipment:   Intra-op Plan:   Post-operative Plan:   Informed Consent: I have reviewed the patients History and Physical, chart, labs and discussed the procedure including the risks, benefits and alternatives for the proposed anesthesia with the patient or authorized representative who has indicated his/her understanding and acceptance.   Consent reviewed with POA  Plan Discussed with: CRNA and Surgeon  Anesthesia Plan Comments: (plan routine monitors, GA LMA OK Discussed with pt and his daughters)        Anesthesia Quick Evaluation

## 2016-06-26 NOTE — Interval H&P Note (Signed)
History and Physical Interval Note:  06/26/2016 8:29 AM  Eduardo Salazar  has presented today for surgery, with the diagnosis of Abnormal CT Abdomen/pelvis  The various methods of treatment have been discussed with the patient and family. After consideration of risks, benefits and other options for treatment, the patient has consented to  Procedure(s): COLONOSCOPY WITH PROPOFOL (N/A) as a surgical intervention .  The patient's history has been reviewed, patient examined, no change in status, stable for surgery.  I have reviewed the patient's chart and labs.  Questions were answered to the patient's satisfaction.     Fabian Coca

## 2016-06-26 NOTE — Anesthesia Procedure Notes (Signed)
Procedure Name: LMA Insertion Date/Time: 06/26/2016 12:16 PM Performed by: Cynda Familia Pre-anesthesia Checklist: Patient identified, Emergency Drugs available, Suction available and Patient being monitored Patient Re-evaluated:Patient Re-evaluated prior to inductionOxygen Delivery Method: Circle System Utilized Preoxygenation: Pre-oxygenation with 100% oxygen Intubation Type: IV induction Ventilation: Mask ventilation without difficulty LMA: LMA inserted LMA Size: 4.0 Number of attempts: 2 Airway Equipment and Method: Bite block Placement Confirmation: positive ETCO2 Tube secured with: Tape Dental Injury: Teeth and Oropharynx as per pre-operative assessment  Comments: Smooth IV induction Glennon Mac--- LMA 4 with gastric port inserted by Glennon Mac--- poor ventilation--- removed #4 LMA- reg inserted by Glennon Mac with adequate ventilation--- atraumatic- mouth as preop - bilat BS

## 2016-06-26 NOTE — Transfer of Care (Signed)
Immediate Anesthesia Transfer of Care Note  Patient: Eduardo Salazar  Procedure(s) Performed: Procedure(s): CYSTOSCOPY/RIGHT RETROGRADE/RIGHT URETEROSCOPY/LASER LITHOTRIPSY WITH STONE EXTRACTION WITH BASKET/RIGHT URETERAL STENT PLACEMENT (Right) HOLMIUM LASER APPLICATION (Right)  Patient Location: PACU  Anesthesia Type:General  Level of Consciousness: awake and alert   Airway & Oxygen Therapy: Patient Spontanous Breathing and Patient connected to face mask oxygen  Post-op Assessment: Report given to RN and Post -op Vital signs reviewed and stable  Post vital signs: Reviewed and stable  Last Vitals:  Vitals:   06/26/16 0953 06/26/16 1035  BP: (!) 106/48   Pulse: 65 79  Resp: 18 18  Temp: 37.1 C     Last Pain:  Vitals:   06/26/16 0953  TempSrc: Oral  PainSc:       Patients Stated Pain Goal: 2 (11/46/43 1427)  Complications: No apparent anesthesia complications

## 2016-06-26 NOTE — Anesthesia Postprocedure Evaluation (Signed)
Anesthesia Post Note  Patient: Eduardo Salazar  Procedure(s) Performed: Procedure(s) (LRB): CYSTOSCOPY/RIGHT RETROGRADE/RIGHT URETEROSCOPY/LASER LITHOTRIPSY WITH STONE EXTRACTION WITH BASKET/RIGHT URETERAL STENT PLACEMENT (Right) HOLMIUM LASER APPLICATION (Right)  Patient location during evaluation: PACU Anesthesia Type: General Level of consciousness: awake and alert and patient cooperative Pain management: pain level controlled Vital Signs Assessment: post-procedure vital signs reviewed and stable Respiratory status: spontaneous breathing, nonlabored ventilation, respiratory function stable and patient connected to nasal cannula oxygen Cardiovascular status: blood pressure returned to baseline and stable Postop Assessment: no signs of nausea or vomiting Anesthetic complications: no       Last Vitals:  Vitals:   06/26/16 1424 06/26/16 1441  BP: (!) 105/58 108/65  Pulse: 73 89  Resp: 13 14  Temp: 36.5 C 36.4 C    Last Pain:  Vitals:   06/26/16 1424  TempSrc:   PainSc: 0-No pain                 Drayton Tieu,E. Nivaan Dicenzo

## 2016-06-26 NOTE — Interval H&P Note (Deleted)
History and Physical Interval Note:  06/26/2016 8:29 AM  Eduardo Salazar  has presented today for surgery, with the diagnosis of Abnormal CT Abdomen/pelvis  The various methods of treatment have been discussed with the patient and family. After consideration of risks, benefits and other options for treatment, the patient has consented to  Procedure(s): COLONOSCOPY WITH PROPOFOL (N/A) as a surgical intervention .  The patient's history has been reviewed, patient examined, no change in status, stable for surgery.  I have reviewed the patient's chart and labs.  Questions were answered to the patient's satisfaction.     Prisha Hiley

## 2016-06-26 NOTE — [place] FL2 (Signed)
[place] MEDICAID FL2 LEVEL OF CARE SCREENING TOOL     IDENTIFICATION  Patient Name: Eduardo Salazar Birthdate: 1925/09/19 Sex: male Admission Date (Current Location): 06/25/2016  [facility] and [place] Number:  Herbalist and Address:  [facility],  [place] [address] [place]      Provider Number: [id number]  Attending Physician Name and Address:  Orson Eva, MD  Relative Name and Phone Number:       Current Level of Care: Hospital Recommended Level of Care: [place] Prior Approval Number:    Date Approved/Denied:   PASRR Number:    Discharge Plan: Domiciliary (Rest home)    Current Diagnoses: Patient Active Problem List   Diagnosis Date Noted  . AKI (acute kidney injury) ([place]) 06/25/2016  . Bronchitis 06/25/2016  . Abdominal pain 06/25/2016  . UTI (lower urinary tract infection) 03/05/2015  . Coronary artery disease involving native heart without angina pectoris 03/05/2015  . Benign essential HTN 03/05/2015  . Leukocytosis 03/05/2015  . T12 compression fracture ([place]) 05/26/2014  . BPH (benign prostatic hyperplasia) 05/26/2014  . Squamous cell carcinoma of skin of other and unspecified parts of face 10/27/2012  . CKD (chronic kidney disease) stage 3, GFR 30-59 ml/min 09/13/2012  . Pyloric channel ulcer 09/12/2012    Orientation RESPIRATION BLADDER Height & Weight     Self, Time, Situation, Place  Normal Incontinent Weight: 196 lb 3.4 oz (89 kg) Height:  5\' 7"  (170.2 cm)  BEHAVIORAL SYMPTOMS/MOOD NEUROLOGICAL BOWEL NUTRITION STATUS      Incontinent Diet  AMBULATORY STATUS COMMUNICATION OF NEEDS Skin     Verbally Other (Comment) (stage 2- L buttocks- shallow open ulcer with red/pink wound bed without slough)                       Personal Care Assistance Level of Assistance              Functional Limitations Info             SPECIAL CARE FACTORS FREQUENCY    Physical Therapy    5 X a week                 Contractures Contractures Info: Not present    Additional Factors Info  Code Status, Allergies Code Status Info: Full Allergies Info: NKA           Current Medications (06/26/2016):  This is the current hospital active medication list Current Facility-Administered Medications  Medication Dose Route Frequency Provider Last Rate Last Dose  . 0.9 %  sodium chloride infusion   Intravenous Continuous Belkys A Regalado, MD 75 mL/hr at 06/25/16 1510    . 0.9 %  sodium chloride infusion   Intravenous Continuous Lavone Nian Beecher, [place]      . Doug Sou Hold] acetaminophen (TYLENOL) tablet 650 mg  650 mg Oral Q6H PRN Belkys A Regalado, MD   650 mg at 06/25/16 1844   Or  . [MAR Hold] acetaminophen (TYLENOL) suppository 650 mg  650 mg Rectal Q6H PRN Belkys A Regalado, MD      . Doug Sou Hold] acetaminophen (TYLENOL) tablet 650 mg  650 mg Oral Q6H PRN Belkys A Regalado, MD   650 mg at 06/25/16 1138  . [MAR Hold] albuterol (PROVENTIL) (2.5 MG/3ML) 0.083% nebulizer solution 2.5 mg  2.5 mg Nebulization QID Belkys A Regalado, MD      . Doug Sou Hold] carvedilol (COREG) tablet 3.125 mg  3.125 mg  Oral Daily Belkys A Regalado, MD      . Doug Sou Hold] cefTRIAXone (ROCEPHIN) 1 g in dextrose 5 % 50 mL IVPB  1 g Intravenous Q24H Belkys A Regalado, MD   1 g at 06/25/16 1530  . [MAR Hold] chlorhexidine (PERIDEX) 0.12 % solution 15 mL  15 mL Mouth Rinse BID Belkys A Regalado, MD   15 mL at 06/25/16 1510  . [MAR Hold] famotidine (PEPCID) tablet 20 mg  20 mg Oral QHS Belkys A Regalado, MD   20 mg at 06/25/16 2102  . [MAR Hold] finasteride (PROSCAR) tablet 5 mg  5 mg Oral Daily Belkys A Regalado, MD   5 mg at 06/25/16 1627  . [MAR Hold] guaiFENesin (ROBITUSSIN) 100 MG/5ML solution 200 mg  200 mg Oral Q6H PRN Belkys A Regalado, MD   200 mg at 06/25/16 1628  . [MAR Hold] heparin injection 5,000 Units  5,000 Units Subcutaneous Q8H Belkys A Regalado, MD   5,000 Units at 06/26/16 0606  . [facility] Hold] MEDLINE mouth rinse   15 mL Mouth Rinse q12n4p Belkys A Regalado, MD      . Doug Sou Hold] metroNIDAZOLE (FLAGYL) IVPB 500 mg  500 mg Intravenous Q8H Belkys A Regalado, MD 100 mL/hr at 06/26/16 0335 500 mg at 06/26/16 0335  . [MAR Hold] nitroGLYCERIN (NITROSTAT) SL tablet 0.4 mg  0.4 mg Sublingual Q5 min PRN Belkys A Regalado, MD      . Doug Sou Hold] ondansetron (ZOFRAN) tablet 4 mg  4 mg Oral Q6H PRN Belkys A Regalado, MD       Or  . Doug Sou Hold] ondansetron (ZOFRAN) injection 4 mg  4 mg Intravenous Q6H PRN Belkys A Regalado, MD      . Doug Sou Hold] tamsulosin (FLOMAX) capsule 0.4 mg  0.4 mg Oral BID Belkys A Regalado, MD   0.4 mg at 06/25/16 2102     Discharge Medications: Please see discharge summary for a list of discharge medications.  Relevant Imaging Results:  Relevant Lab Results:   Additional Information SSN:  [id number]  Williemae Area, LCSW

## 2016-06-26 NOTE — H&P (View-Only) (Signed)
Consultation  Referring Provider:  Dr. Tyrell Antonio    Primary Care Physician:  [name], MD Primary Gastroenterologist: Dr. Hilarie Fredrickson        Reason for Consultation: Abnormal CT scan, Abdominal Pain, Nausea, Decrease in appetite             HPI:   Eduardo Salazar is a 83 y.o. Caucasian male with a past medical history of hypertension and others listed below, who presented to the ED today from his assisted living facility with a complaint of nausea, abdominal pain and a decrease in appetite over the past 3-4 days.   At time of interview he is found laying in bed with his daughter by his bedside, she does provide much of his history as he is a poor historian. Between them both we still have quite a few details missing. She describes that he developed a viral illness about 4-5 days ago with a chest cold/head cold and when she went to see him yesterday, he told her he had not eaten for 2 or 3 days. He did develop nausea this morning and had some vomiting after prepping for his CT. He has been complaining of lower abdominal pain which is "new" per his daughter. He does tell me this pain is somewhat better at time of my exam. Apparently he had been evaluated by his PCP for this and was started onan antibiotic and a chest x-ray at that time was negative for pneumonia.   Patient relays that he has bowel movements with help of a daily stool softener but does not remember one since earlier this week. He cannot tell me if he is passing gas or not.  ED Course: Cr at 1.7, hb 13.4, influenza negative, Chest x ray; no active cardiopulmonary diseases. CT abdomen pelvis; Focal thickening of rectosigmoid junction is noted concerning for possible neoplasm. Sigmoidoscopy is recommended for further evaluation. Probable sludge seen within the gallbladder. Severe left renal atrophy with nonobstructive calculus. No hydronephrosis or ureteral dilatation is noted. However, 9 mm nonobstructive calculus is seen in the distal  right ureter.  Previous GI history: 11/14/12-EGD, Dr. Hilarie Fredrickson: Impression: Mucosa of the esophagus appeared normal, mucosa of the stomach appeared normal and previously seen gastric ulcer healed, mild deformity/scar at the pyloric channel without significant narrowing, one of mucosa showed no other abnormalities in the bulb and second portion of duodenum.  Past Medical History:  Diagnosis Date  . BPH (benign prostatic hyperplasia)   . Hx of radiation therapy 01/15/13- 02/13/13   left temple 5000 cGy 20 sessions  . Hypertension   . Myocardial infarction 2014   mild"no symptoms" was told 2 months ago  . skin ca    top of head; xrt comp    Past Surgical History:  Procedure Laterality Date  . APPENDECTOMY    . CATARACT EXTRACTION, BILATERAL    . CYSTOSCOPY W/ URETERAL STENT PLACEMENT  02/2002   on left for ureteral stone  . CYSTOSCOPY WITH RETROGRADE PYELOGRAM, URETEROSCOPY AND STENT PLACEMENT Left 07/31/2012   Procedure: CYSTOSCOPY WITH LEFT RETROGRADE PYELOGRAM, LEFT URETEROSCOPY;  Surgeon: Claybon Jabs, MD;  Location: [facility];  Service: Urology;  Laterality: Left;  . ESOPHAGOGASTRODUODENOSCOPY N/A 09/14/2012   Procedure: ESOPHAGOGASTRODUODENOSCOPY (EGD);  Surgeon: Jerene Bears, MD;  Location: [facility];  Service: Gastroenterology;  Laterality: N/A;  . ESOPHAGOGASTRODUODENOSCOPY N/A 11/14/2012   Procedure: ESOPHAGOGASTRODUODENOSCOPY (EGD);  Surgeon: Jerene Bears, MD;  Location: [facility];  Service: Gastroenterology;  Laterality: N/A;  .  MOHS SURGERY Left 11/28/12   forehead  . SKIN BIOPSY Left 10/27/12   left forehead near frontal scalp  . VASECTOMY    . WRIST SURGERY     "nerve repair"    Family History  Problem Relation Age of Onset  . Heart attack Father   . Arthritis Mother   . Colon cancer Neg Hx     Social History  Substance Use Topics  . Smoking status: Former Smoker    Packs/day: 0.50    Years: 22.00    Types: Cigarettes    Quit date: 07/27/1973  . Smokeless  tobacco: Never Used  . Alcohol use No    Prior to Admission medications   Medication Sig Start Date End Date Taking? Authorizing Provider  acetaminophen (TYLENOL) 500 MG tablet Take 2 tablets (1,000 mg total) by mouth every 8 (eight) hours as needed for mild pain, fever or headache. Patient taking differently: Take 500 mg by mouth every 4 (four) hours as needed for mild pain, fever or headache.  05/28/14  Yes [name], MD  carvedilol (COREG) 3.125 MG tablet Take 3.125 mg by mouth daily.    Yes Historical Provider, MD  docusate sodium (COLACE) 100 MG capsule Take 100 mg by mouth daily.   Yes Historical Provider, MD  famotidine (PEPCID) 20 MG tablet Take 20 mg by mouth at bedtime. 06/11/16  Yes Historical Provider, MD  finasteride (PROSCAR) 5 MG tablet Take 5 mg by mouth daily.  11/02/12  Yes Historical Provider, MD  furosemide (LASIX) 20 MG tablet Take 20 mg by mouth daily. 06/23/16  Yes Historical Provider, MD  guaifenesin (ROBITUSSIN) 100 MG/5ML syrup Take 200 mg by mouth every 6 (six) hours as needed for cough.   Yes Historical Provider, MD  levofloxacin (LEVAQUIN) 750 MG tablet Take 750 mg by mouth daily. 06/21/16  Yes Historical Provider, MD  polyvinyl alcohol (ARTIFICIAL TEARS) 1.4 % ophthalmic solution Place 1 drop into both eyes 4 (four) times daily.   Yes Historical Provider, MD  spironolactone (ALDACTONE) 25 MG tablet Take 25 mg by mouth daily.  04/15/14  Yes Historical Provider, MD  tamsulosin (FLOMAX) 0.4 MG CAPS Take 0.4 mg by mouth 2 (two) times daily.    Yes Historical Provider, MD  Vitamin D, Ergocalciferol, (DRISDOL) 50000 UNITS CAPS capsule Take 50,000 Units by mouth every 7 (seven) days.  02/13/15  Yes Historical Provider, MD  nitroGLYCERIN (NITROSTAT) 0.4 MG SL tablet Place 0.4 mg under the tongue every 5 (five) minutes as needed for chest pain.    Historical Provider, MD  pantoprazole (PROTONIX) 40 MG tablet Take 1 tablet (40 mg total) by mouth daily. Patient not taking:  Reported on 06/25/2016 05/28/14   [name], MD  polyethylene glycol [facility] / [name]) packet Take 17 g by mouth daily. Patient not taking: Reported on 06/25/2016 05/28/14   [name], MD    Current Facility-Administered Medications  Medication Dose Route Frequency Provider Last Rate Last Dose  . acetaminophen (TYLENOL) tablet 650 mg  650 mg Oral Q6H PRN Belkys A Regalado, MD   650 mg at 06/25/16 1138  . albuterol (PROVENTIL) (2.5 MG/3ML) 0.083% nebulizer solution 2.5 mg  2.5 mg Nebulization Q6H Belkys A Regalado, MD      . iopamidol (ISOVUE-300) 61 % injection           . metroNIDAZOLE (FLAGYL) IVPB 500 mg  500 mg Intravenous Q8H Belkys A Regalado, MD 100 mL/hr at 06/25/16 1140 500 mg at 06/25/16 1140  Current Outpatient Prescriptions  Medication Sig Dispense Refill  . acetaminophen (TYLENOL) 500 MG tablet Take 2 tablets (1,000 mg total) by mouth every 8 (eight) hours as needed for mild pain, fever or headache. (Patient taking differently: Take 500 mg by mouth every 4 (four) hours as needed for mild pain, fever or headache. ) 30 tablet 0  . carvedilol (COREG) 3.125 MG tablet Take 3.125 mg by mouth daily.     Marland Kitchen docusate sodium (COLACE) 100 MG capsule Take 100 mg by mouth daily.    . famotidine (PEPCID) 20 MG tablet Take 20 mg by mouth at bedtime.    . finasteride (PROSCAR) 5 MG tablet Take 5 mg by mouth daily.     . furosemide (LASIX) 20 MG tablet Take 20 mg by mouth daily.    Marland Kitchen guaifenesin (ROBITUSSIN) 100 MG/5ML syrup Take 200 mg by mouth every 6 (six) hours as needed for cough.    Marland Kitchen levofloxacin (LEVAQUIN) 750 MG tablet Take 750 mg by mouth daily.    . polyvinyl alcohol (ARTIFICIAL TEARS) 1.4 % ophthalmic solution Place 1 drop into both eyes 4 (four) times daily.    Marland Kitchen spironolactone (ALDACTONE) 25 MG tablet Take 25 mg by mouth daily.     . tamsulosin (FLOMAX) 0.4 MG CAPS Take 0.4 mg by mouth 2 (two) times daily.     . Vitamin D, Ergocalciferol, (DRISDOL) 50000 UNITS CAPS  capsule Take 50,000 Units by mouth every 7 (seven) days.     . nitroGLYCERIN (NITROSTAT) 0.4 MG SL tablet Place 0.4 mg under the tongue every 5 (five) minutes as needed for chest pain.    . pantoprazole (PROTONIX) 40 MG tablet Take 1 tablet (40 mg total) by mouth daily. (Patient not taking: Reported on 06/25/2016) 90 tablet 3  . polyethylene glycol (MIRALAX / GLYCOLAX) packet Take 17 g by mouth daily. (Patient not taking: Reported on 06/25/2016) 14 each 0    Allergies as of 06/25/2016  . (No Known Allergies)     Review of Systems:    Constitutional: No weight loss, fever or chills Skin: No rash  Cardiovascular: No chest pain Respiratory: No SOB  Gastrointestinal: See HPI and otherwise negative Genitourinary: No dysuria Neurological: No headache Musculoskeletal: No new muscle or joint pain Hematologic: No bleeding Psychiatric: No history of depression or anxiety   Physical Exam:  Vital signs in last 24 hours: Temp:  [97.9 F (36.6 C)] 97.9 F (36.6 C) (04/13 0649) Pulse Rate:  [80-85] 80 (04/13 1145) Resp:  [17-20] 19 (04/13 1145) BP: (97-124)/(60-105) 97/66 (04/13 1145) SpO2:  [94 %-96 %] 96 % (04/13 1145)   General:   Pleasant Overweight Caucasian male appears to be in NAD, Well developed, Well nourished, alert and cooperative Head:  Normocephalic and atraumatic. Eyes:   PEERL, EOMI. No icterus. Conjunctiva pink. Ears:  Hard of hearing Neck:  Supple Throat: Oral cavity and pharynx without inflammation, swelling or lesion.  Lungs: Respirations even and unlabored. B/l wheezing Heart: Normal S1, S2. No MRG. Regular rate and rhythm. No peripheral edema, cyanosis or pallor.  Abdomen:  Soft, moderate distension, nontender. No rebound or guarding. Normal bowel sounds. No appreciable masses or hepatomegaly. Rectal:  Not performed.  Msk:  Symmetrical without gross deformities.  Extremities:  Without edema, no deformity or joint abnormality.  Neurologic:  Alert and  oriented x4;   grossly normal neurologically. Skin:   Dry and intact without significant lesions or rashes. Psychiatric:  Demonstrates good judgement and reason without abnormal affect or  behaviors.   LAB RESULTS:  Recent Labs  06/25/16 0744  WBC 9.8  HGB 13.4  HCT 39.8  PLT 205   BMET  Recent Labs  06/25/16 0744  NA 138  K 3.9  CL 104  CO2 25  GLUCOSE 125*  BUN 23*  CREATININE 1.70*  CALCIUM 9.0   LFT  Recent Labs  06/25/16 0744  PROT 7.3  ALBUMIN 3.4*  AST 21  ALT 17  ALKPHOS 100  BILITOT 0.7   PT/INR No results for input(s): LABPROT, INR in the last 72 hours.  STUDIES: Dg Chest 2 View  Result Date: 06/25/2016 CLINICAL DATA:  Cough, congestion, recent flu symptoms, weakness EXAM: CHEST  2 VIEW COMPARISON:  03/02/2015 FINDINGS: Cardiomediastinal silhouette is stable. No infiltrate or pulmonary edema. Surgical clips upper paraspinal region are again noted. No infiltrate or pulmonary edema. Mild left basilar atelectasis or scarring. Degenerative changes thoracic spine. IMPRESSION: No active cardiopulmonary disease. Electronically Signed   By: Lahoma Crocker M.D.   On: 06/25/2016 08:10   Ct Abdomen Pelvis W Contrast  Result Date: 06/25/2016 CLINICAL DATA:  Generalized abdominal pain. EXAM: CT ABDOMEN AND PELVIS WITH CONTRAST TECHNIQUE: Multidetector CT imaging of the abdomen and pelvis was performed using the standard protocol following bolus administration of intravenous contrast. CONTRAST:  45mL ISOVUE-300 IOPAMIDOL (ISOVUE-300) INJECTION 61% COMPARISON:  Radiograph same day.  CT scan of March 02, 2015. FINDINGS: Lower chest: Bronchiectasis is noted in the lower lobes bilaterally. No acute pulmonary disease is noted. Hepatobiliary: Liver appears normal. Possible sludge seen in the gallbladder lumen. Pancreas: Unremarkable. No pancreatic ductal dilatation or surrounding inflammatory changes. Spleen: Normal in size without focal abnormality. Adrenals/Urinary Tract: Adrenal glands  appear normal. Severe left renal atrophy is noted. Nonobstructive calculus is noted in the left kidney. Right kidney appears normal. No hydronephrosis or right ureteral dilatation is noted. However, there is a 9 mm nonobstructive calculus seen in the distal right ureter. Urinary bladder is unremarkable. Stomach/Bowel: Diverticulosis of descending and sigmoid colon is noted without inflammation. There is no evidence of bowel obstruction. Status post appendectomy. Focal thickening of rectosigmoid junction is noted concerning for possible neoplasm. Vascular/Lymphatic: Aortic atherosclerosis. No enlarged abdominal or pelvic lymph nodes. Reproductive: Prostatic calcifications are noted. Prostate gland appears to be normal in size. Other: Small fat containing left inguinal hernia is noted. Musculoskeletal: No acute or significant osseous findings. IMPRESSION: Focal thickening of rectosigmoid junction is noted concerning for possible neoplasm. Sigmoidoscopy is recommended for further evaluation. Bronchiectasis is noted in both lower lobes. Probable sludge seen within the gallbladder. Severe left renal atrophy with nonobstructive calculus. No hydronephrosis or ureteral dilatation is noted. However, 9 mm nonobstructive calculus is seen in the distal right ureter. Diverticulosis of descending and sigmoid colon is noted without inflammation. Aortic atherosclerosis. Electronically Signed   By: Marijo Conception, M.D.   On: 06/25/2016 10:35   Dg Abd 2 Views  Result Date: 06/25/2016 CLINICAL DATA:  Acute generalized abdominal pain. EXAM: ABDOMEN - 2 VIEW COMPARISON:  Radiographs of May 26, 2014. FINDINGS: Mildly dilated small bowel loops are noted in the right lower quadrant which may represent ileus or possibly obstruction. No colonic dilatation is noted. Mild amount of stool is noted in the colon. No free air is noted. No definite calculi are noted. IMPRESSION: Mildly dilated small bowel loops are noted in the right lower  quadrant which may represent ileus or possibly small bowel obstruction. Follow-up radiographs are recommended. Electronically Signed   By: Sabino Dick  Brooke Bonito, M.D.   On: 06/25/2016 08:11     PREVIOUS ENDOSCOPIES:            See HPI   Impression / Plan:   Impression: 1. Abnormal Ct abdomen/pelvis:See above showing both a 9 mm nonobstructive calculus in the distal right ureter as well as rectosigmoid thickening with question of neoplasm, patient also had abdominal x-ray which showed mildly dilated small bowel loops in the right lower quadrant which may represent ileus or small bowel obstruction, he is unable to tell me when he had his last bowel movement or if he is still passing gas 2. Abdominal Pain: Consider relation to rectal thickening vs kidney stone vs obstruction? 3. Nausea:consider relation to kidney stone vs abdominal pain vs obstruction?  4. Decreased appetite:consider relation to above vs recent abx vs kidney stone 5. 64mm kidney stone  Plan: 1. After discussion with Dr. Ardis Hughs, he recommends proceeding with colonoscopy for further eval of abnormal CT. Will make pt npo after midnight, prep tonight with plans for Dr. Collene Mares to proceed with colonoscopy tomorrow while covering for [place] over the weekend. 2. Continue supportive measures 3. Please await further recs from Dr. Ardis Hughs later today  Thank you for your kind consultation, we will continue to follow.  Lavone Nian [facility]  06/25/2016, 11:46 AM Pager #: [phone]   ________________________________________________________________________  Velora Heckler GI MD note:  I personally examined the patient, reviewed the data and agree with the assessment and plan described above.  CT scan suggest distal sigmoid thickening.  This may be neoplastic and I recommend a colonoscopy to evaluate further. He is pretty frail but I think he will prep well enough. If he has serious difficulties with the prep, will change plans to flexible sigmoidoscopy.   I discussed this with him and his daughter.     Owens Loffler, MD [facility] Gastroenterology Pager [phone]

## 2016-06-26 NOTE — Progress Notes (Signed)
Patient ID: Eduardo Salazar, male   DOB: 12-08-1925, 83 y.o.   MRN: [id number]  Day of Surgery Subjective: Pt s/p colonoscopy this morning.  Findings suggestive of multiple benign appearing polyps and diverticulitis.  Pt still with right flank pain.  No fever.  Renal function improved.  Pt subjectively breathing better after breathing treatments. According to daughter, he is breathing as well as he does at baseline.  CXR from yesterday without evidence of pneumonia.  Objective: Vital signs in last 24 hours: Temp:  [97.5 F (36.4 C)-98.7 F (37.1 C)] 98.7 F (37.1 C) (04/14 0953) Pulse Rate:  [60-84] 79 (04/14 1035) Resp:  [12-20] 18 (04/14 1035) BP: (94-111)/(36-66) 106/48 (04/14 0953) SpO2:  [93 %-100 %] 93 % (04/14 1035) Weight:  [89 kg (196 lb 3.4 oz)] 89 kg (196 lb 3.4 oz) (04/13 1945)  Intake/Output from previous day: 04/13 0701 - 04/14 0700 In: 3862.5 [P.O.:1000; I.V.:2612.5; IV Piggyback:250] Out: -  Intake/Output this shift: Total I/O In: 120 [P.O.:120] Out: -   Physical Exam:  General: Alert and oriented CV: RRR Lungs: Still with decreased respiratory effort, bilateral wheezes Abd: Severe R CVAT  Lab Results:  Recent Labs  06/25/16 0744 06/26/16 0613  HGB 13.4 12.1*  HCT 39.8 37.1*   CBC    Component Value Date/Time   WBC 7.7 06/26/2016 0613   RBC 4.32 06/26/2016 0613   HGB 12.1 (L) 06/26/2016 0613   HCT 37.1 (L) 06/26/2016 0613   PLT 172 06/26/2016 0613   MCV 85.9 06/26/2016 0613   MCH 28.0 06/26/2016 0613   MCHC 32.6 06/26/2016 0613   RDW 15.1 06/26/2016 0613   LYMPHSABS 1.3 03/05/2015 0747   MONOABS 1.3 (H) 03/05/2015 0747   EOSABS 0.1 03/05/2015 0747   BASOSABS 0.0 03/05/2015 0747     BMET  Recent Labs  06/25/16 0744 06/26/16 0613  NA 138 143  K 3.9 4.1  CL 104 115*  CO2 25 21*  GLUCOSE 125* 104*  BUN 23* 17  CREATININE 1.70* 1.30*  CALCIUM 9.0 8.2*     Studies/Results:  Assessment/Plan: 1) Right ureteral stone with atrophic  left kidney: Fortunately renal function is improved.  UA and other clinical information do not suggest GU infection.  I discussed options with the patient and his wife.  He is very unlikely to pass a 9 mm stone.  Although there is no absolute indication to proceed with urgent intervention, he is at risk of developing worsening renal failure with time considering his obstruction of his right kidney and considering his very atrophic left kidney.  He is subjectively breathing better today and is at his baseline according to his daughter without objective findings to suggest pulmonary infection.  After discussing options, he and his daughter wish to proceed with surgical intervention with right ureteroscopic laser lithotripsy and stent placement.  I discussed the potential benefits and risks of the procedure, side effects of the proposed treatment, the likelihood of the patient achieving the goals of the procedure, and any potential problems that might occur during the procedure or recuperation. Informed consent has been obtained.  Will proceed later today.   LOS: 0 days   Fatma Rutten,LES 06/26/2016, 11:12 AM

## 2016-06-26 NOTE — H&P (View-Only) (Signed)
Urology Consult   Physician requesting consult: Dr. Tyrell Antonio  Reason for consult: Right ureteral stone  History of Present Illness: Eduardo Salazar is a 83 y.o. that was noted to have low-grade fever earlier this week along with symptoms of an upper respiratory infection.  He was started on antibiotics for this purpose.  He has had some shortness of breath and wheezing.  Over the next couple of days, he developed symptoms including abdominal pain in the lower abdomen more on the left side compared to the right.  He also developed nausea and vomiting today.  He therefore presented to the hospital for evaluation the emergency room.  He has denied any hematuria or other voiding complaints.  At baseline, he does take tamsulosin 0.8 mg and is followed by Dr. Kathie Rhodes.  He denies a history of voiding or storage urinary symptoms, hematuria, UTIs, STDs, urolithiasis, GU malignancy/trauma/surgery.  Past Medical History:  Diagnosis Date  . BPH (benign prostatic hyperplasia)   . Hx of radiation therapy 01/15/13- 02/13/13   left temple 5000 cGy 20 sessions  . Hypertension   . Myocardial infarction 2014   mild"no symptoms" was told 2 months ago  . skin ca    top of head; xrt comp    Past Surgical History:  Procedure Laterality Date  . APPENDECTOMY    . CATARACT EXTRACTION, BILATERAL    . CYSTOSCOPY W/ URETERAL STENT PLACEMENT  02/2002   on left for ureteral stone  . CYSTOSCOPY WITH RETROGRADE PYELOGRAM, URETEROSCOPY AND STENT PLACEMENT Left 07/31/2012   Procedure: CYSTOSCOPY WITH LEFT RETROGRADE PYELOGRAM, LEFT URETEROSCOPY;  Surgeon: Claybon Jabs, MD;  Location: [facility];  Service: Urology;  Laterality: Left;  . ESOPHAGOGASTRODUODENOSCOPY N/A 09/14/2012   Procedure: ESOPHAGOGASTRODUODENOSCOPY (EGD);  Surgeon: Jerene Bears, MD;  Location: [facility];  Service: Gastroenterology;  Laterality: N/A;  . ESOPHAGOGASTRODUODENOSCOPY N/A 11/14/2012   Procedure: ESOPHAGOGASTRODUODENOSCOPY (EGD);   Surgeon: Jerene Bears, MD;  Location: [facility];  Service: Gastroenterology;  Laterality: N/A;  . MOHS SURGERY Left 11/28/12   forehead  . SKIN BIOPSY Left 10/27/12   left forehead near frontal scalp  . VASECTOMY    . WRIST SURGERY     "nerve repair"    Current Hospital Medications:  Home Meds:  Current Meds  Medication Sig  . acetaminophen (TYLENOL) 500 MG tablet Take 2 tablets (1,000 mg total) by mouth every 8 (eight) hours as needed for mild pain, fever or headache. (Patient taking differently: Take 500 mg by mouth every 4 (four) hours as needed for mild pain, fever or headache. )  . carvedilol (COREG) 3.125 MG tablet Take 3.125 mg by mouth daily.   Marland Kitchen docusate sodium (COLACE) 100 MG capsule Take 100 mg by mouth daily.  . famotidine (PEPCID) 20 MG tablet Take 20 mg by mouth at bedtime.  . finasteride (PROSCAR) 5 MG tablet Take 5 mg by mouth daily.   . furosemide (LASIX) 20 MG tablet Take 20 mg by mouth daily.  Marland Kitchen guaifenesin (ROBITUSSIN) 100 MG/5ML syrup Take 200 mg by mouth every 6 (six) hours as needed for cough.  Marland Kitchen levofloxacin (LEVAQUIN) 750 MG tablet Take 750 mg by mouth daily.  . polyvinyl alcohol (ARTIFICIAL TEARS) 1.4 % ophthalmic solution Place 1 drop into both eyes 4 (four) times daily.  Marland Kitchen spironolactone (ALDACTONE) 25 MG tablet Take 25 mg by mouth daily.   . tamsulosin (FLOMAX) 0.4 MG CAPS Take 0.4 mg by mouth 2 (two) times daily.   . Vitamin D,  Ergocalciferol, (DRISDOL) 50000 UNITS CAPS capsule Take 50,000 Units by mouth every 7 (seven) days.     Scheduled Meds: . albuterol  2.5 mg Nebulization Q6H  . carvedilol  3.125 mg Oral Daily  . cefTRIAXone (ROCEPHIN)  IV  1 g Intravenous Q24H  . chlorhexidine  15 mL Mouth Rinse BID  . famotidine  20 mg Oral QHS  . finasteride  5 mg Oral Daily  . heparin  5,000 Units Subcutaneous Q8H  . iopamidol      . mouth rinse  15 mL Mouth Rinse q12n4p  . metronidazole  500 mg Intravenous Q8H  . peg 3350 powder  0.5 kit Oral Once    And  . peg 3350 powder  0.5 kit Oral Once  . tamsulosin  0.4 mg Oral BID   Continuous Infusions: . sodium chloride     PRN Meds:.acetaminophen **OR** acetaminophen, acetaminophen, guaiFENesin, nitroGLYCERIN, ondansetron **OR** ondansetron (ZOFRAN) IV  Allergies: No Known Allergies  Family History  Problem Relation Age of Onset  . Heart attack Father   . Arthritis Mother   . Colon cancer Neg Hx     Social History:  reports that he quit smoking about 42 years ago. His smoking use included Cigarettes. He has a 11.00 pack-year smoking history. He has never used smokeless tobacco. He reports that he does not drink alcohol or use drugs.  ROS: A complete review of systems was performed.  All systems are negative except for pertinent findings as noted.  Physical Exam:  Vital signs in last 24 hours: Temp:  [97.5 F (36.4 C)-97.9 F (36.6 C)] 97.5 F (36.4 C) (04/13 1248) Pulse Rate:  [65-85] 65 (04/13 1248) Resp:  [17-20] 19 (04/13 1145) BP: (94-124)/(58-105) 94/58 (04/13 1248) SpO2:  [94 %-96 %] 96 % (04/13 1248) Constitutional:  Alert and oriented, No acute distress Cardiovascular: Regular rate and rhythm, No JVD Respiratory: bilateral wheezes, poor inspiration effort. GI: He does have moderate tenderness on palpation of his right abdomen.  He has minimal tenderness on the left side. GU: Moderate right CVA tenderness  No left CVA tenderness. Lymphatic: No lymphadenopathy Neurologic: Grossly intact, no focal deficits Psychiatric: Normal mood and affect  Laboratory Data:   Recent Labs  06/25/16 0744  WBC 9.8  HGB 13.4  HCT 39.8  PLT 205     Recent Labs  06/25/16 0744  NA 138  K 3.9  CL 104  GLUCOSE 125*  BUN 23*  CALCIUM 9.0  CREATININE 1.70*     Results for orders placed or performed during the hospital encounter of 06/25/16 (from the past 24 hour(s))  CBC     Status: None   Collection Time: 06/25/16  7:44 AM  Result Value Ref Range   WBC 9.8 4.0 - 10.5  K/uL   RBC 4.58 4.22 - 5.81 MIL/uL   Hemoglobin 13.4 13.0 - 17.0 g/dL   HCT 39.8 39.0 - 52.0 %   MCV 86.9 78.0 - 100.0 fL   MCH 29.3 26.0 - 34.0 pg   MCHC 33.7 30.0 - 36.0 g/dL   RDW 15.0 11.5 - 15.5 %   Platelets 205 150 - 400 K/uL  Comprehensive metabolic panel     Status: Abnormal   Collection Time: 06/25/16  7:44 AM  Result Value Ref Range   Sodium 138 135 - 145 mmol/L   Potassium 3.9 3.5 - 5.1 mmol/L   Chloride 104 101 - 111 mmol/L   CO2 25 22 - 32 mmol/L   Glucose, Bld  125 (H) 65 - 99 mg/dL   BUN 23 (H) 6 - 20 mg/dL   Creatinine, Ser 1.70 (H) 0.61 - 1.24 mg/dL   Calcium 9.0 8.9 - 10.3 mg/dL   Total Protein 7.3 6.5 - 8.1 g/dL   Albumin 3.4 (L) 3.5 - 5.0 g/dL   AST 21 15 - 41 U/L   ALT 17 17 - 63 U/L   Alkaline Phosphatase 100 38 - 126 U/L   Total Bilirubin 0.7 0.3 - 1.2 mg/dL   GFR calc non Af Amer 34 (L) >60 mL/min   GFR calc Af Amer 40 (L) >60 mL/min   Anion gap 9 5 - 15  Lipase, blood     Status: None   Collection Time: 06/25/16  7:44 AM  Result Value Ref Range   Lipase 16 11 - 51 U/L  Influenza panel by PCR (type A & B)     Status: None   Collection Time: 06/25/16  7:44 AM  Result Value Ref Range   Influenza A By PCR NEGATIVE NEGATIVE   Influenza B By PCR NEGATIVE NEGATIVE  Troponin I     Status: None   Collection Time: 06/25/16  7:44 AM  Result Value Ref Range   Troponin I <0.03 <0.03 ng/mL   No results found for this or any previous visit (from the past 240 hour(s)).  Renal Function:  Recent Labs  06/25/16 0744  CREATININE 1.70*   CrCl cannot be calculated (Unknown ideal weight.).  Radiologic Imaging: Dg Chest 2 View  Result Date: 06/25/2016 CLINICAL DATA:  Cough, congestion, recent flu symptoms, weakness EXAM: CHEST  2 VIEW COMPARISON:  03/02/2015 FINDINGS: Cardiomediastinal silhouette is stable. No infiltrate or pulmonary edema. Surgical clips upper paraspinal region are again noted. No infiltrate or pulmonary edema. Mild left basilar atelectasis  or scarring. Degenerative changes thoracic spine. IMPRESSION: No active cardiopulmonary disease. Electronically Signed   By: Lahoma Crocker M.D.   On: 06/25/2016 08:10   Ct Abdomen Pelvis W Contrast  Result Date: 06/25/2016 CLINICAL DATA:  Generalized abdominal pain. EXAM: CT ABDOMEN AND PELVIS WITH CONTRAST TECHNIQUE: Multidetector CT imaging of the abdomen and pelvis was performed using the standard protocol following bolus administration of intravenous contrast. CONTRAST:  29m ISOVUE-300 IOPAMIDOL (ISOVUE-300) INJECTION 61% COMPARISON:  Radiograph same day.  CT scan of March 02, 2015. FINDINGS: Lower chest: Bronchiectasis is noted in the lower lobes bilaterally. No acute pulmonary disease is noted. Hepatobiliary: Liver appears normal. Possible sludge seen in the gallbladder lumen. Pancreas: Unremarkable. No pancreatic ductal dilatation or surrounding inflammatory changes. Spleen: Normal in size without focal abnormality. Adrenals/Urinary Tract: Adrenal glands appear normal. Severe left renal atrophy is noted. Nonobstructive calculus is noted in the left kidney. Right kidney appears normal. No hydronephrosis or right ureteral dilatation is noted. However, there is a 9 mm nonobstructive calculus seen in the distal right ureter. Urinary bladder is unremarkable. Stomach/Bowel: Diverticulosis of descending and sigmoid colon is noted without inflammation. There is no evidence of bowel obstruction. Status post appendectomy. Focal thickening of rectosigmoid junction is noted concerning for possible neoplasm. Vascular/Lymphatic: Aortic atherosclerosis. No enlarged abdominal or pelvic lymph nodes. Reproductive: Prostatic calcifications are noted. Prostate gland appears to be normal in size. Other: Small fat containing left inguinal hernia is noted. Musculoskeletal: No acute or significant osseous findings. IMPRESSION: Focal thickening of rectosigmoid junction is noted concerning for possible neoplasm. Sigmoidoscopy is  recommended for further evaluation. Bronchiectasis is noted in both lower lobes. Probable sludge seen within the gallbladder. Severe left  renal atrophy with nonobstructive calculus. No hydronephrosis or ureteral dilatation is noted. However, 9 mm nonobstructive calculus is seen in the distal right ureter. Diverticulosis of descending and sigmoid colon is noted without inflammation. Aortic atherosclerosis. Electronically Signed   By: Marijo Conception, M.D.   On: 06/25/2016 10:35   Dg Abd 2 Views  Result Date: 06/25/2016 CLINICAL DATA:  Acute generalized abdominal pain. EXAM: ABDOMEN - 2 VIEW COMPARISON:  Radiographs of May 26, 2014. FINDINGS: Mildly dilated small bowel loops are noted in the right lower quadrant which may represent ileus or possibly obstruction. No colonic dilatation is noted. Mild amount of stool is noted in the colon. No free air is noted. No definite calculi are noted. IMPRESSION: Mildly dilated small bowel loops are noted in the right lower quadrant which may represent ileus or possibly small bowel obstruction. Follow-up radiographs are recommended. Electronically Signed   By: Marijo Conception, M.D.   On: 06/25/2016 08:11    I independently reviewed the above imaging studies.  Impression/Recommendation Right ureteral calculus:There is concern that he may have multiple etiologies for his symptoms.  With regard to his fever and respiratory symptoms, it appears that he may have been diagnosed with a viral illness earlier this week.  His chest x-ray does not demonstrate objective evidence of pneumonia.  Furthermore, he has findings in his rectosigmoid junction that may be concerning for possible infection/nflammatory process versus malignancy.  Interestingly, he had mostly complained of left-sidedabdominal pain but his exam suggests that he is more tender on the right side today.  He does have a 9 mm distal right ureteral calculus  The likelihood that he will pass this spontaneously is  low but possible.  He is already on alpha blocker therapy.  We discussed options including surgical intervention with ureteroscopy or even just ureteral stent placement.  His urinalysis is currently pending to assess his risk for infected urine.  After reviewing options, he and his daughter would like to proceed with observation at least over the next 24 hours considering the risk of undergoing general anesthesia considering what appears to be a recent upper respiratory infection and considering his advanced age.  This would seem reasonable and the absence of obvious urinary tract infection or systemic signs of worsening infection.  I will plan to reevaluate him.  His urine should be strained in the meantime.  If he does demonstrate systemic signs of infection such as fever or has worsening pain that is uncontrolled, we likely will proceed with proactive intervention with ureteral stent placement.  I also will assess his urinalysis once returned to determine whether stent placement may be recommended at this time.  Riccardo Holeman,LES 06/25/2016, 1:43 PM    Pryor Curia MD   CC: Dr. Tyrell Antonio

## 2016-06-26 NOTE — Op Note (Addendum)
[facility] Patient Name: Eduardo Salazar Procedure Date: 06/26/2016 MRN: [id number] Attending MD: Juanita Craver , MD Date of Birth: May 04, 1925 CSN: [id number] Age: 83 Admit Type: Inpatient Procedure:                Colonoscopy with snare polypectomy x 4. Indications:              Abnormal CT scan; CRC screening for colorectal                            malignant neoplasm. Providers:                Nelwyn Salisbury, MD, Carolynn Comment, RN, [facility], technician. Referring MD:             Sande Brothers, MD Medicines:                Fentanyl 25 micrograms & Midazolam 1 mg IV Complications:            No immediate complications. Estimated Blood Loss:     Estimated blood loss was minimal. Procedure:                Pre-anesthesia assessment: - Prior to the                            procedure, a history and physical was performed,                            and patient medications and allergies were                            reviewed. The patient's tolerance of previous                            anesthesia was also reviewed. The risks and                            benefits of the procedure and the sedation options                            and risks were discussed with the patient. All                            questions were answered, and informed consent was                            obtained. Prior anticoagulants: The patient has                            taken no previous anticoagulant or antiplatelet                            agents. ASA Grade Assessment: IV - A patient with  severe systemic disease that is a constant threat                            to life. After reviewing the risks and benefits,                            the patient was deemed in satisfactory condition to                            undergo the procedure. After obtaining informed                            consent, the colonoscope was  passed under direct                            vision. Throughout the procedure, the patient's                            blood pressure, pulse, and oxygen saturations were                            monitored continuously. The EC-[id number] ([id number])                            scope was introduced through the anus and advanced                            to the the terminal ileum, with identification of                            the appendiceal orifice and IC valve. The                            colonoscopy was extremely difficult due to                            inadequate bowel prep. Completion of the procedure                            was aided by lavage. The patient tolerated the                            procedure well. The quality of the bowel                            preparation was poor. The terminal ileum, the                            ileocecal valve, the appendiceal orifice and the                            rectum were photographed. The bowel preparation  used was MoviPrep. Scope In: 8:40:39 AM Scope Out: 9:06:55 AM Scope Withdrawal Time: 0 hours 20 minutes 55 seconds  Total Procedure Duration: 0 hours 26 minutes 16 seconds  Findings:      Multiple small and large-mouthed diverticula were found in the entire       colon with most predominant changes in the sigmoid colon; there was no       evidence of diverticular bleeding.      A small sessile polyp was found in the sigmoid colon-removed by hot       snare but the polyp was lost in the stool; a large 8-9 mm sessile polyp       was noted in the proximal descending colon; this was removed by a hot       snare x 1-200/20; two 6 mm sessile polyps were removed from the       mid-transverse colon and cecum by cold snare x 2; these polyps were       resected and retrieved.      The terminal ileum appeared normal.      Retroflexion was done but the visualization was limited due to a large        amount of solid stool in the colon. Impression:               - Preparation of the colon was poor.                           - Severe diverticulosis in the entire examined                            colon with extensive changes in the sigmoid colon;                            there was no evidence of diverticular bleeding.                           - Four polyps removed-see description above for                            details.                           - The examined portion of the ileum was normal.                           - There was no evidence of colitis. Moderate Sedation:      Moderate (conscious) sedation was administered by the endoscopy nurse       and supervised by the endoscopist. The following parameter was       monitored: oxygen saturation. Total physician intraservice time was 28       minutes. Recommendation:           - Clear liquid diet today; advance his diet as                            tolerated.                           - Continue  present medications.                           - Await pathology results.                           - No Ibuprofen, Naproxen, or other non-steroidal                            anti-inflammatory drugs for 2 weeks after polyp                            removal.                           - Repeat colonoscopy is not recommended for                            surveillance. Procedure Code(s):        --- Professional ---                           [phone], Colonoscopy, flexible; with removal of                            tumor(s), polyp(s), or other lesion(s) by snare                            technique Diagnosis Code(s):        --- Professional ---                           D12.0, Benign neoplasm of cecum                           D12.3, Benign neoplasm of transverse colon (hepatic                            flexure or splenic flexure)                           R93.3, Abnormal findings on diagnostic imaging of                             other parts of digestive tract                           K57.30, Diverticulosis of large intestine without                            perforation or abscess without bleeding                           Z12.11, Encounter for screening for malignant                            neoplasm of colon  D12.5, Benign neoplasm of sigmoid colon                           D12.4, Benign neoplasm of descending colon CPT copyright 2016 [employer]. All rights reserved. The codes documented in this report are preliminary and upon coder review may  be revised to meet current compliance requirements. Juanita Craver, MD Juanita Craver, MD 06/26/2016 9:38:18 AM This report has been signed electronically. Number of Addenda: 0

## 2016-06-26 NOTE — Progress Notes (Addendum)
PROGRESS NOTE  Eduardo Salazar DGU:[id number] DOB: [birth year] DOA: 06/25/2016 PCP: Sande Brothers, MD  Brief History:  83 year old male with a history of hypertension, coronary artery disease, BPH presenting with a 4 day history of nausea, abdominal pain, and anorexia. Apparently, the patient had a viral illness approximately 4-5 days prior to admission manifested by sore throat and nonproductive cough resulting in poor by mouth intake. Since that time, the patient has developed "new" abdominal pain without any vomiting, diarrhea, fevers, chills. The patient had seen his primary care provider for his upper respiratory illness and placed on an antibiotic (levofloxacin). Apparently outpatient chest x-ray was negative for pneumonia. In the emergency department, CT of abdomen and pelvis showed GB sludge, focal thickening of the rectosigmoid colon concerning for possible neoplasm as well as a nonobstructive right distal ureteral calculus. GI and urology were consulted to assist with management.   Assessment/Plan: Abdominal pain/focal rectosigmoid thickening -His pain may be related to his ureteral stone as well as rectosigmoid thickening and constipation -Appreciate GI consult -06/26/2016 colonoscopy--severe diverticulosis, 4 polyps removed, no evidence of colitis -d/c ceftriaxone and metronidazole  Right ureteral calculus--nonobstructive -Appreciate urology consult -Urinalysis without pyuria -Continue observation for possible need of ureteroscopy and stent placement -Continue tamsulosin  Acute on chronic renal failure--CKD 3  -Secondary to volume depletion  -Continue IV fluid--> improved -Baseline creatinine 1.1-1.4 -Presenting creatinine 1.70  -UA--no pyuria or hematuria -am BMP  Essential hypertension  -Continue carvedilol  -Holding furosemide and spironolactone secondary to renal failure   Acute bronchitis  -Stable on room air  -Symptomatically treatment  -Continue duo nebs   -personally reviewed Chest x-ray-- negative for consolidation   Lower extremity edema and pain -Venous duplex    Disposition Plan:   ALF vs SNF 4/15 or 4/16 Family Communication:   Daughters updated at bedside--4/14--Total time spent 35 minutes.  Greater than 50% spent face to face counseling and coordinating care.   Consultants:  [facility]; Urology  Code Status:  FULL   DVT Prophylaxis:  [place] Heparin    Procedures: As Listed in Progress Note Above  Antibiotics: None    Subjective: Patient denies fevers, chills, headache, chest pain, dyspnea, nausea, vomiting, diarrhea, abdominal pain, dysuria, hematuria, hematochezia, and melena.   Objective: Vitals:   06/25/16 1945 06/25/16 2052 06/26/16 0300 06/26/16 0824  BP:  (!) 102/50 (!) 102/48 (!) 98/52  Pulse:  68 74 61  Resp:  18 18 13   Temp:  97.7 F (36.5 C) 97.5 F (36.4 C) 98.5 F (36.9 C)  TempSrc:  Oral Oral Oral  SpO2:  98% 94% 93%  Weight: 89 kg (196 lb 3.4 oz)     Height: 5\' 7"  (1.702 m)       Intake/Output Summary (Last 24 hours) at 06/26/16 9563 Last data filed at 06/26/16 0200  Gross per 24 hour  Intake           3862.5 ml  Output                0 ml  Net           3862.5 ml   Weight change:  Exam:   General:  Pt is alert, follows commands appropriately, not in acute distress  HEENT: No icterus, No thrush, No neck mass, [place]/AT  Cardiovascular: RRR, S1/S2, no rubs, no gallops  Respiratory: Scattered bilateral rales. No wheezing. Good air movement.  Abdomen: Soft/+BS, non tender, non distended, no guarding  Extremities: 1 + LE edema, No lymphangitis, No petechiae, No rashes, no synovitis   Data Reviewed: I have personally reviewed following labs and imaging studies Basic Metabolic Panel:  Recent Labs Lab 06/25/16 0744 06/26/16 0613  NA 138 143  K 3.9 4.1  CL 104 115*  CO2 25 21*  GLUCOSE 125* 104*  BUN 23* 17  CREATININE 1.70* 1.30*  CALCIUM 9.0 8.2*   Liver Function  Tests:  Recent Labs Lab 06/25/16 0744  AST 21  ALT 17  ALKPHOS 100  BILITOT 0.7  PROT 7.3  ALBUMIN 3.4*    Recent Labs Lab 06/25/16 0744  LIPASE 16   No results for input(s): AMMONIA in the last 168 hours. Coagulation Profile: No results for input(s): INR, PROTIME in the last 168 hours. CBC:  Recent Labs Lab 06/25/16 0744 06/26/16 0613  WBC 9.8 7.7  HGB 13.4 12.1*  HCT 39.8 37.1*  MCV 86.9 85.9  PLT 205 172   Cardiac Enzymes:  Recent Labs Lab 06/25/16 0744  TROPONINI <0.03   BNP: Invalid input(s): POCBNP CBG: No results for input(s): GLUCAP in the last 168 hours. HbA1C: No results for input(s): HGBA1C in the last 72 hours. Urine analysis:    Component Value Date/Time   COLORURINE YELLOW 06/25/2016 1304   APPEARANCEUR CLEAR 06/25/2016 1304   LABSPEC 1.045 (H) 06/25/2016 1304   PHURINE 5.0 06/25/2016 1304   GLUCOSEU NEGATIVE 06/25/2016 1304   HGBUR NEGATIVE 06/25/2016 1304   BILIRUBINUR NEGATIVE 06/25/2016 1304   KETONESUR NEGATIVE 06/25/2016 1304   PROTEINUR NEGATIVE 06/25/2016 1304   UROBILINOGEN 1.0 05/26/2014 1132   NITRITE NEGATIVE 06/25/2016 1304   LEUKOCYTESUR NEGATIVE 06/25/2016 1304   Sepsis Labs: @LABRCNTIP (procalcitonin:4,lacticidven:4) )No results found for this or any previous visit (from the past 240 hour(s)).   Scheduled Meds: . [MAR Hold] albuterol  2.5 mg Nebulization QID  . [MAR Hold] carvedilol  3.125 mg Oral Daily  . [MAR Hold] cefTRIAXone (ROCEPHIN)  IV  1 g Intravenous Q24H  . [MAR Hold] chlorhexidine  15 mL Mouth Rinse BID  . [MAR Hold] famotidine  20 mg Oral QHS  . [MAR Hold] finasteride  5 mg Oral Daily  . [MAR Hold] heparin  5,000 Units Subcutaneous Q8H  . [MAR Hold] mouth rinse  15 mL Mouth Rinse q12n4p  . [MAR Hold] metronidazole  500 mg Intravenous Q8H  . [MAR Hold] tamsulosin  0.4 mg Oral BID   Continuous Infusions: . sodium chloride 75 mL/hr at 06/25/16 1510  . sodium chloride      Procedures/Studies: Dg  Chest 2 View  Result Date: 06/25/2016 CLINICAL DATA:  Cough, congestion, recent flu symptoms, weakness EXAM: CHEST  2 VIEW COMPARISON:  03/02/2015 FINDINGS: Cardiomediastinal silhouette is stable. No infiltrate or pulmonary edema. Surgical clips upper paraspinal region are again noted. No infiltrate or pulmonary edema. Mild left basilar atelectasis or scarring. Degenerative changes thoracic spine. IMPRESSION: No active cardiopulmonary disease. Electronically Signed   By: Lahoma Crocker M.D.   On: 06/25/2016 08:10   Ct Abdomen Pelvis W Contrast  Result Date: 06/25/2016 CLINICAL DATA:  Generalized abdominal pain. EXAM: CT ABDOMEN AND PELVIS WITH CONTRAST TECHNIQUE: Multidetector CT imaging of the abdomen and pelvis was performed using the standard protocol following bolus administration of intravenous contrast. CONTRAST:  73mL ISOVUE-300 IOPAMIDOL (ISOVUE-300) INJECTION 61% COMPARISON:  Radiograph same day.  CT scan of March 02, 2015. FINDINGS: Lower chest: Bronchiectasis is noted in the lower lobes bilaterally. No acute pulmonary disease is noted. Hepatobiliary: Liver appears normal.  Possible sludge seen in the gallbladder lumen. Pancreas: Unremarkable. No pancreatic ductal dilatation or surrounding inflammatory changes. Spleen: Normal in size without focal abnormality. Adrenals/Urinary Tract: Adrenal glands appear normal. Severe left renal atrophy is noted. Nonobstructive calculus is noted in the left kidney. Right kidney appears normal. No hydronephrosis or right ureteral dilatation is noted. However, there is a 9 mm nonobstructive calculus seen in the distal right ureter. Urinary bladder is unremarkable. Stomach/Bowel: Diverticulosis of descending and sigmoid colon is noted without inflammation. There is no evidence of bowel obstruction. Status post appendectomy. Focal thickening of rectosigmoid junction is noted concerning for possible neoplasm. Vascular/Lymphatic: Aortic atherosclerosis. No enlarged  abdominal or pelvic lymph nodes. Reproductive: Prostatic calcifications are noted. Prostate gland appears to be normal in size. Other: Small fat containing left inguinal hernia is noted. Musculoskeletal: No acute or significant osseous findings. IMPRESSION: Focal thickening of rectosigmoid junction is noted concerning for possible neoplasm. Sigmoidoscopy is recommended for further evaluation. Bronchiectasis is noted in both lower lobes. Probable sludge seen within the gallbladder. Severe left renal atrophy with nonobstructive calculus. No hydronephrosis or ureteral dilatation is noted. However, 9 mm nonobstructive calculus is seen in the distal right ureter. Diverticulosis of descending and sigmoid colon is noted without inflammation. Aortic atherosclerosis. Electronically Signed   By: Marijo Conception, M.D.   On: 06/25/2016 10:35   Dg Abd 2 Views  Result Date: 06/25/2016 CLINICAL DATA:  Acute generalized abdominal pain. EXAM: ABDOMEN - 2 VIEW COMPARISON:  Radiographs of May 26, 2014. FINDINGS: Mildly dilated small bowel loops are noted in the right lower quadrant which may represent ileus or possibly obstruction. No colonic dilatation is noted. Mild amount of stool is noted in the colon. No free air is noted. No definite calculi are noted. IMPRESSION: Mildly dilated small bowel loops are noted in the right lower quadrant which may represent ileus or possibly small bowel obstruction. Follow-up radiographs are recommended. Electronically Signed   By: Marijo Conception, M.D.   On: 06/25/2016 08:11    Annaka Cleaver, DO  [facility] Pager [phone]  If 7PM-7AM, please contact night-coverage [email or web address] Password [email or web address] 06/26/2016, 8:32 AM   LOS: 0 days

## 2016-06-26 NOTE — Progress Notes (Signed)
Nebulizer treatment administered /Albuterol, per Dr. Annye Asa

## 2016-06-27 ENCOUNTER — Inpatient Hospital Stay (HOSPITAL_COMMUNITY): Payer: Medicare Other

## 2016-06-27 ENCOUNTER — Encounter (HOSPITAL_COMMUNITY): Payer: Self-pay | Admitting: Gastroenterology

## 2016-06-27 DIAGNOSIS — M79609 Pain in unspecified limb: Secondary | ICD-10-CM

## 2016-06-27 DIAGNOSIS — M7989 Other specified soft tissue disorders: Secondary | ICD-10-CM

## 2016-06-27 LAB — MAGNESIUM: Magnesium: 1.9 mg/dL (ref 1.7–2.4)

## 2016-06-27 LAB — BASIC METABOLIC PANEL
ANION GAP: 8 (ref 5–15)
BUN: 16 mg/dL (ref 6–20)
CO2: 19 mmol/L — ABNORMAL LOW (ref 22–32)
Calcium: 7.7 mg/dL — ABNORMAL LOW (ref 8.9–10.3)
Chloride: 110 mmol/L (ref 101–111)
Creatinine, Ser: 1.23 mg/dL (ref 0.61–1.24)
GFR, EST AFRICAN AMERICAN: 59 mL/min — AB (ref >60)
GFR, EST NON AFRICAN AMERICAN: 51 mL/min — AB (ref >60)
GLUCOSE: 126 mg/dL — AB (ref 65–99)
Potassium: 4.4 mmol/L (ref 3.5–5.1)
Sodium: 137 mmol/L (ref 135–145)

## 2016-06-27 MED ORDER — AMOXICILLIN-POT CLAVULANATE 875-125 MG PO TABS
1.0000 | ORAL_TABLET | Freq: Two times a day (BID) | ORAL | Status: DC
  Administered 2016-06-27: 1 via ORAL
  Filled 2016-06-27: qty 1

## 2016-06-27 MED ORDER — AMOXICILLIN-POT CLAVULANATE 875-125 MG PO TABS: 1.0000 | ORAL_TABLET | Freq: Two times a day (BID) | ORAL | 0 refills | Status: DC

## 2016-06-27 MED ORDER — AMOXICILLIN-POT CLAVULANATE 875-125 MG PO TABS: 1.0000 | ORAL_TABLET | Freq: Two times a day (BID) | ORAL | 0 refills | Status: AC

## 2016-06-27 NOTE — Progress Notes (Signed)
Patient ID: Eduardo Salazar, male   DOB: 1925-12-10, 83 y.o.   MRN: [id number]  1 Day Post-Op Subjective: Pt s/p right ureteroscopic laser lithotripsy and stone removal and ureteral stent placement yesterday.  Doing well.  Minimal pain.  Objective: Vital signs in last 24 hours: Temp:  [97.5 F (36.4 C)-98.7 F (37.1 C)] 97.6 F (36.4 C) (04/15 0443) Pulse Rate:  [40-90] 73 (04/15 0834) Resp:  [12-21] 18 (04/15 0834) BP: (93-123)/(48-72) 114/51 (04/15 0443) SpO2:  [93 %-100 %] 99 % (04/15 0834)  Intake/Output from previous day: 04/14 0701 - 04/15 0700 In: 2585 [P.O.:660; I.V.:1925] Out: 110 [Urine:100; Blood:10] Intake/Output this shift: No intake/output data recorded.  Physical Exam:  General: Alert and oriented Abd: Minimal right CVAT  Lab Results:  Recent Labs  06/25/16 0744 06/26/16 0613  HGB 13.4 12.1*  HCT 39.8 37.1*   BMET  Recent Labs  06/26/16 0613 06/27/16 0616  NA 143 137  K 4.1 4.4  CL 115* 110  CO2 21* 19*  GLUCOSE 104* 126*  BUN 17 16  CREATININE 1.30* 1.23  CALCIUM 8.2* 7.7*     Studies/Results:   Assessment/Plan: Ok to d/c home from urologic standpoint.  He has been instructed to remove his stent on Wednesday.  Will arrange outpatient f/u with Dr. Karsten Salazar.   LOS: 1 day   Eduardo Salazar,LES 06/27/2016, 9:14 AM

## 2016-06-27 NOTE — Discharge Instructions (Signed)
You may remove your stent on Wednesday morning.  Simply pull the string that is taped to your body and the stent will easily come out.  This may be best done in the shower as some urine may come out with the stent.  Usually you will feel relief once the stent is removed, but occasionally patients can develop pain due to residual swelling of the ureter that may temporarily obstruct the kidney.  This can be managed by taking pain medication and it will typically resolve with time.  Please do not hesitate to call if you have pain that is not controlled with your pain medication or does not improved within 24-48 hours.

## 2016-06-27 NOTE — Evaluation (Signed)
Physical Therapy Evaluation Patient Details Name: Eduardo Salazar MRN: [id number] DOB: 26-Jun-1925 Today's Date: 06/27/2016   History of Present Illness  83 y.o. male admitted with abdominal pain/focal rectosigmoid thickening. Pt is now s/p right ureteroscopic laser lithotripsy and stone removal and ureteral stent placement 06/26/16. PMH: MI, HTN, CKD.  Clinical Impression  Pt admitted with above diagnosis. Pt currently with functional limitations due to the deficits listed below (see PT Problem List). Following PT session, the patient's daughter reports that he is moving better than PTA. Both pt and daughter report feeling confident with D/C to home with assistance provided by ALF. Reinforced safety considerations with mobility with pt and daughter who verbalize understanding. Anticipate d/c to home following acute stay. Pt and daughter deny any questions or concerns.       Follow Up Recommendations Home health PT;Supervision - Intermittent    Equipment Recommendations  None recommended by PT    Recommendations for Other Services       Precautions / Restrictions Precautions Precautions: Fall Precaution Comments: ureteral stent Restrictions Weight Bearing Restrictions: No      Mobility  Bed Mobility Overal bed mobility: Needs Assistance Bed Mobility: Supine to Sit     Supine to sit: Mod assist     General bed mobility comments: Pt does not sleep in a bed. Sleeps in lift chair.   Transfers Overall transfer level: Needs assistance Equipment used: 4-wheeled walker Transfers: Sit to/from Stand Sit to Stand: Supervision         General transfer comment: reminder for reaching back for chair and locking brakes of rollator. Daughter reports that he is usually consistent with this at home.   Ambulation/Gait Ambulation/Gait assistance: Supervision Ambulation Distance (Feet): 70 Feet Assistive device: 4-wheeled walker Gait Pattern/deviations: Step-through pattern;Decreased  stride length;Trunk flexed Gait velocity: decreased    General Gait Details: cues for pt to stay close to walker  Stairs            Wheelchair Mobility    Modified Rankin (Stroke Patients Only)       Balance Overall balance assessment: Needs assistance Sitting-balance support: No upper extremity supported Sitting balance-Leahy Scale: Good     Standing balance support: Single extremity supported   Standing balance comment: using single UE support to pull up underpants, using rollator for ambulation                             Pertinent Vitals/Pain Pain Assessment: No/denies pain    Home Living Family/patient expects to be discharged to:: Assisted living               Home Equipment: Walker - 4 wheels (lift chair) Additional Comments: Lives with spouse who is unable to provide assistance.     Prior Function Level of Independence: Needs assistance   Gait / Transfers Assistance Needed: using rollator for ambulation in apartment and to meals.   ADL's / Homemaking Assistance Needed: meals provided by facility, assistance provided with bathing 3 Xwk  Comments: details provided by pt and daughter     Hand Dominance        Extremity/Trunk Assessment   Upper Extremity Assessment Upper Extremity Assessment: Generalized weakness    Lower Extremity Assessment Lower Extremity Assessment: Generalized weakness    Cervical / Trunk Assessment Cervical / Trunk Assessment: Kyphotic  Communication   Communication: HOH  Cognition Arousal/Alertness: Awake/alert Behavior During Therapy: WFL for tasks assessed/performed Overall Cognitive Status: History of cognitive  impairments - at baseline                                 General Comments: mild confusion during discussion regarding PLF      General Comments General comments (skin integrity, edema, etc.): SpO2 98-100% on RA during session. Nursing notified and they requested pt  continue on 1L per [place] following session.     Exercises     Assessment/Plan    PT Assessment Patient needs continued PT services  PT Problem List Decreased strength;Decreased activity tolerance;Decreased balance;Decreased mobility       PT Treatment Interventions DME instruction;Gait training;Functional mobility training;Therapeutic activities;Therapeutic exercise;Neuromuscular re-education;Patient/family education    PT Goals (Current goals can be found in the Care Plan section)  Acute Rehab PT Goals Patient Stated Goal: go home PT Goal Formulation: With patient/family Time For Goal Achievement: 07/04/16 Potential to Achieve Goals: Good    Frequency Min 3X/week   Barriers to discharge        Co-evaluation               End of Session Equipment Utilized During Treatment: Gait belt Activity Tolerance: Patient tolerated treatment well Patient left: in chair;with call bell/phone within reach;with family/visitor present Nurse Communication: Mobility status PT Visit Diagnosis: Muscle weakness (generalized) (M62.81);Unsteadiness on feet (R26.81)    Time: 1131-1200 PT Time Calculation (min) (ACUTE ONLY): 29 min   Charges:   PT Evaluation $PT Eval Moderate Complexity: 1 Procedure PT Treatments $Gait Training: 8-22 mins   PT G Codes:        Cassell Clement, PT, CSCS Pager [phone] Office [place] 06/27/2016, 12:28 PM

## 2016-06-27 NOTE — Progress Notes (Signed)
**  Preliminary report by tech**  Bilateral lower extremity venous duplex completed. There is no evidence of deep or superficial vein thrombosis involving the right and left lower extremities. All visualized vessels appear patent and compressible. There is no evidence of Baker's cysts bilaterally.  06/27/16 8:22 AM Carlos Levering RVT

## 2016-06-27 NOTE — Progress Notes (Addendum)
Pt is medically stable for D/C to [facility].  Pt's daughter will transport pt.  Per admissions coordinator at [facility] pt will go to room 207-B.  CSW sent D/C orders to [name] (admissions person) via the hub.  CSW contacted/spoke to the pt's daughter [name] and they are aware of the D/C plan.  Pt's daughter is in agreement with the plan.  Number for report is: [phone]  Please reconsult with CSW in future if needed.   Alphonse Guild. Bowman Higbie, LCSWA, LCAS

## 2016-06-27 NOTE — Care Management Note (Addendum)
Case Management Note  Patient Details  Name: Eduardo Salazar MRN: [id number] Date of Birth: 14-Jul-1925  Subjective/Objective:    Abdominal pain, right ureteral calculus                Action/Plan: Discharge Planning: AVS reviewed: Scheduled dc back to ALF. CSW following for placement. Notified CSW that [facility] PT was added. Need to updated FL2.   PCP Sande Brothers   Expected Discharge Date:  06/27/16               Expected Discharge Plan:  Assisted Living / Rest Home  In-House Referral:  Clinical Social Work  Discharge planning Services  CM Consult  Post Acute Care Choice:  NA Choice offered to:  NA  DME Arranged:  N/A DME Agency:  NA  HH Arranged:  NA HH Agency:  NA  Status of Service:  Completed, signed off  If discussed at [employer] of Stay Meetings, dates discussed:    Additional Comments:  Erenest Rasher, RN 06/27/2016, 11:30 AM

## 2016-06-27 NOTE — Discharge Summary (Signed)
Physician Discharge Summary  Eduardo Salazar GXQ:[id number] DOB: December 10, 1925 DOA: 06/25/2016  PCP: Sande Brothers, MD  Admit date: 06/25/2016 Discharge date: 06/27/2016  Admitted From: ALF Disposition:  ALF  Recommendations for Outpatient Follow-up:  1. Follow up with PCP in 1-2 weeks 2. Please obtain BMP/CBC in one week   Discharge Condition: Stable CODE STATUS: FULL Diet recommendation: Heart Healthy /soft   Brief/Interim Summary: 83 year old male with a history of hypertension, coronary artery disease, BPH presenting with a 4 day history of nausea, abdominal pain, and anorexia. Apparently, the patient had a viral illness approximately 4-5 days prior to admission manifested by sore throat and nonproductive cough resulting in poor by mouth intake. Since that time, the patient has developed "new" abdominal pain without any vomiting, diarrhea, fevers, chills. The patient had seen his primary care provider for his upper respiratory illness and placed on an antibiotic (levofloxacin). Apparently outpatient chest x-ray was negative for pneumonia. In the emergency department, CT of abdomen and pelvis showed GB sludge, focal thickening of the rectosigmoid colon concerning for possible neoplasm as well as a nonobstructive right distal ureteral calculus. GI and urology were consulted to assist with management.    Discharge Diagnoses:  Abdominal pain/focal rectosigmoid thickening -His pain may be related to his ureteral stone as well as rectosigmoid thickening and constipation -Appreciate GI consult -06/26/2016 colonoscopy--severe diverticulosis, 4 polyps removed, no evidence of colitis -d/c ceftriaxone and metronidazole -abd pain resolved and diet advanced which pt tolerated  Right ureteral calculus--nonobstructive -Appreciate urology consult -06/26/16--Right ureteroscopy and stone removal with stent placement -Urinalysis without pyuria -Continue tamsulosin  Acute on chronic renal  failure--CKD 3  -Secondary to volume depletion  -Continue IV fluid--> improved -Baseline creatinine 1.1-1.4 -Presenting creatinine 1.70  -UA--no pyuria or hematuria -serum creatinine 1.23 at time of d/c  Essential hypertension  -Continue carvedilol  -Holding furosemide and spironolactone secondary to renal failure  -no history of CHF  Acute bronchitis  -Stable on room air  -Symptomatically treatment  -Continue duo nebs  -personally reviewed Chest x-ray-- negative for consolidation  -flutter valve -amox/clav x 4 more days  Lower extremity edema and pain -Venous duplex--neg   Discharge Instructions  Discharge Instructions    Diet - low sodium heart healthy    Complete by:  As directed    Diet - low sodium heart healthy    Complete by:  As directed    Increase activity slowly    Complete by:  As directed    Increase activity slowly    Complete by:  As directed      Allergies as of 06/27/2016   No Known Allergies     Medication List    STOP taking these medications   furosemide 20 MG tablet Commonly known as:  LASIX   levofloxacin 750 MG tablet Commonly known as:  LEVAQUIN   pantoprazole 40 MG tablet Commonly known as:  PROTONIX   polyethylene glycol packet Commonly known as:  MIRALAX / GLYCOLAX   spironolactone 25 MG tablet Commonly known as:  ALDACTONE     TAKE these medications   acetaminophen 500 MG tablet Commonly known as:  TYLENOL Take 2 tablets (1,000 mg total) by mouth every 8 (eight) hours as needed for mild pain, fever or headache. What changed:  how much to take  when to take this   amoxicillin-clavulanate 875-125 MG tablet Commonly known as:  AUGMENTIN Take 1 tablet by mouth every 12 (twelve) hours.   ARTIFICIAL TEARS 1.4 % ophthalmic solution Generic drug:  polyvinyl alcohol Place 1 drop into both eyes 4 (four) times daily.   carvedilol 3.125 MG tablet Commonly known as:  COREG Take 3.125 mg by mouth daily.   docusate  sodium 100 MG capsule Commonly known as:  COLACE Take 100 mg by mouth daily.   famotidine 20 MG tablet Commonly known as:  PEPCID Take 20 mg by mouth at bedtime.   finasteride 5 MG tablet Commonly known as:  PROSCAR Take 5 mg by mouth daily.   guaifenesin 100 MG/5ML syrup Commonly known as:  ROBITUSSIN Take 200 mg by mouth every 6 (six) hours as needed for cough.   nitroGLYCERIN 0.4 MG SL tablet Commonly known as:  NITROSTAT Place 0.4 mg under the tongue every 5 (five) minutes as needed for chest pain.   tamsulosin 0.4 MG Caps capsule Commonly known as:  FLOMAX Take 0.4 mg by mouth 2 (two) times daily.   Vitamin D (Ergocalciferol) 50000 units Caps capsule Commonly known as:  DRISDOL Take 50,000 Units by mouth every 7 (seven) days.      Follow-up Information    Claybon Jabs, MD Follow up.   Specialty:  Urology Why:  Please call office this week to confirm follow up for about 2-3 weeks from now. Contact information: [address] [place] [address] [phone]          No Known Allergies  Consultations:  [facility]   Procedures/Studies: Dg Chest 2 View  Result Date: 06/25/2016 CLINICAL DATA:  Cough, congestion, recent flu symptoms, weakness EXAM: CHEST  2 VIEW COMPARISON:  03/02/2015 FINDINGS: Cardiomediastinal silhouette is stable. No infiltrate or pulmonary edema. Surgical clips upper paraspinal region are again noted. No infiltrate or pulmonary edema. Mild left basilar atelectasis or scarring. Degenerative changes thoracic spine. IMPRESSION: No active cardiopulmonary disease. Electronically Signed   By: Lahoma Crocker M.D.   On: 06/25/2016 08:10   Ct Abdomen Pelvis W Contrast  Result Date: 06/25/2016 CLINICAL DATA:  Generalized abdominal pain. EXAM: CT ABDOMEN AND PELVIS WITH CONTRAST TECHNIQUE: Multidetector CT imaging of the abdomen and pelvis was performed using the standard protocol following bolus administration of intravenous  contrast. CONTRAST:  71mL ISOVUE-300 IOPAMIDOL (ISOVUE-300) INJECTION 61% COMPARISON:  Radiograph same day.  CT scan of March 02, 2015. FINDINGS: Lower chest: Bronchiectasis is noted in the lower lobes bilaterally. No acute pulmonary disease is noted. Hepatobiliary: Liver appears normal. Possible sludge seen in the gallbladder lumen. Pancreas: Unremarkable. No pancreatic ductal dilatation or surrounding inflammatory changes. Spleen: Normal in size without focal abnormality. Adrenals/Urinary Tract: Adrenal glands appear normal. Severe left renal atrophy is noted. Nonobstructive calculus is noted in the left kidney. Right kidney appears normal. No hydronephrosis or right ureteral dilatation is noted. However, there is a 9 mm nonobstructive calculus seen in the distal right ureter. Urinary bladder is unremarkable. Stomach/Bowel: Diverticulosis of descending and sigmoid colon is noted without inflammation. There is no evidence of bowel obstruction. Status post appendectomy. Focal thickening of rectosigmoid junction is noted concerning for possible neoplasm. Vascular/Lymphatic: Aortic atherosclerosis. No enlarged abdominal or pelvic lymph nodes. Reproductive: Prostatic calcifications are noted. Prostate gland appears to be normal in size. Other: Small fat containing left inguinal hernia is noted. Musculoskeletal: No acute or significant osseous findings. IMPRESSION: Focal thickening of rectosigmoid junction is noted concerning for possible neoplasm. Sigmoidoscopy is recommended for further evaluation. Bronchiectasis is noted in both lower lobes. Probable sludge seen within the gallbladder. Severe left renal atrophy with nonobstructive calculus. No hydronephrosis or ureteral dilatation is  noted. However, 9 mm nonobstructive calculus is seen in the distal right ureter. Diverticulosis of descending and sigmoid colon is noted without inflammation. Aortic atherosclerosis. Electronically Signed   By: Marijo Conception, M.D.    On: 06/25/2016 10:35   Dg Abd 2 Views  Result Date: 06/25/2016 CLINICAL DATA:  Acute generalized abdominal pain. EXAM: ABDOMEN - 2 VIEW COMPARISON:  Radiographs of May 26, 2014. FINDINGS: Mildly dilated small bowel loops are noted in the right lower quadrant which may represent ileus or possibly obstruction. No colonic dilatation is noted. Mild amount of stool is noted in the colon. No free air is noted. No definite calculi are noted. IMPRESSION: Mildly dilated small bowel loops are noted in the right lower quadrant which may represent ileus or possibly small bowel obstruction. Follow-up radiographs are recommended. Electronically Signed   By: Marijo Conception, M.D.   On: 06/25/2016 08:11   Dg C-arm 1-60 Min-no Report  Result Date: 06/26/2016 Fluoroscopy was utilized by the requesting physician.  No radiographic interpretation.        Discharge Exam: Vitals:   06/27/16 0443 06/27/16 0834  BP: (!) 114/51   Pulse: 73 73  Resp: 18 18  Temp: 97.6 F (36.4 C)    Vitals:   06/26/16 1921 06/26/16 2051 06/27/16 0443 06/27/16 0834  BP:  (!) 110/51 (!) 114/51   Pulse:  90 73 73  Resp:  18 18 18   Temp:  97.5 F (36.4 C) 97.6 F (36.4 C)   TempSrc:  Oral Oral   SpO2: 97% 99% 99% 99%  Weight:      Height:        General: Pt is alert, awake, not in acute distress Cardiovascular: RRR, S1/S2 +, no rubs, no gallops Respiratory: CTA bilaterally, no wheezing, no rhonchi Abdominal: Soft, NT, ND, bowel sounds + Extremities: no edema, no cyanosis   The results of significant diagnostics from this hospitalization (including imaging, microbiology, ancillary and laboratory) are listed below for reference.    Significant Diagnostic Studies: Dg Chest 2 View  Result Date: 06/25/2016 CLINICAL DATA:  Cough, congestion, recent flu symptoms, weakness EXAM: CHEST  2 VIEW COMPARISON:  03/02/2015 FINDINGS: Cardiomediastinal silhouette is stable. No infiltrate or pulmonary edema. Surgical clips upper  paraspinal region are again noted. No infiltrate or pulmonary edema. Mild left basilar atelectasis or scarring. Degenerative changes thoracic spine. IMPRESSION: No active cardiopulmonary disease. Electronically Signed   By: Lahoma Crocker M.D.   On: 06/25/2016 08:10   Ct Abdomen Pelvis W Contrast  Result Date: 06/25/2016 CLINICAL DATA:  Generalized abdominal pain. EXAM: CT ABDOMEN AND PELVIS WITH CONTRAST TECHNIQUE: Multidetector CT imaging of the abdomen and pelvis was performed using the standard protocol following bolus administration of intravenous contrast. CONTRAST:  19mL ISOVUE-300 IOPAMIDOL (ISOVUE-300) INJECTION 61% COMPARISON:  Radiograph same day.  CT scan of March 02, 2015. FINDINGS: Lower chest: Bronchiectasis is noted in the lower lobes bilaterally. No acute pulmonary disease is noted. Hepatobiliary: Liver appears normal. Possible sludge seen in the gallbladder lumen. Pancreas: Unremarkable. No pancreatic ductal dilatation or surrounding inflammatory changes. Spleen: Normal in size without focal abnormality. Adrenals/Urinary Tract: Adrenal glands appear normal. Severe left renal atrophy is noted. Nonobstructive calculus is noted in the left kidney. Right kidney appears normal. No hydronephrosis or right ureteral dilatation is noted. However, there is a 9 mm nonobstructive calculus seen in the distal right ureter. Urinary bladder is unremarkable. Stomach/Bowel: Diverticulosis of descending and sigmoid colon is noted without inflammation. There is no  evidence of bowel obstruction. Status post appendectomy. Focal thickening of rectosigmoid junction is noted concerning for possible neoplasm. Vascular/Lymphatic: Aortic atherosclerosis. No enlarged abdominal or pelvic lymph nodes. Reproductive: Prostatic calcifications are noted. Prostate gland appears to be normal in size. Other: Small fat containing left inguinal hernia is noted. Musculoskeletal: No acute or significant osseous findings. IMPRESSION:  Focal thickening of rectosigmoid junction is noted concerning for possible neoplasm. Sigmoidoscopy is recommended for further evaluation. Bronchiectasis is noted in both lower lobes. Probable sludge seen within the gallbladder. Severe left renal atrophy with nonobstructive calculus. No hydronephrosis or ureteral dilatation is noted. However, 9 mm nonobstructive calculus is seen in the distal right ureter. Diverticulosis of descending and sigmoid colon is noted without inflammation. Aortic atherosclerosis. Electronically Signed   By: Marijo Conception, M.D.   On: 06/25/2016 10:35   Dg Abd 2 Views  Result Date: 06/25/2016 CLINICAL DATA:  Acute generalized abdominal pain. EXAM: ABDOMEN - 2 VIEW COMPARISON:  Radiographs of May 26, 2014. FINDINGS: Mildly dilated small bowel loops are noted in the right lower quadrant which may represent ileus or possibly obstruction. No colonic dilatation is noted. Mild amount of stool is noted in the colon. No free air is noted. No definite calculi are noted. IMPRESSION: Mildly dilated small bowel loops are noted in the right lower quadrant which may represent ileus or possibly small bowel obstruction. Follow-up radiographs are recommended. Electronically Signed   By: Marijo Conception, M.D.   On: 06/25/2016 08:11   Dg C-arm 1-60 Min-no Report  Result Date: 06/26/2016 Fluoroscopy was utilized by the requesting physician.  No radiographic interpretation.     Microbiology: Recent Results (from the past 240 hour(s))  Urine culture     Status: Abnormal   Collection Time: 06/25/16  1:04 PM  Result Value Ref Range Status   Specimen Description URINE, RANDOM  Final   Special Requests NONE  Final   Culture MULTIPLE SPECIES PRESENT, SUGGEST RECOLLECTION (A)  Final   Report Status 06/26/2016 FINAL  Final     Labs: Basic Metabolic Panel:  Recent Labs Lab 06/25/16 0744 06/26/16 0613 06/27/16 0616  NA 138 143 137  K 3.9 4.1 4.4  CL 104 115* 110  CO2 25 21* 19*    GLUCOSE 125* 104* 126*  BUN 23* 17 16  CREATININE 1.70* 1.30* 1.23  CALCIUM 9.0 8.2* 7.7*  MG  --   --  1.9   Liver Function Tests:  Recent Labs Lab 06/25/16 0744  AST 21  ALT 17  ALKPHOS 100  BILITOT 0.7  PROT 7.3  ALBUMIN 3.4*    Recent Labs Lab 06/25/16 0744  LIPASE 16   No results for input(s): AMMONIA in the last 168 hours. CBC:  Recent Labs Lab 06/25/16 0744 06/26/16 0613  WBC 9.8 7.7  HGB 13.4 12.1*  HCT 39.8 37.1*  MCV 86.9 85.9  PLT 205 172   Cardiac Enzymes:  Recent Labs Lab 06/25/16 0744  TROPONINI <0.03   BNP: Invalid input(s): POCBNP CBG: No results for input(s): GLUCAP in the last 168 hours.  Time coordinating discharge:  Greater than 30 minutes  Signed:  Breshay Ilg, DO [facility] Pager: [phone] 06/27/2016, 9:57 AM

## 2016-06-27 NOTE — Progress Notes (Signed)
Rt gave pt flutter valve. Pt knows and understands how to use. 

## 2016-06-28 ENCOUNTER — Emergency Department (HOSPITAL_COMMUNITY)
Admission: EM | Admit: 2016-06-28 | Discharge: 2016-06-29 | Disposition: A | Discharge status: Home / Self Care | Payer: Medicare Other | Source: Home / Self Care | Attending: Emergency Medicine | Admitting: Emergency Medicine

## 2016-06-28 ENCOUNTER — Encounter (HOSPITAL_COMMUNITY): Payer: Self-pay | Admitting: Urology

## 2016-06-28 DIAGNOSIS — I252 Old myocardial infarction: Secondary | ICD-10-CM

## 2016-06-28 DIAGNOSIS — I129 Hypertensive chronic kidney disease with stage 1 through stage 4 chronic kidney disease, or unspecified chronic kidney disease: Secondary | ICD-10-CM

## 2016-06-28 DIAGNOSIS — R339 Retention of urine, unspecified: Secondary | ICD-10-CM

## 2016-06-28 DIAGNOSIS — Z79899 Other long term (current) drug therapy: Secondary | ICD-10-CM

## 2016-06-28 DIAGNOSIS — Z87891 Personal history of nicotine dependence: Secondary | ICD-10-CM

## 2016-06-28 DIAGNOSIS — R0602 Shortness of breath: Secondary | ICD-10-CM | POA: Diagnosis not present

## 2016-06-28 DIAGNOSIS — N183 Chronic kidney disease, stage 3 (moderate): Secondary | ICD-10-CM

## 2016-06-28 DIAGNOSIS — I130 Hypertensive heart and chronic kidney disease with heart failure and stage 1 through stage 4 chronic kidney disease, or unspecified chronic kidney disease: Secondary | ICD-10-CM | POA: Diagnosis not present

## 2016-06-28 LAB — URINALYSIS, ROUTINE W REFLEX MICROSCOPIC
Bacteria, UA: NONE SEEN
Bilirubin Urine: NEGATIVE
GLUCOSE, UA: 50 mg/dL — AB
Ketones, ur: NEGATIVE mg/dL
NITRITE: NEGATIVE
Protein, ur: 100 mg/dL — AB
SPECIFIC GRAVITY, URINE: 1.015 (ref 1.005–1.030)
Squamous Epithelial / LPF: NONE SEEN
pH: 6 (ref 5.0–8.0)

## 2016-06-28 LAB — CBC WITH DIFFERENTIAL/PLATELET
Basophils Absolute: 0 10*3/uL (ref 0.0–0.1)
Basophils Relative: 0 %
EOS PCT: 2 %
Eosinophils Absolute: 0.2 10*3/uL (ref 0.0–0.7)
HEMATOCRIT: 37.2 % — AB (ref 39.0–52.0)
Hemoglobin: 12.4 g/dL — ABNORMAL LOW (ref 13.0–17.0)
LYMPHS ABS: 2.3 10*3/uL (ref 0.7–4.0)
LYMPHS PCT: 25 %
MCH: 28.8 pg (ref 26.0–34.0)
MCHC: 33.3 g/dL (ref 30.0–36.0)
MCV: 86.5 fL (ref 78.0–100.0)
Monocytes Absolute: 1.1 10*3/uL — ABNORMAL HIGH (ref 0.1–1.0)
Monocytes Relative: 11 %
NEUTROS ABS: 5.9 10*3/uL (ref 1.7–7.7)
Neutrophils Relative %: 62 %
PLATELETS: 172 10*3/uL (ref 150–400)
RBC: 4.3 MIL/uL (ref 4.22–5.81)
RDW: 15.6 % — ABNORMAL HIGH (ref 11.5–15.5)
WBC: 9.4 10*3/uL (ref 4.0–10.5)

## 2016-06-28 LAB — BASIC METABOLIC PANEL
ANION GAP: 6 (ref 5–15)
BUN: 15 mg/dL (ref 6–20)
CHLORIDE: 112 mmol/L — AB (ref 101–111)
CO2: 21 mmol/L — ABNORMAL LOW (ref 22–32)
Calcium: 8.3 mg/dL — ABNORMAL LOW (ref 8.9–10.3)
Creatinine, Ser: 1.28 mg/dL — ABNORMAL HIGH (ref 0.61–1.24)
GFR calc Af Amer: 56 mL/min — ABNORMAL LOW (ref >60)
GFR calc non Af Amer: 48 mL/min — ABNORMAL LOW (ref >60)
GLUCOSE: 122 mg/dL — AB (ref 65–99)
POTASSIUM: 3.9 mmol/L (ref 3.5–5.1)
SODIUM: 139 mmol/L (ref 135–145)

## 2016-06-28 MED ORDER — IPRATROPIUM-ALBUTEROL 0.5-2.5 (3) MG/3ML IN SOLN
3.0000 mL | Freq: Once | RESPIRATORY_TRACT | Status: AC
  Administered 2016-06-28: 3 mL via RESPIRATORY_TRACT
  Filled 2016-06-28: qty 3

## 2016-06-28 MED ORDER — LIDOCAINE HCL 2 % EX GEL
1.0000 "application " | Freq: Once | CUTANEOUS | Status: AC
  Administered 2016-06-28: 1 via URETHRAL
  Filled 2016-06-28: qty 11

## 2016-06-28 NOTE — ED Provider Notes (Signed)
[facility] DEPT Provider Note   CSN: [id number] Arrival date & time: 06/28/16  2002  By signing my name below, I, Margit Banda, attest that this documentation has been prepared under the direction and in the presence of Ocie Cornfield, PA-C. Electronically Signed: Margit Banda, ED Scribe. 06/28/16. 10:52 PM.   History   Chief Complaint Chief Complaint  Patient presents with  . Urinary Retention    HPI Eduardo Salazar is a 83 y.o. male who presents to the Emergency Department complaining of suddenly not being able to urinate since yesterday afternoon (06/27/16). Pt was admitted to the hospital on friday 06/25/16 and discharged yesterday 06/27/16. He was diagnosed with diverticulitis, had a kidney stone removed and had a stent put in. Patient also had acute bronchitis with negative x-ray concerning for pneumonia. He was given duo nebs and started on Augmentin. Per pt's relative, she thinks the stent came out. Patient denies any abdominal pain. Patient has been able to urinate until today. Daughter at bedside reports a nonproductive cough the patient has had since admission with no new symptoms.. BM are normal. Pt denies fever, chills, nausea, vomiting, diarrhea, blood from his penis, altered mental status, abdominal pain, nausea, emesis.   The history is provided by the patient and a relative. No language interpreter was used.    Past Medical History:  Diagnosis Date  . BPH (benign prostatic hyperplasia)   . CKD (chronic kidney disease) stage 3, GFR 30-59 ml/min   . Hx of radiation therapy 01/15/13- 02/13/13   left temple 5000 cGy 20 sessions  . Hypertension   . Myocardial infarction [facility]) 2014   mild"no symptoms" was told 2 months ago  . Pyloric ulcer   . skin ca    top of head; xrt comp    Patient Active Problem List   Diagnosis Date Noted  . Acute renal failure superimposed on stage 3 chronic kidney disease ([place]) 06/26/2016  . Ureteral calculus, right 06/26/2016  .  Dehydration 06/26/2016  . Pressure injury of skin 06/26/2016  . Right ureteral calculus 06/26/2016  . Weakness   . AKI (acute kidney injury) ([place]) 06/25/2016  . Bronchitis 06/25/2016  . Abdominal pain 06/25/2016  . UTI (lower urinary tract infection) 03/05/2015  . Coronary artery disease involving native heart without angina pectoris 03/05/2015  . Benign essential HTN 03/05/2015  . Leukocytosis 03/05/2015  . T12 compression fracture ([place]) 05/26/2014  . BPH (benign prostatic hyperplasia) 05/26/2014  . Squamous cell carcinoma of skin of other and unspecified parts of face 10/27/2012  . CKD (chronic kidney disease) stage 3, GFR 30-59 ml/min 09/13/2012  . Pyloric channel ulcer 09/12/2012    Past Surgical History:  Procedure Laterality Date  . APPENDECTOMY    . CATARACT EXTRACTION, BILATERAL    . COLONOSCOPY WITH PROPOFOL N/A 06/26/2016   Procedure: COLONOSCOPY WITH PROPOFOL;  Surgeon: Juanita Craver, MD;  Location: [facility];  Service: Endoscopy;  Laterality: N/A;  . CYSTOSCOPY W/ URETERAL STENT PLACEMENT  02/2002   on left for ureteral stone  . CYSTOSCOPY WITH RETROGRADE PYELOGRAM, URETEROSCOPY AND STENT PLACEMENT Left 07/31/2012   Procedure: CYSTOSCOPY WITH LEFT RETROGRADE PYELOGRAM, LEFT URETEROSCOPY;  Surgeon: Claybon Jabs, MD;  Location: [facility];  Service: Urology;  Laterality: Left;  . CYSTOSCOPY/RETROGRADE/URETEROSCOPY/STONE EXTRACTION WITH BASKET Right 06/26/2016   Procedure: CYSTOSCOPY/RIGHT RETROGRADE/RIGHT URETEROSCOPY/LASER LITHOTRIPSY WITH STONE EXTRACTION WITH BASKET/RIGHT URETERAL STENT PLACEMENT;  Surgeon: Raynelle Bring, MD;  Location: [facility];  Service: Urology;  Laterality: Right;  . ESOPHAGOGASTRODUODENOSCOPY N/A 09/14/2012  Procedure: ESOPHAGOGASTRODUODENOSCOPY (EGD);  Surgeon: Jerene Bears, MD;  Location: [facility];  Service: Gastroenterology;  Laterality: N/A;  . ESOPHAGOGASTRODUODENOSCOPY N/A 11/14/2012   Procedure: ESOPHAGOGASTRODUODENOSCOPY (EGD);  Surgeon: Jerene Bears, MD;  Location: [facility];  Service: Gastroenterology;  Laterality: N/A;  . HOLMIUM LASER APPLICATION Right 9/56/3875   Procedure: HOLMIUM LASER APPLICATION;  Surgeon: Raynelle Bring, MD;  Location: [facility];  Service: Urology;  Laterality: Right;  . MOHS SURGERY Left 11/28/12   forehead  . SKIN BIOPSY Left 10/27/12   left forehead near frontal scalp  . VASECTOMY    . WRIST SURGERY     "nerve repair"       Home Medications    Prior to Admission medications   Medication Sig Start Date End Date Taking? Authorizing Provider  acetaminophen (TYLENOL) 500 MG tablet Take 500-1,000 mg by mouth every 4 (four) hours as needed for mild pain, moderate pain, fever or headache.   Yes Historical Provider, MD  alum & mag hydroxide-simeth (MAALOX/MYLANTA) 200-200-20 MG/5ML suspension Take 30 mLs by mouth every 6 (six) hours as needed for indigestion or heartburn.   Yes Historical Provider, MD  carvedilol (COREG) 3.125 MG tablet Take 3.125 mg by mouth daily.    Yes Historical Provider, MD  docusate sodium (COLACE) 100 MG capsule Take 100 mg by mouth daily.   Yes Historical Provider, MD  famotidine (PEPCID) 20 MG tablet Take 20 mg by mouth at bedtime.   Yes Historical Provider, MD  finasteride (PROSCAR) 5 MG tablet Take 5 mg by mouth daily.    Yes Historical Provider, MD  guaifenesin (ROBITUSSIN) 100 MG/5ML syrup Take 200 mg by mouth every 6 (six) hours as needed for cough.   Yes Historical Provider, MD  loperamide (IMODIUM) 2 MG capsule Take 2 mg by mouth every 3 (three) hours as needed for diarrhea or loose stools.   Yes Historical Provider, MD  magnesium hydroxide (MILK OF MAGNESIA) 400 MG/5ML suspension Take 30 mLs by mouth at bedtime as needed for mild constipation.   Yes Historical Provider, MD  Neomycin-Bacitracin-Polymyxin (TRIPLE ANTIBIOTIC) [phone] OINT Apply 1 application topically 3 (three) times daily as needed (for skin tears/abrasions).   Yes Historical Provider, MD  nitroGLYCERIN  (NITROSTAT) 0.4 MG SL tablet Place 0.4 mg under the tongue every 5 (five) minutes as needed for chest pain.   Yes Historical Provider, MD  polyvinyl alcohol (ARTIFICIAL TEARS) 1.4 % ophthalmic solution Place 1 drop into both eyes 4 (four) times daily.   Yes Historical Provider, MD  tamsulosin (FLOMAX) 0.4 MG CAPS Take 0.4 mg by mouth 2 (two) times daily.    Yes Historical Provider, MD  Vitamin D, Ergocalciferol, (DRISDOL) 50000 UNITS CAPS capsule Take 50,000 Units by mouth every Sunday.    Yes Historical Provider, MD  amoxicillin-clavulanate (AUGMENTIN) 875-125 MG tablet Take 1 tablet by mouth every 12 (twelve) hours. 06/27/16   Orson Eva, MD    Family History Family History  Problem Relation Age of Onset  . Heart attack Father   . Arthritis Mother   . Colon cancer Neg Hx     Social History Social History  Substance Use Topics  . Smoking status: Former Smoker    Packs/day: 0.50    Years: 22.00    Types: Cigarettes    Quit date: 07/27/1973  . Smokeless tobacco: Never Used  . Alcohol use No     Allergies   Patient has no known allergies.   Review of Systems Review of Systems  Constitutional: Negative for chills and fever.  Respiratory: Positive for cough and wheezing. Negative for shortness of breath.   Cardiovascular: Negative for chest pain.  Gastrointestinal: Negative for abdominal pain, constipation, diarrhea, nausea and vomiting.  Genitourinary: Positive for decreased urine volume and difficulty urinating.  Neurological: Negative for dizziness, syncope, weakness, light-headedness and headaches.     Physical Exam Updated Vital Signs BP 104/79 (BP Location: Left Arm)   Pulse 77   Temp 97.6 F (36.4 C) (Oral)   Resp 18   Ht 5\' 7"  (1.702 m)   Wt 189 lb (85.7 kg)   SpO2 99%   BMI 29.60 kg/m   Physical Exam  Constitutional: He is oriented to person, place, and time. He appears well-developed and well-nourished. No distress.  HENT:  Head: Normocephalic and  atraumatic.  Mouth/Throat: Oropharynx is clear and moist.  Eyes: Conjunctivae are normal. Right eye exhibits no discharge. Left eye exhibits no discharge. No scleral icterus.  Neck: Normal range of motion. Neck supple. No thyromegaly present.  Cardiovascular: Normal rate, regular rhythm, normal heart sounds and intact distal pulses.   Pulmonary/Chest: Effort normal. No respiratory distress. He has wheezes (bilateral). He exhibits no tenderness.  Coarse sounds throughout.   Abdominal: Soft. Bowel sounds are normal. He exhibits no distension. There is tenderness (mild). There is no rebound and no guarding.  Suprapubic tenderness.  Musculoskeletal: Normal range of motion.  Lymphadenopathy:    He has no cervical adenopathy.  Neurological: He is alert and oriented to person, place, and time.  Skin: Skin is warm and dry. Capillary refill takes less than 2 seconds. No pallor.  Psychiatric: He has a normal mood and affect. His behavior is normal.  Nursing note and vitals reviewed.    ED Treatments / Results  DIAGNOSTIC STUDIES: Oxygen Saturation is 99% on RA, normal by my interpretation.   COORDINATION OF CARE: 10:33 PM-Discussed next steps with pt. Pt verbalized understanding and is agreeable with the plan.    Labs (all labs ordered are listed, but only abnormal results are displayed) Labs Reviewed  URINALYSIS, ROUTINE W REFLEX MICROSCOPIC - Abnormal; Notable for the following:       Result Value   APPearance HAZY (*)    Glucose, UA 50 (*)    Hgb urine dipstick LARGE (*)    Protein, ur 100 (*)    Leukocytes, UA TRACE (*)    All other components within normal limits  BASIC METABOLIC PANEL - Abnormal; Notable for the following:    Chloride 112 (*)    CO2 21 (*)    Glucose, Bld 122 (*)    Creatinine, Ser 1.28 (*)    Calcium 8.3 (*)    GFR calc non Af Amer 48 (*)    GFR calc Af Amer 56 (*)    All other components within normal limits  CBC WITH DIFFERENTIAL/PLATELET - Abnormal;  Notable for the following:    Hemoglobin 12.4 (*)    HCT 37.2 (*)    RDW 15.6 (*)    Monocytes Absolute 1.1 (*)    All other components within normal limits  URINE CULTURE    EKG  EKG Interpretation None       Radiology Dg Chest Port 1 View  Result Date: 06/27/2016 CLINICAL DATA:  Cough today.  Dyspnea. EXAM: PORTABLE CHEST 1 VIEW COMPARISON:  Two-view chest x-ray 06/25/2016 FINDINGS: Heart size is normal. There is slight increase in a diffuse interstitial pattern suggesting mild edema superimposed on chronic change. Mild bibasilar  atelectasis is noted. There is no significant airspace consolidation. IMPRESSION: 1. Mild edema superimposed on chronic interstitial change. 2. No significant consolidation. Electronically Signed   By: San Morelle M.D.   On: 06/27/2016 11:45    Procedures Procedures (including critical care time)  Medications Ordered in ED Medications  lidocaine (XYLOCAINE) 2 % jelly 1 application (1 application Urethral Given 06/28/16 2309)  ipratropium-albuterol (DUONEB) 0.5-2.5 (3) MG/3ML nebulizer solution 3 mL (3 mLs Nebulization Given 06/28/16 2304)     Initial Impression / Assessment and Plan / ED Course  I have reviewed the triage vital signs and the nursing notes.  Pertinent labs & imaging results that were available during my care of the patient were reviewed by me and considered in my medical decision making (see chart for details).     Patient presents to the ED with urinary retention patient resents to the ED with complaints of urinary retention. Patient recently discharged from hospital for lithotripsy, bronchitis, diverticulitis. Daughter at bedside states that patient pulled his stent out and has been unable to pass urine today. No states that they were unable to get accurate bladder scan reading. Foley catheter was placed with approximately 150-200 mL urine output. UA consistent with UTI. We'll send for culture. No leukocytosis is noted.  Stable hemoglobin. Creatinine at baseline. All other labs unremarkable. Patient with mild suprapubic tenderness. However on repeat abdominal exam tenderness resolved after insertion of Foley. Patient is afebrile. No tachycardia or hypotension noted. Daughter at bedside states that patient cannot go back to nursing facility with Foley catheter. She is asking that we remove the catheter. Doubt acute urinary retention given that there was only 200 mL of urine return and Foley. No CVA tenderness. Doubt pyelonephritis. Able tolerate by mouth fluids without any difficulties. Patient denies any abdominal pain on reassessment. We'll start patient on antibiotics. Did give a DuoNeb in the ED due to wheezing. Patient with bronchitis and was discharged with breathing treatments at home. No course sounds noted concerning for pneumonia. Recent chest x-ray was normal. Pt was seen and evaluated by Dr. Tomi Bamberger who is agreeable to the above plan. Encouraged daughter to follow-up with the urology this week. Pt is hemodynamically stable, in N6AD, & able to ambulate in the ED. Pain has been managed & has no complaints prior to dc. Pt is comfortable with above plan and is stable for discharge at this time. All questions were answered prior to disposition. Strict return precautions for f/u to the ED were discussed.  Final diagnoses:  Urinary retention    New Prescriptions Discharge Medication List as of 06/29/2016 12:17 AM    START taking these medications   Details  cephALEXin (KEFLEX) 500 MG capsule Take 1 capsule (500 mg total) by mouth 2 (two) times daily., Starting Tue 06/29/2016, Print       I personally performed the services described in this documentation, which was scribed in my presence. The recorded information has been reviewed and is accurate.     Doristine Devoid, PA-C 06/19/2016 [id number]    Dorie Rank, MD 06/13/2016 [phone]

## 2016-06-28 NOTE — ED Notes (Signed)
Asked pt if he could give urine sample, but said he's not able to.

## 2016-06-28 NOTE — ED Triage Notes (Signed)
Pt presents from home after being discharged on 06/27/16 for cystoscopy and stent placement. Pt now states that he has been unable to urinate since being discharge. Unable to obtain an accurate reading from Bladder scanner

## 2016-06-28 NOTE — ED Notes (Signed)
ATTEMPTED TO OBTAIN BLADDER SCAN BUT NO RESULTS WERE SHOWN BUT PT REPORTING PAIN ON PALPATION OF SUPRAPUBIC AREA.

## 2016-06-29 MED ORDER — CEPHALEXIN 500 MG PO CAPS: 500.0000 mg | ORAL_CAPSULE | Freq: Two times a day (BID) | ORAL | 0 refills | Status: AC

## 2016-06-29 NOTE — Discharge Instructions (Signed)
Lab work is reassuring. There is minimal urine in the bladder. Urine may show signs of infection. Urine culture has been sent. We will start on antibiotics for 6 days. Follow-up with his urologist tomorrow.

## 2016-06-30 ENCOUNTER — Emergency Department (HOSPITAL_COMMUNITY): Payer: Medicare Other

## 2016-06-30 ENCOUNTER — Encounter (HOSPITAL_COMMUNITY): Payer: Self-pay

## 2016-06-30 DIAGNOSIS — I252 Old myocardial infarction: Secondary | ICD-10-CM

## 2016-06-30 DIAGNOSIS — Z66 Do not resuscitate: Secondary | ICD-10-CM | POA: Diagnosis present

## 2016-06-30 DIAGNOSIS — N179 Acute kidney failure, unspecified: Secondary | ICD-10-CM | POA: Diagnosis present

## 2016-06-30 DIAGNOSIS — J9601 Acute respiratory failure with hypoxia: Secondary | ICD-10-CM | POA: Diagnosis present

## 2016-06-30 DIAGNOSIS — I959 Hypotension, unspecified: Secondary | ICD-10-CM | POA: Diagnosis not present

## 2016-06-30 DIAGNOSIS — I214 Non-ST elevation (NSTEMI) myocardial infarction: Secondary | ICD-10-CM | POA: Diagnosis present

## 2016-06-30 DIAGNOSIS — L89141 Pressure ulcer of left lower back, stage 1: Secondary | ICD-10-CM | POA: Diagnosis present

## 2016-06-30 DIAGNOSIS — I480 Paroxysmal atrial fibrillation: Secondary | ICD-10-CM | POA: Diagnosis present

## 2016-06-30 DIAGNOSIS — Z8711 Personal history of peptic ulcer disease: Secondary | ICD-10-CM

## 2016-06-30 DIAGNOSIS — Z515 Encounter for palliative care: Secondary | ICD-10-CM | POA: Diagnosis present

## 2016-06-30 DIAGNOSIS — I5033 Acute on chronic diastolic (congestive) heart failure: Secondary | ICD-10-CM | POA: Diagnosis present

## 2016-06-30 DIAGNOSIS — R188 Other ascites: Secondary | ICD-10-CM | POA: Diagnosis present

## 2016-06-30 DIAGNOSIS — Z8249 Family history of ischemic heart disease and other diseases of the circulatory system: Secondary | ICD-10-CM

## 2016-06-30 DIAGNOSIS — N400 Enlarged prostate without lower urinary tract symptoms: Secondary | ICD-10-CM | POA: Diagnosis present

## 2016-06-30 DIAGNOSIS — I130 Hypertensive heart and chronic kidney disease with heart failure and stage 1 through stage 4 chronic kidney disease, or unspecified chronic kidney disease: Secondary | ICD-10-CM | POA: Diagnosis present

## 2016-06-30 DIAGNOSIS — L89131 Pressure ulcer of right lower back, stage 1: Secondary | ICD-10-CM | POA: Diagnosis present

## 2016-06-30 DIAGNOSIS — N201 Calculus of ureter: Secondary | ICD-10-CM | POA: Diagnosis present

## 2016-06-30 DIAGNOSIS — I493 Ventricular premature depolarization: Secondary | ICD-10-CM | POA: Diagnosis present

## 2016-06-30 DIAGNOSIS — E872 Acidosis: Secondary | ICD-10-CM | POA: Diagnosis present

## 2016-06-30 DIAGNOSIS — N183 Chronic kidney disease, stage 3 unspecified: Secondary | ICD-10-CM | POA: Diagnosis present

## 2016-06-30 DIAGNOSIS — D72829 Elevated white blood cell count, unspecified: Secondary | ICD-10-CM | POA: Diagnosis not present

## 2016-06-30 DIAGNOSIS — I472 Ventricular tachycardia: Secondary | ICD-10-CM | POA: Diagnosis present

## 2016-06-30 DIAGNOSIS — I2510 Atherosclerotic heart disease of native coronary artery without angina pectoris: Secondary | ICD-10-CM | POA: Diagnosis present

## 2016-06-30 DIAGNOSIS — R1310 Dysphagia, unspecified: Secondary | ICD-10-CM | POA: Diagnosis present

## 2016-06-30 DIAGNOSIS — Z79899 Other long term (current) drug therapy: Secondary | ICD-10-CM

## 2016-06-30 DIAGNOSIS — Z87442 Personal history of urinary calculi: Secondary | ICD-10-CM | POA: Diagnosis not present

## 2016-06-30 DIAGNOSIS — Z87891 Personal history of nicotine dependence: Secondary | ICD-10-CM

## 2016-06-30 DIAGNOSIS — R059 Cough, unspecified: Secondary | ICD-10-CM

## 2016-06-30 DIAGNOSIS — R05 Cough: Secondary | ICD-10-CM

## 2016-06-30 DIAGNOSIS — R0602 Shortness of breath: Secondary | ICD-10-CM

## 2016-06-30 DIAGNOSIS — R69 Illness, unspecified: Secondary | ICD-10-CM | POA: Diagnosis not present

## 2016-06-30 DIAGNOSIS — R627 Adult failure to thrive: Secondary | ICD-10-CM | POA: Diagnosis present

## 2016-06-30 DIAGNOSIS — I509 Heart failure, unspecified: Secondary | ICD-10-CM

## 2016-06-30 DIAGNOSIS — I5031 Acute diastolic (congestive) heart failure: Secondary | ICD-10-CM | POA: Diagnosis not present

## 2016-06-30 DIAGNOSIS — Z923 Personal history of irradiation: Secondary | ICD-10-CM

## 2016-06-30 DIAGNOSIS — Z8261 Family history of arthritis: Secondary | ICD-10-CM

## 2016-06-30 DIAGNOSIS — J9621 Acute and chronic respiratory failure with hypoxia: Secondary | ICD-10-CM | POA: Diagnosis not present

## 2016-06-30 LAB — COMPREHENSIVE METABOLIC PANEL
ALBUMIN: 2.6 g/dL — AB (ref 3.5–5.0)
ALK PHOS: 83 U/L (ref 38–126)
ALT: 21 U/L (ref 17–63)
AST: 27 U/L (ref 15–41)
Anion gap: 7 (ref 5–15)
BILIRUBIN TOTAL: 1 mg/dL (ref 0.3–1.2)
BUN: 12 mg/dL (ref 6–20)
CALCIUM: 8.4 mg/dL — AB (ref 8.9–10.3)
CO2: 21 mmol/L — AB (ref 22–32)
CREATININE: 1.17 mg/dL (ref 0.61–1.24)
Chloride: 109 mmol/L (ref 101–111)
GFR calc Af Amer: >60 mL/min (ref >60)
GFR calc non Af Amer: 54 mL/min — ABNORMAL LOW (ref >60)
Glucose, Bld: 140 mg/dL — ABNORMAL HIGH (ref 65–99)
Potassium: 4.3 mmol/L (ref 3.5–5.1)
Sodium: 137 mmol/L (ref 135–145)
Total Protein: 5.9 g/dL — ABNORMAL LOW (ref 6.5–8.1)

## 2016-06-30 LAB — URINALYSIS, ROUTINE W REFLEX MICROSCOPIC
Bilirubin Urine: NEGATIVE
GLUCOSE, UA: NEGATIVE mg/dL
Ketones, ur: NEGATIVE mg/dL
NITRITE: NEGATIVE
PH: 5 (ref 5.0–8.0)
Protein, ur: NEGATIVE mg/dL
SPECIFIC GRAVITY, URINE: 1.01 (ref 1.005–1.030)

## 2016-06-30 LAB — CBC WITH DIFFERENTIAL/PLATELET
BASOS ABS: 0 10*3/uL (ref 0.0–0.1)
BASOS PCT: 0 %
EOS PCT: 0 %
Eosinophils Absolute: 0 10*3/uL (ref 0.0–0.7)
HEMATOCRIT: 37.5 % — AB (ref 39.0–52.0)
Hemoglobin: 12.6 g/dL — ABNORMAL LOW (ref 13.0–17.0)
Lymphocytes Relative: 9 %
Lymphs Abs: 1.7 10*3/uL (ref 0.7–4.0)
MCH: 28.8 pg (ref 26.0–34.0)
MCHC: 33.6 g/dL (ref 30.0–36.0)
MCV: 85.8 fL (ref 78.0–100.0)
MONO ABS: 0.6 10*3/uL (ref 0.1–1.0)
MONOS PCT: 3 %
NEUTROS ABS: 16.9 10*3/uL — AB (ref 1.7–7.7)
Neutrophils Relative %: 88 %
PLATELETS: 187 10*3/uL (ref 150–400)
RBC: 4.37 MIL/uL (ref 4.22–5.81)
RDW: 15.9 % — AB (ref 11.5–15.5)
WBC: 19.2 10*3/uL — ABNORMAL HIGH (ref 4.0–10.5)

## 2016-06-30 LAB — URINALYSIS, MICROSCOPIC (REFLEX)

## 2016-06-30 LAB — PROTIME-INR
INR: 1.19
PROTHROMBIN TIME: 15.2 s (ref 11.4–15.2)

## 2016-06-30 LAB — URINE CULTURE: Culture: NO GROWTH

## 2016-06-30 LAB — I-STAT ARTERIAL BLOOD GAS, ED
Acid-base deficit: 3 mmol/L — ABNORMAL HIGH (ref 0.0–2.0)
BICARBONATE: 20.4 mmol/L (ref 20.0–28.0)
O2 Saturation: 98 %
PCO2 ART: 31.7 mmHg — AB (ref 32.0–48.0)
PO2 ART: 100 mmHg (ref 83.0–108.0)
TCO2: 21 mmol/L (ref 0–100)
pH, Arterial: 7.415 (ref 7.350–7.450)

## 2016-06-30 LAB — I-STAT TROPONIN, ED: Troponin i, poc: 0.09 ng/mL (ref 0.00–0.08)

## 2016-06-30 LAB — I-STAT CG4 LACTIC ACID, ED
LACTIC ACID, VENOUS: 2.25 mmol/L — AB (ref 0.5–1.9)
LACTIC ACID, VENOUS: 1.64 mmol/L (ref 0.5–1.9)

## 2016-06-30 LAB — BRAIN NATRIURETIC PEPTIDE: B Natriuretic Peptide: 399.6 pg/mL — ABNORMAL HIGH (ref 0.0–100.0)

## 2016-06-30 MED ORDER — ONDANSETRON HCL 4 MG/2ML IJ SOLN: 4.0000 mg | Freq: Four times a day (QID) | INTRAMUSCULAR | Status: DC | PRN

## 2016-06-30 MED ORDER — ASPIRIN 81 MG PO CHEW
324.0000 mg | CHEWABLE_TABLET | Freq: Once | ORAL | Status: AC
  Administered 2016-06-30: 324 mg via ORAL
  Filled 2016-06-30: qty 4

## 2016-06-30 MED ORDER — CEFEPIME HCL 2 G IJ SOLR
2.0000 g | Freq: Once | INTRAMUSCULAR | Status: AC
  Administered 2016-06-30: 2 g via INTRAVENOUS
  Filled 2016-06-30: qty 2

## 2016-06-30 MED ORDER — IPRATROPIUM-ALBUTEROL 0.5-2.5 (3) MG/3ML IN SOLN: 3.0000 mL | Freq: Four times a day (QID) | RESPIRATORY_TRACT | Status: DC

## 2016-06-30 MED ORDER — OXYCODONE HCL 5 MG PO TABS
5.0000 mg | ORAL_TABLET | ORAL | Status: DC | PRN
  Administered 2016-07-02 – 2016-07-03 (×5): 5 mg via ORAL
  Filled 2016-06-30 (×6): qty 1

## 2016-06-30 MED ORDER — VANCOMYCIN HCL IN DEXTROSE 1-5 GM/200ML-% IV SOLN
1000.0000 mg | Freq: Once | INTRAVENOUS | Status: AC
  Administered 2016-06-30: 1000 mg via INTRAVENOUS
  Filled 2016-06-30: qty 200

## 2016-06-30 MED ORDER — FINASTERIDE 5 MG PO TABS
5.0000 mg | ORAL_TABLET | Freq: Every day | ORAL | Status: DC
  Administered 2016-07-01 – 2016-07-03 (×3): 5 mg via ORAL
  Filled 2016-06-30 (×3): qty 1

## 2016-06-30 MED ORDER — ASPIRIN 300 MG RE SUPP: 300.0000 mg | Freq: Once | RECTAL | Status: DC

## 2016-06-30 MED ORDER — SODIUM CHLORIDE 0.9% FLUSH
3.0000 mL | Freq: Two times a day (BID) | INTRAVENOUS | Status: DC
  Administered 2016-06-30 – 2016-07-04 (×7): 3 mL via INTRAVENOUS

## 2016-06-30 MED ORDER — TAMSULOSIN HCL 0.4 MG PO CAPS
0.4000 mg | ORAL_CAPSULE | Freq: Two times a day (BID) | ORAL | Status: DC
  Administered 2016-06-30 – 2016-07-03 (×5): 0.4 mg via ORAL
  Filled 2016-06-30 (×6): qty 1

## 2016-06-30 MED ORDER — DEXTROSE 5 % IV SOLN: 2.0000 g | INTRAVENOUS | Status: DC

## 2016-06-30 MED ORDER — VANCOMYCIN HCL IN DEXTROSE 750-5 MG/150ML-% IV SOLN
750.0000 mg | Freq: Two times a day (BID) | INTRAVENOUS | Status: DC
  Filled 2016-06-30: qty 150

## 2016-06-30 MED ORDER — ACETAMINOPHEN 650 MG RE SUPP: 650.0000 mg | Freq: Four times a day (QID) | RECTAL | Status: DC | PRN

## 2016-06-30 MED ORDER — FUROSEMIDE 10 MG/ML IJ SOLN
40.0000 mg | Freq: Once | INTRAMUSCULAR | Status: AC
  Administered 2016-06-30: 40 mg via INTRAVENOUS
  Filled 2016-06-30: qty 4

## 2016-06-30 MED ORDER — ONDANSETRON HCL 4 MG PO TABS: 4.0000 mg | ORAL_TABLET | Freq: Four times a day (QID) | ORAL | Status: DC | PRN

## 2016-06-30 MED ORDER — ALBUTEROL SULFATE (2.5 MG/3ML) 0.083% IN NEBU: 2.5000 mg | INHALATION_SOLUTION | RESPIRATORY_TRACT | Status: DC | PRN

## 2016-06-30 MED ORDER — FUROSEMIDE 10 MG/ML IJ SOLN
40.0000 mg | Freq: Two times a day (BID) | INTRAMUSCULAR | Status: DC
  Administered 2016-06-30 – 2016-07-01 (×2): 40 mg via INTRAVENOUS
  Filled 2016-06-30 (×2): qty 4

## 2016-06-30 MED ORDER — IPRATROPIUM-ALBUTEROL 0.5-2.5 (3) MG/3ML IN SOLN
3.0000 mL | Freq: Three times a day (TID) | RESPIRATORY_TRACT | Status: DC
  Administered 2016-07-01 – 2016-07-04 (×8): 3 mL via RESPIRATORY_TRACT
  Filled 2016-06-30 (×10): qty 3

## 2016-06-30 MED ORDER — ORAL CARE MOUTH RINSE: 15.0000 mL | Freq: Two times a day (BID) | OROMUCOSAL | Status: DC

## 2016-06-30 MED ORDER — ACETAMINOPHEN 325 MG PO TABS: 650.0000 mg | ORAL_TABLET | Freq: Four times a day (QID) | ORAL | Status: DC | PRN

## 2016-06-30 MED ORDER — ENOXAPARIN SODIUM 40 MG/0.4ML ~~LOC~~ SOLN
40.0000 mg | SUBCUTANEOUS | Status: DC
  Administered 2016-06-30 – 2016-07-02 (×3): 40 mg via SUBCUTANEOUS
  Filled 2016-06-30 (×3): qty 0.4

## 2016-06-30 MED ORDER — GUAIFENESIN ER 600 MG PO TB12
1200.0000 mg | ORAL_TABLET | Freq: Two times a day (BID) | ORAL | Status: DC
  Administered 2016-06-30 – 2016-07-03 (×5): 1200 mg via ORAL
  Filled 2016-06-30 (×6): qty 2

## 2016-06-30 NOTE — ED Notes (Signed)
Pt tolerating 15L NRB without problems. Pt placed on 6L [place], tolerated that well and then placed on 4L [place], tolerating well at this time. No respiratory distress.

## 2016-06-30 NOTE — ED Provider Notes (Signed)
[facility] DEPT Provider Note   CSN: [id number] Arrival date & time: 07/08/2016  1406     History   Chief Complaint Chief Complaint  Patient presents with  . Respiratory Distress    HPI Eduardo Salazar is a 83 y.o. male.  HPI Patient was discharged from the hospital 06/27/2016 for abdominal pain, acute on chronic renal failure and bronchitis. Patient's Lasix and spironolactone were discontinued due to hypotension. Since leaving the hospital the patient has been getting increasing shortness of breath. Patient denies chest pain. For couple of days he has not been feeling well but has not had focal symptoms. No reported fever. Lower extremity edema has increased. Today the patient became severely short of breath and upon EMS arrival was having significant respiratory distress. He was placed on Cpap and given a DuoNeb and sublingual nitroglycerin. This did significantly improve his symptoms.  Past Medical History:  Diagnosis Date  . BPH (benign prostatic hyperplasia)   . CKD (chronic kidney disease) stage 3, GFR 30-59 ml/min   . Hx of radiation therapy 01/15/13- 02/13/13   left temple 5000 cGy 20 sessions  . Hypertension   . Myocardial infarction [facility]) 2014   mild"no symptoms" was told 2 months ago  . Pyloric ulcer   . skin ca    top of head; xrt comp    Patient Active Problem List   Diagnosis Date Noted  . Acute renal failure superimposed on stage 3 chronic kidney disease ([place]) 06/26/2016  . Ureteral calculus, right 06/26/2016  . Dehydration 06/26/2016  . Pressure injury of skin 06/26/2016  . Right ureteral calculus 06/26/2016  . Weakness   . AKI (acute kidney injury) ([place]) 06/25/2016  . Bronchitis 06/25/2016  . Abdominal pain 06/25/2016  . UTI (lower urinary tract infection) 03/05/2015  . Coronary artery disease involving native heart without angina pectoris 03/05/2015  . Benign essential HTN 03/05/2015  . Leukocytosis 03/05/2015  . T12 compression fracture ([place])  05/26/2014  . BPH (benign prostatic hyperplasia) 05/26/2014  . Squamous cell carcinoma of skin of other and unspecified parts of face 10/27/2012  . CKD (chronic kidney disease) stage 3, GFR 30-59 ml/min 09/13/2012  . Pyloric channel ulcer 09/12/2012    Past Surgical History:  Procedure Laterality Date  . APPENDECTOMY    . CATARACT EXTRACTION, BILATERAL    . COLONOSCOPY WITH PROPOFOL N/A 06/26/2016   Procedure: COLONOSCOPY WITH PROPOFOL;  Surgeon: Juanita Craver, MD;  Location: [facility];  Service: Endoscopy;  Laterality: N/A;  . CYSTOSCOPY W/ URETERAL STENT PLACEMENT  02/2002   on left for ureteral stone  . CYSTOSCOPY WITH RETROGRADE PYELOGRAM, URETEROSCOPY AND STENT PLACEMENT Left 07/31/2012   Procedure: CYSTOSCOPY WITH LEFT RETROGRADE PYELOGRAM, LEFT URETEROSCOPY;  Surgeon: Claybon Jabs, MD;  Location: [facility];  Service: Urology;  Laterality: Left;  . CYSTOSCOPY/RETROGRADE/URETEROSCOPY/STONE EXTRACTION WITH BASKET Right 06/26/2016   Procedure: CYSTOSCOPY/RIGHT RETROGRADE/RIGHT URETEROSCOPY/LASER LITHOTRIPSY WITH STONE EXTRACTION WITH BASKET/RIGHT URETERAL STENT PLACEMENT;  Surgeon: Raynelle Bring, MD;  Location: [facility];  Service: Urology;  Laterality: Right;  . ESOPHAGOGASTRODUODENOSCOPY N/A 09/14/2012   Procedure: ESOPHAGOGASTRODUODENOSCOPY (EGD);  Surgeon: Jerene Bears, MD;  Location: [facility];  Service: Gastroenterology;  Laterality: N/A;  . ESOPHAGOGASTRODUODENOSCOPY N/A 11/14/2012   Procedure: ESOPHAGOGASTRODUODENOSCOPY (EGD);  Surgeon: Jerene Bears, MD;  Location: [facility];  Service: Gastroenterology;  Laterality: N/A;  . HOLMIUM LASER APPLICATION Right 7/35/3299   Procedure: HOLMIUM LASER APPLICATION;  Surgeon: Raynelle Bring, MD;  Location: [facility];  Service: Urology;  Laterality: Right;  .  MOHS SURGERY Left 11/28/12   forehead  . SKIN BIOPSY Left 10/27/12   left forehead near frontal scalp  . VASECTOMY    . WRIST SURGERY     "nerve repair"       Home Medications     Prior to Admission medications   Medication Sig Start Date End Date Taking? Authorizing Provider  acetaminophen (TYLENOL) 500 MG tablet Take 1,000 mg by mouth every 8 (eight) hours as needed (for mild pain, fever, or headache).    Yes Historical Provider, MD  alum & mag hydroxide-simeth (MINTOX) 200-200-20 MG/5ML suspension Take 30 mLs by mouth See admin instructions. AS NEEDED FOR HEARTBURN OR INDIGESTION/NOT TO EXCEED 4 DOSES IN 24 HOURS   Yes Historical Provider, MD  amoxicillin-clavulanate (AUGMENTIN) 875-125 MG tablet Take 1 tablet by mouth every 12 (twelve) hours. Patient taking differently: Take 1 tablet by mouth every 12 (twelve) hours. For 4 days 06/27/16  Yes Orson Eva, MD  carvedilol (COREG) 3.125 MG tablet Take 3.125 mg by mouth daily.    Yes Historical Provider, MD  cephALEXin (KEFLEX) 500 MG capsule Take 1 capsule (500 mg total) by mouth 2 (two) times daily. 06/29/16  Yes Chrissie Noa T Leaphart, PA-C  famotidine (PEPCID) 20 MG tablet Take 20 mg by mouth at bedtime.   Yes Historical Provider, MD  finasteride (PROSCAR) 5 MG tablet Take 5 mg by mouth daily.    Yes Historical Provider, MD  guaifenesin (ROBAFEN) 100 MG/5ML syrup Take 200 mg by mouth every 6 (six) hours as needed for cough. NOT TO EXCEED 4 DOSES IN 24 HOURS   Yes Historical Provider, MD  ipratropium-albuterol (DUONEB) 0.5-2.5 (3) MG/3ML SOLN Take 3 mLs by nebulization 2 (two) times daily. FOR 10 DAYS   Yes Historical Provider, MD  loperamide (IMODIUM) 2 MG capsule Take 2 mg by mouth See admin instructions. WITH EACH LOOSE STOOL AS NEEDED FOR DIARRHEA/NOT TO EXCEED 8 DOES IN 24 HOURS   Yes Historical Provider, MD  magnesium hydroxide (MILK OF MAGNESIA) 400 MG/5ML suspension Take 30 mLs by mouth at bedtime as needed (for constipation).    Yes Historical Provider, MD  Neomycin-Bacitracin-Polymyxin (TRIPLE ANTIBIOTIC) [phone] OINT See admin instructions. FOR SKIN TEARS OR ABRASIONS: CLEAN AREA WITH NORMAL SALINE, APPLY OINTMENT,  COVER WITH BANDAID OR GAUZE AND TAPE. CHANGE AS NEEDED UNTIL HEALED.   Yes Historical Provider, MD  nitroGLYCERIN (NITROSTAT) 0.4 MG SL tablet Place 0.4 mg under the tongue every 5 (five) minutes as needed for chest pain. CALL DOCTOR IS NO RELIEF   Yes Historical Provider, MD  polyvinyl alcohol (ARTIFICIAL TEARS) 1.4 % ophthalmic solution Place 1 drop into both eyes 4 (four) times daily.   Yes Historical Provider, MD  tamsulosin (FLOMAX) 0.4 MG CAPS Take 0.4 mg by mouth 2 (two) times daily.    Yes Historical Provider, MD  Vitamin D, Ergocalciferol, (DRISDOL) 50000 UNITS CAPS capsule Take 50,000 Units by mouth every 7 (seven) days.    Yes Historical Provider, MD  docusate sodium (COLACE) 100 MG capsule Take 100 mg by mouth daily.    Historical Provider, MD    Family History Family History  Problem Relation Age of Onset  . Heart attack Father   . Arthritis Mother   . Colon cancer Neg Hx     Social History Social History  Substance Use Topics  . Smoking status: Former Smoker    Packs/day: 0.50    Years: 22.00    Types: Cigarettes    Quit date: 07/27/1973  .  Smokeless tobacco: Never Used  . Alcohol use No     Allergies   Patient has no known allergies.   Review of Systems Review of Systems 10 Systems reviewed and are negative for acute change except as noted in the HPI.   Physical Exam Updated Vital Signs BP (!) 105/58   Pulse 69   Temp 98.4 F (36.9 C) (Axillary)   Resp 18   SpO2 100%   Physical Exam  Constitutional:  Patient arrives to C Pap in place. He has moderate increased work of breathing. Patient is however alert. He will answer questions through the mask.  HENT:  Mouth/Throat: Oropharynx is clear and moist.  Eyes: EOM are normal.  Cardiovascular:  Irregularly irregular. No gross rub murmur gallop.  Pulmonary/Chest:  Moderate increased work of breathing. Expiratory wheeze and mid lung fields. Diffuse rails.  Abdominal: Soft. He exhibits no distension.  There is no tenderness. There is no guarding.  Musculoskeletal:  2+ pitting edema bilateral feet and lower legs.  Neurological: He is alert. He exhibits normal muscle tone. Coordination normal.  Skin: Skin is warm and dry. There is pallor.  Psychiatric: He has a normal mood and affect.     ED Treatments / Results  Labs (all labs ordered are listed, but only abnormal results are displayed) Labs Reviewed  COMPREHENSIVE METABOLIC PANEL - Abnormal; Notable for the following:       Result Value   CO2 21 (*)    Glucose, Bld 140 (*)    Calcium 8.4 (*)    Total Protein 5.9 (*)    Albumin 2.6 (*)    GFR calc non Af Amer 54 (*)    All other components within normal limits  URINALYSIS, ROUTINE W REFLEX MICROSCOPIC - Abnormal; Notable for the following:    Hgb urine dipstick LARGE (*)    Leukocytes, UA SMALL (*)    All other components within normal limits  URINALYSIS, MICROSCOPIC (REFLEX) - Abnormal; Notable for the following:    Bacteria, UA RARE (*)    Squamous Epithelial / LPF 0-5 (*)    All other components within normal limits  I-STAT CG4 LACTIC ACID, ED - Abnormal; Notable for the following:    Lactic Acid, Venous 2.25 (*)    All other components within normal limits  I-STAT TROPOININ, ED - Abnormal; Notable for the following:    Troponin i, poc 0.09 (*)    All other components within normal limits  I-STAT ARTERIAL BLOOD GAS, ED - Abnormal; Notable for the following:    pCO2 arterial 31.7 (*)    Acid-base deficit 3.0 (*)    All other components within normal limits  PROTIME-INR  CBC WITH DIFFERENTIAL/PLATELET  CBC WITH DIFFERENTIAL/PLATELET  BRAIN NATRIURETIC PEPTIDE    EKG  EKG Interpretation  Date/Time:  Wednesday June 30 2016 14:30:04 EDT Ventricular Rate:  72 PR Interval:    QRS Duration: 92 QT Interval:  424 QTC Calculation: 464 R Axis:   54 Text Interpretation:  Atrial fibrillation Low voltage, extremity and precordial leads Nonspecific T abnormalities,  lateral leads Confirmed by Johnney Killian, MD, Jeannie Done [phone]) on 06/18/2016 3:16:22 PM       Radiology Dg Chest Port 1 View  Result Date: 06/26/2016 CLINICAL DATA:  Shortness of breath. EXAM: PORTABLE CHEST 1 VIEW COMPARISON:  06/27/2016.  06/25/2016. FINDINGS: Surgical clips are noted over the chest. Cardiomegaly. Bilateral pulmonary interstitial prominence noted. Mild CHF cannot be excluded. Mild pneumonitis cannot be excluded. Findings have progressed slightly from prior  exam . Underlying chronic interstitial changes are most likely present. IMPRESSION: Interim slight progression of pulmonary interstitial prominence consistent with slight progression of congestive heart failure with pulmonary interstitial edema. Interstitial pneumonitis cannot be excluded. Electronically Signed   By: Marcello Moores  Register   On: 06/29/2016 14:50    Procedures Procedures (including critical care time) CRITICAL CARE Performed by: Charlesetta Shanks   Total critical care time: 30 minutes  Critical care time was exclusive of separately billable procedures and treating other patients.  Critical care was necessary to treat or prevent imminent or life-threatening deterioration.  Critical care was time spent personally by me on the following activities: development of treatment plan with patient and/or surrogate as well as nursing, discussions with consultants, evaluation of patient's response to treatment, examination of patient, obtaining history from patient or surrogate, ordering and performing treatments and interventions, ordering and review of laboratory studies, ordering and review of radiographic studies, pulse oximetry and re-evaluation of patient's condition. Medications Ordered in ED Medications  aspirin suppository 300 mg (not administered)  furosemide (LASIX) injection 40 mg (40 mg Intravenous Given 07/06/2016 1426)     Initial Impression / Assessment and Plan / ED Course  I have reviewed the triage vital signs  and the nursing notes.  Pertinent labs & imaging results that were available during my care of the patient were reviewed by me and considered in my medical decision making (see chart for details).       Final Clinical Impressions(s) / ED Diagnoses   Final diagnoses:  Acute congestive heart failure, unspecified heart failure type ([place])  Severe comorbid illness  Patient developed significant respiratory distress today requiring noninvasive ventilation. Chest x-ray consistent with congestive heart failure. Patient's Lasix recently discontinued due to hypotension and renal insufficiency. At this time no focal signs of pneumonia on chest x-ray however patient does have leukocytosis and lactic acidosis. He does have cough present during exam. Given these findings, patient will be treated for hospital-acquired pneumonia as well. Patient denies chest pain. There is mild elevation troponin which likely is demand related. Patient was given nitroglycerin by medics and Lasix in the emergency department. Patient has shown improvement. Plan will be for readmission.  New Prescriptions New Prescriptions   No medications on file     Charlesetta Shanks, MD 06/18/2016 [phone]

## 2016-06-30 NOTE — H&P (Signed)
HISTORY AND PHYSICAL       PATIENT DETAILS Name: Eduardo Salazar Age: 83 y.o. Sex: male Date of Birth: Jan 23, 1926 Admit Date: 06/13/2016 GBT:DVVOH,YWVPXT, MD   Patient coming from: Assisted living facility   CHIEF COMPLAINT:  Shortness of breath today Lower extremity edema-in for the past few days.  HPI: Eduardo Salazar is a 83 y.o. male with medical history significant of congestive heart failure, BPH, recent hospitalization from 4/13-4/15-subsequently was discharged to his assisted living facility. He was brought back to the emergency room for evaluation of worsening shortness of breath. Please note, patient is a poor historian, most of this history is obtained after speaking with his daughters at bedside. Apparently, over the past few days he has developed worsening lower extremity edema (his Lasix was discontinued on his most recent discharge). This morning he developed worsening shortness of breath, he was brought to the emergency room, where he was thought to have congestive heart failure and I was subsequently asked to admit this patient for further evaluation and treatment.  Per family-they have noted a cough but this is mostly dry. There is no history of fever. Patient's last bowel movement was yesterday, he is passing flatus.  ED Course:  In the emergency room-he initially required BiPAP support-he was subsequently liberated off the BiPAP to nasal cannula. He was treated with Lasix, empiric antibiotics for concern for healthcare associated pneumonia.  Note: Lives at: ALF Mobility:[facility] Chronic Indwelling Foley:no   REVIEW OF SYSTEMS:  Constitutional:   No  weight loss, night sweats,  Fever  HEENT:    No headaches, Dysphagia,Tooth/dental problems,Sore throat,   Cardio-vascular: No chest pain,Orthopnea, PND,lower extremity edema, anasarca, palpitations  GI:  No heartburn, indigestion, abdominal pain, nausea, vomiting, diarrhea, melena or  hematochezia  Resp: No hemoptysis,plueritic chest pain.   Skin:  No rash or lesions.  GU:  No dysuria, change in color of urine, no urgency or frequency.  No flank pain.  Musculoskeletal: No joint pain or swelling.  No decreased range of motion.  No back pain.  Endocrine: No heat intolerance, no cold intolerance, no polyuria, no polydipsia  Psych: No change in mood or affect. No depression or anxiety.  No memory loss.   ALLERGIES:  No Known Allergies  PAST MEDICAL HISTORY: Past Medical History:  Diagnosis Date  . BPH (benign prostatic hyperplasia)   . CKD (chronic kidney disease) stage 3, GFR 30-59 ml/min   . Hx of radiation therapy 01/15/13- 02/13/13   left temple 5000 cGy 20 sessions  . Hypertension   . Myocardial infarction [facility]) 2014   mild"no symptoms" was told 2 months ago  . Pyloric ulcer   . skin ca    top of head; xrt comp    PAST SURGICAL HISTORY: Past Surgical History:  Procedure Laterality Date  . APPENDECTOMY    . CATARACT EXTRACTION, BILATERAL    . COLONOSCOPY WITH PROPOFOL N/A 06/26/2016   Procedure: COLONOSCOPY WITH PROPOFOL;  Surgeon: Juanita Craver, MD;  Location: [facility];  Service: Endoscopy;  Laterality: N/A;  . CYSTOSCOPY W/ URETERAL STENT PLACEMENT  02/2002   on left for ureteral stone  . CYSTOSCOPY WITH RETROGRADE PYELOGRAM, URETEROSCOPY AND STENT PLACEMENT Left 07/31/2012   Procedure: CYSTOSCOPY WITH LEFT RETROGRADE PYELOGRAM, LEFT URETEROSCOPY;  Surgeon: Claybon Jabs, MD;  Location: [facility];  Service: Urology;  Laterality: Left;  . CYSTOSCOPY/RETROGRADE/URETEROSCOPY/STONE EXTRACTION WITH BASKET Right 06/26/2016   Procedure: CYSTOSCOPY/RIGHT RETROGRADE/RIGHT URETEROSCOPY/LASER LITHOTRIPSY WITH STONE EXTRACTION WITH  BASKET/RIGHT URETERAL STENT PLACEMENT;  Surgeon: Raynelle Bring, MD;  Location: [facility];  Service: Urology;  Laterality: Right;  . ESOPHAGOGASTRODUODENOSCOPY N/A 09/14/2012   Procedure: ESOPHAGOGASTRODUODENOSCOPY (EGD);  Surgeon: Jerene Bears, MD;  Location: [facility];  Service: Gastroenterology;  Laterality: N/A;  . ESOPHAGOGASTRODUODENOSCOPY N/A 11/14/2012   Procedure: ESOPHAGOGASTRODUODENOSCOPY (EGD);  Surgeon: Jerene Bears, MD;  Location: [facility];  Service: Gastroenterology;  Laterality: N/A;  . HOLMIUM LASER APPLICATION Right 4/58/0998   Procedure: HOLMIUM LASER APPLICATION;  Surgeon: Raynelle Bring, MD;  Location: [facility];  Service: Urology;  Laterality: Right;  . MOHS SURGERY Left 11/28/12   forehead  . SKIN BIOPSY Left 10/27/12   left forehead near frontal scalp  . VASECTOMY    . WRIST SURGERY     "nerve repair"    MEDICATIONS AT HOME: Prior to Admission medications   Medication Sig Start Date End Date Taking? Authorizing Provider  acetaminophen (TYLENOL) 500 MG tablet Take 1,000 mg by mouth every 8 (eight) hours as needed (for mild pain, fever, or headache).    Yes Historical Provider, MD  alum & mag hydroxide-simeth (MINTOX) 200-200-20 MG/5ML suspension Take 30 mLs by mouth See admin instructions. AS NEEDED FOR HEARTBURN OR INDIGESTION/NOT TO EXCEED 4 DOSES IN 24 HOURS   Yes Historical Provider, MD  amoxicillin-clavulanate (AUGMENTIN) 875-125 MG tablet Take 1 tablet by mouth every 12 (twelve) hours. Patient taking differently: Take 1 tablet by mouth every 12 (twelve) hours. For 4 days 06/27/16  Yes Orson Eva, MD  carvedilol (COREG) 3.125 MG tablet Take 3.125 mg by mouth daily.    Yes Historical Provider, MD  cephALEXin (KEFLEX) 500 MG capsule Take 1 capsule (500 mg total) by mouth 2 (two) times daily. 06/29/16  Yes Doristine Devoid, PA-C  docusate sodium (COLACE) 100 MG capsule Take 100 mg by mouth daily.   Yes Historical Provider, MD  famotidine (PEPCID) 20 MG tablet Take 20 mg by mouth at bedtime.   Yes Historical Provider, MD  finasteride (PROSCAR) 5 MG tablet Take 5 mg by mouth daily.    Yes Historical Provider, MD  guaifenesin (ROBAFEN) 100 MG/5ML syrup Take 200 mg by mouth every 6 (six) hours as needed  for cough. NOT TO EXCEED 4 DOSES IN 24 HOURS   Yes Historical Provider, MD  ipratropium-albuterol (DUONEB) 0.5-2.5 (3) MG/3ML SOLN Take 3 mLs by nebulization 2 (two) times daily. FOR 10 DAYS   Yes Historical Provider, MD  loperamide (IMODIUM) 2 MG capsule Take 2 mg by mouth See admin instructions. WITH EACH LOOSE STOOL AS NEEDED FOR DIARRHEA/NOT TO EXCEED 8 DOES IN 24 HOURS   Yes Historical Provider, MD  magnesium hydroxide (MILK OF MAGNESIA) 400 MG/5ML suspension Take 30 mLs by mouth at bedtime as needed (for constipation).    Yes Historical Provider, MD  Neomycin-Bacitracin-Polymyxin (TRIPLE ANTIBIOTIC) [phone] OINT See admin instructions. FOR SKIN TEARS OR ABRASIONS: CLEAN AREA WITH NORMAL SALINE, APPLY OINTMENT, COVER WITH BANDAID OR GAUZE AND TAPE. CHANGE AS NEEDED UNTIL HEALED.   Yes Historical Provider, MD  nitroGLYCERIN (NITROSTAT) 0.4 MG SL tablet Place 0.4 mg under the tongue every 5 (five) minutes as needed for chest pain. CALL DOCTOR IS NO RELIEF   Yes Historical Provider, MD  polyvinyl alcohol (ARTIFICIAL TEARS) 1.4 % ophthalmic solution Place 1 drop into both eyes 4 (four) times daily.   Yes Historical Provider, MD  tamsulosin (FLOMAX) 0.4 MG CAPS Take 0.4 mg by mouth 2 (two) times daily.    Yes Historical Provider,  MD  Vitamin D, Ergocalciferol, (DRISDOL) 50000 UNITS CAPS capsule Take 50,000 Units by mouth every 7 (seven) days.    Yes Historical Provider, MD    FAMILY HISTORY: Family History  Problem Relation Age of Onset  . Heart attack Father   . Arthritis Mother   . Colon cancer Neg Hx    SOCIAL HISTORY:  reports that he quit smoking about 42 years ago. His smoking use included Cigarettes. He has a 11.00 pack-year smoking history. He has never used smokeless tobacco. He reports that he does not drink alcohol or use drugs.  PHYSICAL EXAM: Blood pressure 110/60, pulse 80, temperature 98.4 F (36.9 C), temperature source Axillary, resp. rate 19, height 5\' 7"  (1.702 m),  weight 85.7 kg (189 lb), SpO2 98 %.  General appearance :Awake, alert, Appears very frail and weak. Speech is slow but clear. Eyes:, pupils equally reactive to light and accomodation,no scleral icterus.Pink conjunctiva HEENT: Atraumatic and Normocephalic Neck: supple, no JVD. No cervical lymphadenopathy. Resp:Good air entry bilaterally, has bibasilar rales, and some scattered rhonchi throughout his lungs. CVS: S1 S2 regular GI: Bowel sounds present, Non tender and not distended with no gaurding, rigidity or rebound.No organomegaly Extremities: B/L Lower Ext shows no edema, both legs are warm to touch Neurology:  speech clear,Non focal, sensation is grossly intact. Psychiatric: Normal judgment and insight. Alert and oriented x 3. Normal mood. Musculoskeletal:No digital cyanosis Skin:No Rash, warm and dry Wounds:N/A  LABS ON ADMISSION:  I have personally reviewed following labs and imaging studies  CBC:  Recent Labs Lab 06/25/16 0744 06/26/16 0613 06/28/16 2250 07/07/2016 1528  WBC 9.8 7.7 9.4 19.2*  NEUTROABS  --   --  5.9 16.9*  HGB 13.4 12.1* 12.4* 12.6*  HCT 39.8 37.1* 37.2* 37.5*  MCV 86.9 85.9 86.5 85.8  PLT 205 172 172 527    Basic Metabolic Panel:  Recent Labs Lab 06/25/16 0744 06/26/16 0613 06/27/16 0616 06/28/16 2250 07/10/2016 1422  NA 138 143 137 139 137  K 3.9 4.1 4.4 3.9 4.3  CL 104 115* 110 112* 109  CO2 25 21* 19* 21* 21*  GLUCOSE 125* 104* 126* 122* 140*  BUN 23* 17 16 15 12   CREATININE 1.70* 1.30* 1.23 1.28* 1.17  CALCIUM 9.0 8.2* 7.7* 8.3* 8.4*  MG  --   --  1.9  --   --     GFR: Estimated Creatinine Clearance: 45.6 mL/min (by C-G formula based on SCr of 1.17 mg/dL).  Liver Function Tests:  Recent Labs Lab 06/25/16 0744 06/29/2016 1422  AST 21 27  ALT 17 21  ALKPHOS 100 83  BILITOT 0.7 1.0  PROT 7.3 5.9*  ALBUMIN 3.4* 2.6*    Recent Labs Lab 06/25/16 0744  LIPASE 16   No results for input(s): AMMONIA in the last 168  hours.  Coagulation Profile:  Recent Labs Lab 07/03/2016 1422  INR 1.19    Cardiac Enzymes:  Recent Labs Lab 06/25/16 0744  TROPONINI <0.03    BNP (last 3 results) No results for input(s): PROBNP in the last 8760 hours.  HbA1C: No results for input(s): HGBA1C in the last 72 hours.  CBG: No results for input(s): GLUCAP in the last 168 hours.  Lipid Profile: No results for input(s): CHOL, HDL, LDLCALC, TRIG, CHOLHDL, LDLDIRECT in the last 72 hours.  Thyroid Function Tests: No results for input(s): TSH, T4TOTAL, FREET4, T3FREE, THYROIDAB in the last 72 hours.  Anemia Panel: No results for input(s): VITAMINB12, FOLATE, FERRITIN, TIBC, IRON, RETICCTPCT  in the last 72 hours.  Urine analysis:    Component Value Date/Time   COLORURINE YELLOW 06/13/2016 [place] 06/27/2016 1431   LABSPEC 1.010 07/02/2016 1431   PHURINE 5.0 06/21/2016 1431   GLUCOSEU NEGATIVE 06/17/2016 1431   HGBUR LARGE (A) 07/04/2016 1431   BILIRUBINUR NEGATIVE 06/26/2016 1431   KETONESUR NEGATIVE 06/29/2016 1431   PROTEINUR NEGATIVE 07/11/2016 1431   UROBILINOGEN 1.0 05/26/2014 1132   NITRITE NEGATIVE 06/17/2016 1431   LEUKOCYTESUR SMALL (A) 07/09/2016 1431    Sepsis Labs: Lactic Acid, Venous    Component Value Date/Time   LATICACIDVEN 1.64 07/01/2016 1705     Microbiology: Recent Results (from the past 240 hour(s))  Urine culture     Status: Abnormal   Collection Time: 06/25/16  1:04 PM  Result Value Ref Range Status   Specimen Description URINE, RANDOM  Final   Special Requests NONE  Final   Culture MULTIPLE SPECIES PRESENT, SUGGEST RECOLLECTION (A)  Final   Report Status 06/26/2016 FINAL  Final  Urine culture     Status: None   Collection Time: 06/28/16 11:06 PM  Result Value Ref Range Status   Specimen Description URINE, CATHETERIZED  Final   Special Requests NONE  Final   Culture   Final    NO GROWTH Performed at [facility], [place] [address],  [place], [place] [address]    Report Status 06/29/2016 FINAL  Final      RADIOLOGIC STUDIES ON ADMISSION: Dg Chest Port 1 View  Result Date: 07/11/2016 CLINICAL DATA:  Shortness of breath. EXAM: PORTABLE CHEST 1 VIEW COMPARISON:  06/27/2016.  06/25/2016. FINDINGS: Surgical clips are noted over the chest. Cardiomegaly. Bilateral pulmonary interstitial prominence noted. Mild CHF cannot be excluded. Mild pneumonitis cannot be excluded. Findings have progressed slightly from prior exam . Underlying chronic interstitial changes are most likely present. IMPRESSION: Interim slight progression of pulmonary interstitial prominence consistent with slight progression of congestive heart failure with pulmonary interstitial edema. Interstitial pneumonitis cannot be excluded. Electronically Signed   By: Marcello Moores  Register   On: 06/29/2016 14:50    I have personally reviewed images of chest xray   EKG:  Personally reviewed. ?A. fib-artifacts.  ASSESSMENT AND PLAN: Acute hypoxic respiratory failure: I suspect that this is secondary to acute/decompensated CHF. Monitor and stepdown-keep BiPAP at bedside, continue intravenous diuretics. Repeat chest x-ray tomorrow morning.  Probable acute diastolic heart failure: No prior echocardiogram in the system-he is significantly volume overloaded on exam. Start Lasix, hold Coreg for now as blood pressure soft. Check echo-he follows with Dr. Einar Gip, who I will discuss with over the phone  Leukocytosis: No evidence of any infection at this time-chest x-ray does not show any overt pneumonia, UA without UTI. Has already been given empiric vancomycin and meropenem on admission-for now I would hold further antibiotics, and an attempt to diurese this patient. Repeat chest x-ray tomorrow morning, follow blood cultures. If he becomes febrile, or worsens, we will resume antimicrobial therapy.  ? Atrial fibrillation: EKG poor quality-with a lot of artifacts-repeat 12-lead EKG. If A. fib  confirmed, probably will require initiation of anticoagulation. He will need to be evaluated by physical therapy-he may not be a good long-term anticoagulation candidate due to fall risk due to significant deconditioning.  Hypertension: Appears controlled-holding carvedilol-continue Lasix.  History of right ureteral calculus-underwent ureterocystoscopy and stone removal on 4/14.  BPH: Continue Flomax/finasteride-we will asked RN to do periodic bladder scans.   Further plan will depend as  patient's clinical course evolves and further radiologic and laboratory data become available. Patient will be monitored closely.  Above noted plan was discussed with patient/daughter face to face at bedside, they were in agreement.   CONSULTS: None  DVT Prophylaxis: Prophylactic Lovenox   Code Status: Full Code  Disposition Plan:  Discharge back to ALF vs SNF possibly in in 3-4 days, depending on clinical course  Admission status:Inpatient  going to  SDU  The medical decision making on this patient was of high complexity and the patient is at high risk for clinical deterioration, therefore this is a level 3 visit.  Total time spent  55 minutes.Greater than 50% of this time was spent in counseling, explanation of diagnosis, planning of further management, and coordination of care.  Oren Binet [facility] Pager [phone]  If 7PM-7AM, please contact night-coverage [email or web address] Password [facility] 06/21/2016, 5:56 PM

## 2016-06-30 NOTE — ED Notes (Addendum)
Trial off BIPAP intiated. NRB mask applied at 15L. 100% O2, RR 17. Pt stable and appears comfortable, NAD. Skin warm/dry. Respiratory tech at bedside for trial off bipap.

## 2016-06-30 NOTE — ED Notes (Signed)
Pt being placed on BIPAP at this time by respiratory tech. Family also at bedside.

## 2016-06-30 NOTE — Progress Notes (Signed)
PT on NIV, Servo-I, PC 8, 40% FIO2.  PT tolerating well.

## 2016-06-30 NOTE — ED Notes (Signed)
Admitting MD at bedside.

## 2016-06-30 NOTE — ED Triage Notes (Signed)
PER EMS: pt from [facility] with c/o respiratory distress/SOB for a couple of days, not feeling well, trouble sleeping. Abdomen and LEs appear swollen; has not had lasix since Monday. 94% on NRB placed by fire dept prior to EMS arrival. Rales initially, CPAP initiated by Ems and then wheezing noted and one duoneb given, 125 solumedrol and 3 nitro SL given en route. Pt A&OX4, BP- 115/70, HR-70 afib, 97% CPAP.

## 2016-06-30 NOTE — Progress Notes (Addendum)
Pharmacy Antibiotic Note  Eduardo Salazar is a 83 y.o. male admitted on 06/14/2016 with pneumonia.  Pharmacy has been consulted for vancomycin and cefepime dosing.  One time doses ordered by EDP- vancomycin 1g IV and cefepime 2g IV.   Plan: Vancomycin 750mg  IV every 12 hours.  Goal trough 15-20 mcg/mL.  Cefepime 2g IV q24h Follow c/s, clinical progression, renal function, trough at steady state     Temp (24hrs), Avg:98.4 F (36.9 C), Min:98.4 F (36.9 C), Max:98.4 F (36.9 C)   Recent Labs Lab 06/25/16 0744 06/26/16 0613 06/27/16 0616 06/28/16 2250 06/21/2016 1422 06/16/2016 1445 07/01/2016 1528  WBC 9.8 7.7  --  9.4  --   --  19.2*  CREATININE 1.70* 1.30* 1.23 1.28* 1.17  --   --   LATICACIDVEN  --   --   --   --   --  2.25*  --     Estimated Creatinine Clearance: 45.6 mL/min (by C-G formula based on SCr of 1.17 mg/dL).    No Known Allergies  Antimicrobials this admission: Vancomycin 4/18 >>  Cefepime 4/18 >>   Dose adjustments this admission: n/a  Microbiology results: 4/18 BCx:    Thank you for allowing pharmacy to be a part of this patient's care.  Aldred Mase D. Tagg Eustice, PharmD, BCPS Clinical Pharmacist Pager: [phone] 07/01/2016 4:29 PM

## 2016-06-30 NOTE — ED Provider Notes (Signed)
The pt has been admitted to the hospitalist - appreciate Dr. Sloan Leiter and the hospitatist service.    Noemi Chapel, MD 07/07/2016 [phone]

## 2016-06-30 NOTE — ED Notes (Signed)
Lab called to inform RN that CBC with diff and BNP have clotted and need to be re-ordered and re-collected.

## 2016-06-30 NOTE — ED Notes (Signed)
After patient given aspirin, pt appeared to cough strongly with garbling speech. Pt was able to finally swallow the water and able to cough and speak in clear sentences. Pt in NAD at this time

## 2016-07-01 ENCOUNTER — Inpatient Hospital Stay (HOSPITAL_COMMUNITY): Payer: Medicare Other

## 2016-07-01 DIAGNOSIS — I509 Heart failure, unspecified: Secondary | ICD-10-CM

## 2016-07-01 LAB — BASIC METABOLIC PANEL
Anion gap: 6 (ref 5–15)
BUN: 17 mg/dL (ref 6–20)
CHLORIDE: 106 mmol/L (ref 101–111)
CO2: 24 mmol/L (ref 22–32)
CREATININE: 1.34 mg/dL — AB (ref 0.61–1.24)
Calcium: 8.2 mg/dL — ABNORMAL LOW (ref 8.9–10.3)
GFR calc Af Amer: 53 mL/min — ABNORMAL LOW (ref >60)
GFR calc non Af Amer: 46 mL/min — ABNORMAL LOW (ref >60)
GLUCOSE: 161 mg/dL — AB (ref 65–99)
Potassium: 3.9 mmol/L (ref 3.5–5.1)
Sodium: 136 mmol/L (ref 135–145)

## 2016-07-01 LAB — CBC
HCT: 35 % — ABNORMAL LOW (ref 39.0–52.0)
Hemoglobin: 11.4 g/dL — ABNORMAL LOW (ref 13.0–17.0)
MCH: 28.1 pg (ref 26.0–34.0)
MCHC: 32.6 g/dL (ref 30.0–36.0)
MCV: 86.2 fL (ref 78.0–100.0)
PLATELETS: 176 10*3/uL (ref 150–400)
RBC: 4.06 MIL/uL — AB (ref 4.22–5.81)
RDW: 15.8 % — AB (ref 11.5–15.5)
WBC: 14.4 10*3/uL — ABNORMAL HIGH (ref 4.0–10.5)

## 2016-07-01 LAB — BRAIN NATRIURETIC PEPTIDE: B NATRIURETIC PEPTIDE 5: 316.2 pg/mL — AB (ref 0.0–100.0)

## 2016-07-01 LAB — MRSA PCR SCREENING: MRSA BY PCR: POSITIVE — AB

## 2016-07-01 LAB — TSH: TSH: 0.77 u[IU]/mL (ref 0.350–4.500)

## 2016-07-01 LAB — MAGNESIUM: MAGNESIUM: 2 mg/dL (ref 1.7–2.4)

## 2016-07-01 LAB — ECHOCARDIOGRAM COMPLETE
Height: 67 in
Weight: 3064 oz

## 2016-07-01 LAB — T4, FREE: Free T4: 1.12 ng/dL (ref 0.61–1.12)

## 2016-07-01 LAB — TROPONIN I: TROPONIN I: 0.24 ng/mL — AB (ref <0.03)

## 2016-07-01 MED ORDER — ORAL CARE MOUTH RINSE
15.0000 mL | Freq: Two times a day (BID) | OROMUCOSAL | Status: DC
  Administered 2016-07-02 – 2016-07-03 (×2): 15 mL via OROMUCOSAL

## 2016-07-01 MED ORDER — SPIRONOLACTONE 25 MG PO TABS
25.0000 mg | ORAL_TABLET | Freq: Every day | ORAL | Status: DC
  Administered 2016-07-01: 25 mg via ORAL
  Filled 2016-07-01: qty 1

## 2016-07-01 MED ORDER — CHLORHEXIDINE GLUCONATE CLOTH 2 % EX PADS
6.0000 | MEDICATED_PAD | Freq: Every day | CUTANEOUS | Status: DC
  Administered 2016-07-01: 6 via TOPICAL

## 2016-07-01 MED ORDER — MUPIROCIN 2 % EX OINT
1.0000 "application " | TOPICAL_OINTMENT | Freq: Two times a day (BID) | CUTANEOUS | Status: DC
  Administered 2016-07-02 – 2016-07-03 (×3): 1 via NASAL
  Filled 2016-07-01: qty 22

## 2016-07-01 MED ORDER — DILTIAZEM HCL-DEXTROSE 100-5 MG/100ML-% IV SOLN (PREMIX)
5.0000 mg/h | INTRAVENOUS | Status: DC
  Filled 2016-07-01: qty 100

## 2016-07-01 MED ORDER — TORSEMIDE 20 MG PO TABS
40.0000 mg | ORAL_TABLET | Freq: Two times a day (BID) | ORAL | Status: DC
  Administered 2016-07-01 – 2016-07-03 (×4): 40 mg via ORAL
  Filled 2016-07-01 (×5): qty 2

## 2016-07-01 MED ORDER — RESOURCE THICKENUP CLEAR PO POWD
ORAL | Status: DC | PRN
  Filled 2016-07-01 (×2): qty 125

## 2016-07-01 NOTE — Progress Notes (Signed)
At approx. 0318 RN noticed pt HR bradied to 38. Upon rushing to room, pt was awoken easily with a light touch and A&O x4, HR back up to 60s, VSS. EKG shows sinus bradycardia to sinus rhythm with PACs. NP Schorr text paged and made aware. Pt is resting comfortably in bed, no complaints at this time, HR fluctuating between 50s-70s. Will continue to monitor closely.   EKG machine unable to send results to EPIC at this time, hard copies are in pt's ghost chart.

## 2016-07-01 NOTE — Progress Notes (Signed)
[facility] PROGRESS NOTE  Eduardo Salazar LNL:[id number] DOB: July 09, 1925 DOA: 06/28/2016   PCP: Sande Brothers, MD     Assessment/Plan: Principal Problem:   Acute diastolic CHF (congestive heart failure) (HCC) Active Problems:   CKD (chronic kidney disease) stage 3, GFR 30-59 ml/min   BPH (benign prostatic hyperplasia)   Coronary artery disease involving native heart without angina pectoris   Right ureteral calculus   Acute congestive heart failure ([place])    83 y.o. male with medical history significant of congestive heart failure, BPH, recent hospitalization from 4/13-4/15-subsequently was discharged to his assisted living facility. He was brought back to the emergency room for evaluation of worsening shortness of breath.he developed worsening shortness of breath, he was brought to the emergency room, where he was thought to have congestive heart failure and I was subsequently asked to admit this patient for further evaluation and treatment. Patient was recently admitted and discharged on 4/15-during this admission CT of abdomen and pelvis showed GB sludge, focal thickening of the rectosigmoid colon concerning for possible neoplasm as well as a nonobstructive right distal ureteral calculus. GI and urology were consulted to assist with management. .s/p right ureteroscopic laser lithotripsy and stone removal and ureteral stent placement  4/14.  Assessment and plan   Acute hypoxic respiratory failure   suspect that this is secondary to acute/decompensated CHF. Patient is off BiPAP. Now on 3 L nasal cannula, continue intravenous diuretics. 2-D echo pending. Lasix IV 40 mg every 12. Repeat chest x-ray today, may need CT chest wo contrast to differentiate edema versus pneumonia   Probable acute diastolic heart failure: No prior echocardiogram in the system-he is significantly volume overloaded on exam. Continue Lasix, hold Coreg for now as blood pressure soft. Check echo-he follows  with Dr. Einar Gip,  he has been consulted  Leukocytosis: No evidence of any infection at this time-chest x-ray does not show any overt pneumonia, UA without UTI. Received empiric vancomycin and meropenem , would hold further antibiotics, and an attempt to diurese this patient. Repeat chest x-ray tomorrow morning, follow blood cultures. If he becomes febrile, or worsens, we will resume antimicrobial therapy. Patient is status post Right ureteral stent placement , Dr. Alinda Money. Recently discharged home on Augmentin 4 days after finishing a course of levofloxacin  ? Atrial fibrillation: EKG with PVCs, will request cardiology to evaluate. 2-D echo, check TSH. Noted to have resting bradycardia will discontinue Coreg and consult cardiology  Hypertension: Appears controlled-holding carvedilol-continue Lasix.  History of right ureteral calculus-underwent ureterocystoscopy and stone removal on 4/14.  BPH: Continue Flomax/finasteride-     DVT prophylaxsis Lovenox  Code Status:  Full code    Family Communication: Discussed in detail with the patient/daughter, all imaging results, lab results explained to the patient   Disposition Plan:  Cardiology consult      Consultants:  Cardiology  Procedures:  None  Antibiotics: Anti-infectives    Start     Dose/Rate Route Frequency Ordered Stop   07/01/16 1600  ceFEPIme (MAXIPIME) 2 g in dextrose 5 % 50 mL IVPB  Status:  Discontinued     2 g 100 mL/hr over 30 Minutes Intravenous Every 24 hours 07/01/2016 1631 06/14/2016 1846   07/01/16 0400  vancomycin (VANCOCIN) IVPB 750 mg/150 ml premix  Status:  Discontinued     750 mg 150 mL/hr over 60 Minutes Intravenous Every 12 hours 06/23/2016 1631 07/03/2016 1846   07/01/2016 1615  ceFEPIme (MAXIPIME) 2 g in dextrose 5 % 50  mL IVPB     2 g 100 mL/hr over 30 Minutes Intravenous  Once 06/23/2016 1610 06/14/2016 1748   06/17/2016 1615  vancomycin (VANCOCIN) IVPB 1000 mg/200 mL premix     1,000 mg 200 mL/hr over 60  Minutes Intravenous  Once 07/06/2016 1610 07/04/2016 1848         HPI/Subjective: Patient appears to be very weak and fragile, weak -cough, currently on high flow nasal cannula  Objective: Vitals:   07/01/16 0729 07/01/16 0800 07/01/16 0900 07/01/16 0925  BP: (!) 96/50 (!) 107/55 94/81   Pulse: (!) 56 89 81 73  Resp: 12  16 15   Temp: 97.4 F (36.3 C)     TempSrc: Oral     SpO2: 99% 98% 94% 96%  Weight:      Height:        Intake/Output Summary (Last 24 hours) at 07/01/16 5681 Last data filed at 07/01/16 0700  Gross per 24 hour  Intake              490 ml  Output             1100 ml  Net             -610 ml    Exam:  Examination:  General exam: Appears calm and comfortable  Respiratory system: Clear to auscultation. Respiratory effort poor Cardiovascular system: S1 & S2 heard, RRR. No JVD, murmurs, rubs, gallops or clicks. No pedal edema. Gastrointestinal system: Abdomen is nondistended, soft and nontender. No organomegaly or masses felt. Normal bowel sounds heard. Central nervous system: Alert and oriented. No focal neurological deficits. Extremities: Symmetric 5 x 5 power. Skin: No rashes, lesions or ulcers Psychiatry: Judgement impaired.     Data Reviewed: I have personally reviewed following labs and imaging studies  Micro Results Recent Results (from the past 240 hour(s))  Urine culture     Status: Abnormal   Collection Time: 06/25/16  1:04 PM  Result Value Ref Range Status   Specimen Description URINE, RANDOM  Final   Special Requests NONE  Final   Culture MULTIPLE SPECIES PRESENT, SUGGEST RECOLLECTION (A)  Final   Report Status 06/26/2016 FINAL  Final  Urine culture     Status: None   Collection Time: 06/28/16 11:06 PM  Result Value Ref Range Status   Specimen Description URINE, CATHETERIZED  Final   Special Requests NONE  Final   Culture   Final    NO GROWTH Performed at [facility], [address] [place], [place] [address]    Report  Status 06/14/2016 FINAL  Final  MRSA PCR Screening     Status: Abnormal   Collection Time: 07/04/2016  6:52 PM  Result Value Ref Range Status   MRSA by PCR POSITIVE (A) NEGATIVE Final    Comment:        The GeneXpert MRSA Assay (FDA approved for NASAL specimens only), is one component of a comprehensive MRSA colonization surveillance program. It is not intended to diagnose MRSA infection nor to guide or monitor treatment for MRSA infections. RESULT CALLED TO, READ BACK BY AND VERIFIED WITH: M.HINES,RN AT 0142 BY L.PITT 07/01/16     Radiology Reports Dg Chest 2 View  Result Date: 06/25/2016 CLINICAL DATA:  Cough, congestion, recent flu symptoms, weakness EXAM: CHEST  2 VIEW COMPARISON:  03/02/2015 FINDINGS: Cardiomediastinal silhouette is stable. No infiltrate or pulmonary edema. Surgical clips upper paraspinal region are again noted. No infiltrate or pulmonary edema. Mild left  basilar atelectasis or scarring. Degenerative changes thoracic spine. IMPRESSION: No active cardiopulmonary disease. Electronically Signed   By: Lahoma Crocker M.D.   On: 06/25/2016 08:10   Ct Abdomen Pelvis W Contrast  Result Date: 06/25/2016 CLINICAL DATA:  Generalized abdominal pain. EXAM: CT ABDOMEN AND PELVIS WITH CONTRAST TECHNIQUE: Multidetector CT imaging of the abdomen and pelvis was performed using the standard protocol following bolus administration of intravenous contrast. CONTRAST:  51mL ISOVUE-300 IOPAMIDOL (ISOVUE-300) INJECTION 61% COMPARISON:  Radiograph same day.  CT scan of March 02, 2015. FINDINGS: Lower chest: Bronchiectasis is noted in the lower lobes bilaterally. No acute pulmonary disease is noted. Hepatobiliary: Liver appears normal. Possible sludge seen in the gallbladder lumen. Pancreas: Unremarkable. No pancreatic ductal dilatation or surrounding inflammatory changes. Spleen: Normal in size without focal abnormality. Adrenals/Urinary Tract: Adrenal glands appear normal. Severe left renal  atrophy is noted. Nonobstructive calculus is noted in the left kidney. Right kidney appears normal. No hydronephrosis or right ureteral dilatation is noted. However, there is a 9 mm nonobstructive calculus seen in the distal right ureter. Urinary bladder is unremarkable. Stomach/Bowel: Diverticulosis of descending and sigmoid colon is noted without inflammation. There is no evidence of bowel obstruction. Status post appendectomy. Focal thickening of rectosigmoid junction is noted concerning for possible neoplasm. Vascular/Lymphatic: Aortic atherosclerosis. No enlarged abdominal or pelvic lymph nodes. Reproductive: Prostatic calcifications are noted. Prostate gland appears to be normal in size. Other: Small fat containing left inguinal hernia is noted. Musculoskeletal: No acute or significant osseous findings. IMPRESSION: Focal thickening of rectosigmoid junction is noted concerning for possible neoplasm. Sigmoidoscopy is recommended for further evaluation. Bronchiectasis is noted in both lower lobes. Probable sludge seen within the gallbladder. Severe left renal atrophy with nonobstructive calculus. No hydronephrosis or ureteral dilatation is noted. However, 9 mm nonobstructive calculus is seen in the distal right ureter. Diverticulosis of descending and sigmoid colon is noted without inflammation. Aortic atherosclerosis. Electronically Signed   By: Marijo Conception, M.D.   On: 06/25/2016 10:35   Dg Chest Port 1 View  Result Date: 07/04/2016 CLINICAL DATA:  Shortness of breath. EXAM: PORTABLE CHEST 1 VIEW COMPARISON:  06/27/2016.  06/25/2016. FINDINGS: Surgical clips are noted over the chest. Cardiomegaly. Bilateral pulmonary interstitial prominence noted. Mild CHF cannot be excluded. Mild pneumonitis cannot be excluded. Findings have progressed slightly from prior exam . Underlying chronic interstitial changes are most likely present. IMPRESSION: Interim slight progression of pulmonary interstitial prominence  consistent with slight progression of congestive heart failure with pulmonary interstitial edema. Interstitial pneumonitis cannot be excluded. Electronically Signed   By: Marcello Moores  Register   On: 07/12/2016 14:50   Dg Chest Port 1 View  Result Date: 06/27/2016 CLINICAL DATA:  Cough today.  Dyspnea. EXAM: PORTABLE CHEST 1 VIEW COMPARISON:  Two-view chest x-ray 06/25/2016 FINDINGS: Heart size is normal. There is slight increase in a diffuse interstitial pattern suggesting mild edema superimposed on chronic change. Mild bibasilar atelectasis is noted. There is no significant airspace consolidation. IMPRESSION: 1. Mild edema superimposed on chronic interstitial change. 2. No significant consolidation. Electronically Signed   By: San Morelle M.D.   On: 06/27/2016 11:45   Dg Abd 2 Views  Result Date: 06/25/2016 CLINICAL DATA:  Acute generalized abdominal pain. EXAM: ABDOMEN - 2 VIEW COMPARISON:  Radiographs of May 26, 2014. FINDINGS: Mildly dilated small bowel loops are noted in the right lower quadrant which may represent ileus or possibly obstruction. No colonic dilatation is noted. Mild amount of stool is noted in the colon.  No free air is noted. No definite calculi are noted. IMPRESSION: Mildly dilated small bowel loops are noted in the right lower quadrant which may represent ileus or possibly small bowel obstruction. Follow-up radiographs are recommended. Electronically Signed   By: Marijo Conception, M.D.   On: 06/25/2016 08:11   Dg C-arm 1-60 Min-no Report  Result Date: 06/26/2016 Fluoroscopy was utilized by the requesting physician.  No radiographic interpretation.     CBC  Recent Labs Lab 06/25/16 0744 06/26/16 0613 06/28/16 2250 06/29/2016 1528 07/01/16 0248  WBC 9.8 7.7 9.4 19.2* 14.4*  HGB 13.4 12.1* 12.4* 12.6* 11.4*  HCT 39.8 37.1* 37.2* 37.5* 35.0*  PLT 205 172 172 187 176  MCV 86.9 85.9 86.5 85.8 86.2  MCH 29.3 28.0 28.8 28.8 28.1  MCHC 33.7 32.6 33.3 33.6 32.6  RDW  15.0 15.1 15.6* 15.9* 15.8*  LYMPHSABS  --   --  2.3 1.7  --   MONOABS  --   --  1.1* 0.6  --   EOSABS  --   --  0.2 0.0  --   BASOSABS  --   --  0.0 0.0  --     Chemistries   Recent Labs Lab 06/25/16 0744 06/26/16 0613 06/27/16 0616 06/28/16 2250 06/14/2016 1422 07/01/16 0248  NA 138 143 137 139 137 136  K 3.9 4.1 4.4 3.9 4.3 3.9  CL 104 115* 110 112* 109 106  CO2 25 21* 19* 21* 21* 24  GLUCOSE 125* 104* 126* 122* 140* 161*  BUN 23* 17 16 15 12 17   CREATININE 1.70* 1.30* 1.23 1.28* 1.17 1.34*  CALCIUM 9.0 8.2* 7.7* 8.3* 8.4* 8.2*  MG  --   --  1.9  --   --   --   AST 21  --   --   --  27  --   ALT 17  --   --   --  21  --   ALKPHOS 100  --   --   --  83  --   BILITOT 0.7  --   --   --  1.0  --    ------------------------------------------------------------------------------------------------------------------ estimated creatinine clearance is 40.1 mL/min (A) (by C-G formula based on SCr of 1.34 mg/dL (H)). ------------------------------------------------------------------------------------------------------------------ No results for input(s): HGBA1C in the last 72 hours. ------------------------------------------------------------------------------------------------------------------ No results for input(s): CHOL, HDL, LDLCALC, TRIG, CHOLHDL, LDLDIRECT in the last 72 hours. ------------------------------------------------------------------------------------------------------------------ No results for input(s): TSH, T4TOTAL, T3FREE, THYROIDAB in the last 72 hours.  Invalid input(s): FREET3 ------------------------------------------------------------------------------------------------------------------ No results for input(s): VITAMINB12, FOLATE, FERRITIN, TIBC, IRON, RETICCTPCT in the last 72 hours.  Coagulation profile  Recent Labs Lab 06/29/2016 1422  INR 1.19    No results for input(s): DDIMER in the last 72 hours.  Cardiac Enzymes  Recent Labs Lab  06/25/16 0744  TROPONINI <0.03   ------------------------------------------------------------------------------------------------------------------ Invalid input(s): POCBNP   CBG: No results for input(s): GLUCAP in the last 168 hours.     Studies: Dg Chest Port 1 View  Result Date: 07/01/2016 CLINICAL DATA:  Shortness of breath. EXAM: PORTABLE CHEST 1 VIEW COMPARISON:  06/27/2016.  06/25/2016. FINDINGS: Surgical clips are noted over the chest. Cardiomegaly. Bilateral pulmonary interstitial prominence noted. Mild CHF cannot be excluded. Mild pneumonitis cannot be excluded. Findings have progressed slightly from prior exam . Underlying chronic interstitial changes are most likely present. IMPRESSION: Interim slight progression of pulmonary interstitial prominence consistent with slight progression of congestive heart failure with pulmonary interstitial edema. Interstitial pneumonitis cannot be  excluded. Electronically Signed   By: Marcello Moores  Register   On: 06/15/2016 14:50      No results found for: HGBA1C Lab Results  Component Value Date   CREATININE 1.34 (H) 07/01/2016       Scheduled Meds: . Chlorhexidine Gluconate Cloth  6 each Topical Q0600  . enoxaparin (LOVENOX) injection  40 mg Subcutaneous Q24H  . finasteride  5 mg Oral Daily  . furosemide  40 mg Intravenous Q12H  . guaiFENesin  1,200 mg Oral BID  . ipratropium-albuterol  3 mL Nebulization TID  . mouth rinse  15 mL Mouth Rinse BID  . mouth rinse  15 mL Mouth Rinse BID  . [START ON 07/02/2016] mupirocin ointment  1 application Nasal BID  . sodium chloride flush  3 mL Intravenous Q12H  . tamsulosin  0.4 mg Oral BID   Continuous Infusions:   LOS: 1 day    Time spent: >30 MINS    Reyne Dumas  [facility] Pager [phone]. If 7PM-7AM, please contact night-coverage at [email or web address], password [facility] 07/01/2016, 9:27 AM  LOS: 1 day

## 2016-07-01 NOTE — Evaluation (Signed)
Physical Therapy Evaluation Patient Details Name: Eduardo Salazar MRN: [id number] DOB: October 10, 1925 Today's Date: 07/01/2016   History of Present Illness  Pt is an 83 y.o. male admitted to ED on 06/13/2016 with worsening SOB and LE edema; determined to have acute hypoxic resp failure suspect secondary to acute/decompensated CHF. Noted to have resting bradycardia and a-fib. CXR shows slight progression of pulmonary interstitial prominence consistent with slight progression of CHF with pulmonary interstitial edema; interstitial pneumonitis cannot be excluded. Recent hospitalization 4/13-15 for low-grade fever along with symptoms of an upper respiratory infection. Pertinent PMH includes BPH, CKD, HTN, skin CA, MI.      Clinical Impression  Pt presents to PT with generalized weakness, decreased activity tolerance, soft BP (see values below), and an overall decrease in functional mobility secondary to above. PTA, pt lives with wife at ALF; mod indep with rollator for amb and requires assist for bathing. Today, pt able to take a couple steps to and from [facility] with RW and minA for balance; totalA for pericare. Pt with increased anxiety reporting fear of falling with mobility; c/o dizziness with standing and amb. Pt would benefit from continued acute PT services to maximize functional mobility and independence.   Supine BP 97/56 Sitting BP 94/64 Post-transfer to BSC BP 95/52 Sitting on BSC BP 78/69 Supine BP 94/67    Follow Up Recommendations SNF;Supervision for mobility/OOB    Equipment Recommendations  None recommended by PT    Recommendations for Other Services OT consult     Precautions / Restrictions Precautions Precautions: Fall Precaution Comments: Watch BP Restrictions Weight Bearing Restrictions: No      Mobility  Bed Mobility Overal bed mobility: Needs Assistance Bed Mobility: Supine to Sit;Sit to Supine     Supine to sit: Mod assist Sit to supine: Max assist   General bed  mobility comments: ModA to sit EOB with assist from trunk support and UE support into sitting. MaxA to supine for BLE support into bed; maxA to scoot up in bed.  Transfers Overall transfer level: Needs assistance Equipment used: Rolling walker (2 wheeled) Transfers: Sit to/from Stand Sit to Stand: Min assist         General transfer comment: Stood with RW and minA x2 from bed and BSC; c/o dizziness with standing. Cues for hand placement on RW. TotalA for pericare.  Ambulation/Gait Ambulation/Gait assistance: Min assist Ambulation Distance (Feet):  (steps to [facility]) Assistive device: Rolling walker (2 wheeled) Gait Pattern/deviations: Step-to pattern;Trunk flexed Gait velocity: Decreased   General Gait Details: Took steps to/from [facility] with RW and minA for balance; increased anxiety and c/o dizziness with amb.   Stairs            Wheelchair Mobility    Modified Rankin (Stroke Patients Only)       Balance Overall balance assessment: Needs assistance Sitting-balance support: No upper extremity supported;Feet supported Sitting balance-Leahy Scale: Fair Sitting balance - Comments: MinA progressing to min guard for sitting balance.   Standing balance support: Bilateral upper extremity supported;During functional activity Standing balance-Leahy Scale: Poor Standing balance comment: Reliant on BUE support for standing; pt reports fear of falling.                             Pertinent Vitals/Pain Pain Assessment: Faces Faces Pain Scale: Hurts even more Pain Location: L abdomen Pain Descriptors / Indicators: Discomfort;Sharp Pain Intervention(s): Limited activity within patient's tolerance;Monitored during session;Repositioned    Home Living  Family/patient expects to be discharged to:: Assisted living               Home Equipment: Gilford Rile - 4 wheels Additional Comments: Lives in ALF with spouse who is unable to provide assistance.     Prior Function Level  of Independence: Needs assistance   Gait / Transfers Assistance Needed: Amb mod indep with rollator in apartment and to meals.   ADL's / Homemaking Assistance Needed: Meals provided by facility, assistance provided with bathing. Pt reports indep with pericare.        Hand Dominance        Extremity/Trunk Assessment   Upper Extremity Assessment Upper Extremity Assessment: Generalized weakness    Lower Extremity Assessment Lower Extremity Assessment: Generalized weakness;RLE deficits/detail;LLE deficits/detail RLE Deficits / Details: BLE pitting edema below knee 3+ LLE Deficits / Details: BLE pitting edema below knee 3+    Cervical / Trunk Assessment Cervical / Trunk Assessment: Kyphotic  Communication   Communication: HOH  Cognition Arousal/Alertness: Awake/alert Behavior During Therapy: WFL for tasks assessed/performed Overall Cognitive Status: No family/caregiver present to determine baseline cognitive functioning Area of Impairment: Orientation;Memory;Awareness                 Orientation Level: Disoriented to;Situation   Memory: Decreased short-term memory     Awareness: Emergent   General Comments: Per previous PT note (4 days ago), history of cognitive impairments at baseline. Pt A&Ox3 and conversing appropriately, repeating some facts multiple times that he had already shared. Increased anxiety with mobility, reports fear of falling.       General Comments General comments (skin integrity, edema, etc.): SpO2 down to 90% on 2L O2 [place], remained >96% on 3L O2 .     Exercises     Assessment/Plan    PT Assessment Patient needs continued PT services  PT Problem List Decreased strength;Decreased activity tolerance;Decreased balance;Decreased mobility;Decreased cognition;Cardiopulmonary status limiting activity;Decreased skin integrity;Pain       PT Treatment Interventions DME instruction;Gait training;Functional mobility training;Therapeutic  activities;Therapeutic exercise;Balance training;Patient/family education;Neuromuscular re-education    PT Goals (Current goals can be found in the Care Plan section)  Acute Rehab PT Goals Patient Stated Goal: go home PT Goal Formulation: With patient Time For Goal Achievement: 07/15/16 Potential to Achieve Goals: Good    Frequency Min 2X/week   Barriers to discharge        Co-evaluation               End of Session Equipment Utilized During Treatment: Gait belt;Oxygen Activity Tolerance: Patient limited by fatigue;Other (comment) (Anxiety) Patient left: in bed;with call bell/phone within reach Nurse Communication: Mobility status PT Visit Diagnosis: Muscle weakness (generalized) (M62.81);Unsteadiness on feet (R26.81)    Time: 7262-0355 PT Time Calculation (min) (ACUTE ONLY): 31 min   Charges:   PT Evaluation $PT Eval Moderate Complexity: 1 Procedure PT Treatments $Therapeutic Activity: 8-22 mins   PT G Codes:       Enis Gash, SPT Office-[phone]  Mabeline Caras 07/01/2016, 3:22 PM

## 2016-07-01 NOTE — Progress Notes (Signed)
Einar Gip,  MD called for worsening shortness of breath. Pt states, " I feel horrible. I just feel horrible. Its hard to breathe." PT back in NSR with pvc's HR 80 at this time. BP 100/44. 3 Liters [place]. 98%. MD verbal order to go ahead and give pt's lasix. Will continue to monitor closely.   Andry Bogden N. Emogene Morgan, RN

## 2016-07-01 NOTE — Consult Note (Addendum)
CARDIOLOGY CONSULT NOTE  Patient ID: Eduardo Salazar MRN: [id number] DOB/AGE: June 22, 1925 83 y.o.  Admit date: 07/08/2016 Referring Physician  Eduardo Dumas, MD Primary Physician:  Eduardo Brothers, MD Reason for Consultation  CHF  HPI: Eduardo Salazar  is a 83 y.o. male  With Chronic diastolic heart failure, hypertension,  non-ST elevation myocardial infarction in 2014, history of peptic ulcer disease admitted with worsening shortness of breath and dyspnea on exertion yesterday. I was consulted to see the patient due to atrial fibrillation and CHF. Patient has no known history of PAF in the past. Patient states that over the past couple weeks he is truly started to get weak, shortness of breath and leg edema. No chest pain. Due to inability to do even minimal activities of daily living, he was brought to the emergency room by his daughter. At this point, patient states that his breathing has slightly improved. He still is unable to lay down flat. Otherwise no other specific complaints except for fatigue.  Past Medical History:  Diagnosis Date  . BPH (benign prostatic hyperplasia)   . CKD (chronic kidney disease) stage 3, GFR 30-59 ml/min   . Hx of radiation therapy 01/15/13- 02/13/13   left temple 5000 cGy 20 sessions  . Hypertension   . Myocardial infarction [facility]) 2014   mild"no symptoms" was told 2 months ago  . Pyloric ulcer   . skin ca    top of head; xrt comp     Past Surgical History:  Procedure Laterality Date  . APPENDECTOMY    . CATARACT EXTRACTION, BILATERAL    . COLONOSCOPY WITH PROPOFOL N/A 06/26/2016   Procedure: COLONOSCOPY WITH PROPOFOL;  Surgeon: Eduardo Craver, MD;  Location: [facility];  Service: Endoscopy;  Laterality: N/A;  . CYSTOSCOPY W/ URETERAL STENT PLACEMENT  02/2002   on left for ureteral stone  . CYSTOSCOPY WITH RETROGRADE PYELOGRAM, URETEROSCOPY AND STENT PLACEMENT Left 07/31/2012   Procedure: CYSTOSCOPY WITH LEFT RETROGRADE PYELOGRAM, LEFT URETEROSCOPY;  Surgeon:  Eduardo Jabs, MD;  Location: [facility];  Service: Urology;  Laterality: Left;  . CYSTOSCOPY/RETROGRADE/URETEROSCOPY/STONE EXTRACTION WITH BASKET Right 06/26/2016   Procedure: CYSTOSCOPY/RIGHT RETROGRADE/RIGHT URETEROSCOPY/LASER LITHOTRIPSY WITH STONE EXTRACTION WITH BASKET/RIGHT URETERAL STENT PLACEMENT;  Surgeon: Eduardo Bring, MD;  Location: [facility];  Service: Urology;  Laterality: Right;  . ESOPHAGOGASTRODUODENOSCOPY N/A 09/14/2012   Procedure: ESOPHAGOGASTRODUODENOSCOPY (EGD);  Surgeon: Eduardo Bears, MD;  Location: [place];  Service: Gastroenterology;  Laterality: N/A;  . ESOPHAGOGASTRODUODENOSCOPY N/A 11/14/2012   Procedure: ESOPHAGOGASTRODUODENOSCOPY (EGD);  Surgeon: Eduardo Bears, MD;  Location: [facility];  Service: Gastroenterology;  Laterality: N/A;  . HOLMIUM LASER APPLICATION Right 04/26/9415   Procedure: HOLMIUM LASER APPLICATION;  Surgeon: Eduardo Bring, MD;  [facility];  Service: Urology;  Laterality: Right;  . MOHS SURGERY Left 11/28/12   forehead  . SKIN BIOPSY Left 10/27/12   left forehead near frontal scalp  . VASECTOMY    . WRIST SURGERY     "nerve repair"     Family History  Problem Relation Age of Onset  . Heart attack Father   . Arthritis Mother   . Colon cancer Neg Hx      Social History: Social History   Social History  . Marital status: Married    Spouse name: N/A  . Number of children: 7  . Years of education: N/A   Occupational History  . retired    Social History Main Topics  . Smoking status: Former Smoker    Packs/day: 0.50  Years: 22.00    Types: Cigarettes    Quit date: 07/27/1973  . Smokeless tobacco: Never Used  . Alcohol use No  . Drug use: No  . Sexual activity: Not Currently   Other Topics Concern  . Not on file   Social History Narrative   Assisted living.  [facility], walks with a cane.       Prescriptions Prior to Admission  Medication Sig Dispense Refill Last Dose  . acetaminophen (TYLENOL) 500 MG tablet Take  1,000 mg by mouth every 8 (eight) hours as needed (for mild pain, fever, or headache).    PRN at PRN  . alum & mag hydroxide-simeth (MINTOX) 401-027-25 MG/5ML suspension Take 30 mLs by mouth See admin instructions. AS NEEDED FOR HEARTBURN OR INDIGESTION/NOT TO EXCEED 4 DOSES IN 24 HOURS   PRN at PRN  . amoxicillin-clavulanate (AUGMENTIN) 875-125 MG tablet Take 1 tablet by mouth every 12 (twelve) hours. (Patient taking differently: Take 1 tablet by mouth every 12 (twelve) hours. For 4 days) 8 tablet 0 06/27/2016 at 0800  . carvedilol (COREG) 3.125 MG tablet Take 3.125 mg by mouth daily.    07/04/2016 at 0800  . cephALEXin (KEFLEX) 500 MG capsule Take 1 capsule (500 mg total) by mouth 2 (two) times daily. 12 capsule 0 07/04/2016 at 0800  . docusate sodium (COLACE) 100 MG capsule Take 100 mg by mouth daily.   07/06/2016 at 0800  . famotidine (PEPCID) 20 MG tablet Take 20 mg by mouth at bedtime.   06/29/2016 at 2000  . finasteride (PROSCAR) 5 MG tablet Take 5 mg by mouth daily.    06/22/2016 at 0800  . guaifenesin (ROBAFEN) 100 MG/5ML syrup Take 200 mg by mouth every 6 (six) hours as needed for cough. NOT TO EXCEED 4 DOSES IN 24 HOURS   PRN at PRN  . ipratropium-albuterol (DUONEB) 0.5-2.5 (3) MG/3ML SOLN Take 3 mLs by nebulization 2 (two) times daily. FOR 10 DAYS   07/11/2016 at 0800  . loperamide (IMODIUM) 2 MG capsule Take 2 mg by mouth See admin instructions. WITH EACH LOOSE STOOL AS NEEDED FOR DIARRHEA/NOT TO EXCEED 8 DOES IN 24 HOURS   PRN at PRN  . magnesium hydroxide (MILK OF MAGNESIA) 400 MG/5ML suspension Take 30 mLs by mouth at bedtime as needed (for constipation).    PRN at PRN  . Neomycin-Bacitracin-Polymyxin (TRIPLE ANTIBIOTIC) [phone] OINT See admin instructions. FOR SKIN TEARS OR ABRASIONS: CLEAN AREA WITH NORMAL SALINE, APPLY OINTMENT, COVER WITH BANDAID OR GAUZE AND TAPE. CHANGE AS NEEDED UNTIL HEALED.   PRN at PRN  . nitroGLYCERIN (NITROSTAT) 0.4 MG SL tablet Place 0.4 mg under the tongue  every 5 (five) minutes as needed for chest pain. CALL DOCTOR IS NO RELIEF   PRN at PRN  . polyvinyl alcohol (ARTIFICIAL TEARS) 1.4 % ophthalmic solution Place 1 drop into both eyes 4 (four) times daily.   07/08/2016 at 1200  . tamsulosin (FLOMAX) 0.4 MG CAPS Take 0.4 mg by mouth 2 (two) times daily.    07/09/2016 at 0800  . Vitamin D, Ergocalciferol, (DRISDOL) 50000 UNITS CAPS capsule Take 50,000 Units by mouth every 7 (seven) days.    Not noted at ?     ROS: Marked generalized weakness present. Shortness of breath present. Leg edema present. Denies chest pain. No bloody stools or urinary obstruction or urinary bleeding, patient has history of stones in the past. Does have BPH. No recent weight changes.    Physical Exam: Blood pressure Marland Kitchen)  105/53, pulse 90, temperature 98.2 F (36.8 C), temperature source Oral, resp. rate 17, height 5\' 7"  (1.702 m), weight 86.9 kg (191 lb 8 oz), SpO2 96 %.   General appearance: alert, cooperative and moderate distress Neck: no carotid bruit, supple, symmetrical, trachea midline, thyroid not enlarged, symmetric, no tenderness/mass/nodules and JVD above the angle of the jaw. Lungs: Bilateral diffuse extensive crackles heard throughout anterior and posterior chest fields. Heart: S1, S2 normal, no S3 or S4 and systolic murmur: early systolic 2/6, blowing at lower left sternal border, at apex Abdomen: Abdomen is mildly distended, ascites present, bowel sounds heard in all 4 quadrants. Mild hepatomegaly present. Extremities: 3+ leg edema. Full range of movements. Pulses: Carotids normal, femorals normal, popliteals could not be felt, pedal pulses could not be felt due to edema. Neurologic: Grossly normal  Labs:   Lab Results  Component Value Date   WBC 14.4 (H) 07/01/2016   HGB 11.4 (L) 07/01/2016   HCT 35.0 (L) 07/01/2016   MCV 86.2 07/01/2016   PLT 176 07/01/2016    Recent Labs Lab 06/17/2016 1422 07/01/16 0248  NA 137 136  K 4.3 3.9  CL 109 106  CO2  21* 24  BUN 12 17  CREATININE 1.17 1.34*  CALCIUM 8.4* 8.2*  PROT 5.9*  --   BILITOT 1.0  --   ALKPHOS 83  --   ALT 21  --   AST 27  --   GLUCOSE 140* 161*    Lipid Panel  No results found for: CHOL, TRIG, HDL, CHOLHDL, VLDL, LDLCALC  BNP (last 3 results)  Recent Labs  07/04/2016 1526  BNP 399.6*    HEMOGLOBIN A1C No results found for: HGBA1C, MPG  Cardiac Panel (last 3 results)  Recent Labs  06/25/16 0744  TROPONINI <0.03    Lab Results  Component Value Date   TROPONINI <0.03 06/25/2016     TSH  Recent Labs  07/01/16 1056  TSH 0.770      Radiology: Dg Chest Port 1 View  Result Date: 07/01/2016 CLINICAL DATA:  Shortness of breath. EXAM: PORTABLE CHEST 1 VIEW COMPARISON:  06/29/2016 FINDINGS: The heart is mildly enlarged but stable. The mediastinal and hilar contours are within normal limits and unchanged. Mild tortuosity of the thoracic aorta. Chronic bronchitic type interstitial lung changes but no acute overlying pulmonary findings. Stable surgical changes. The bony thorax is intact. IMPRESSION: No acute cardiopulmonary findings. Stable mild cardiac enlargement and chronic lung changes. Electronically Signed   By: Marijo Sanes M.D.   On: 07/01/2016 12:02   Dg Chest Port 1 View  Result Date: 06/25/2016 CLINICAL DATA:  Shortness of breath. EXAM: PORTABLE CHEST 1 VIEW COMPARISON:  06/27/2016.  06/25/2016. FINDINGS: Surgical clips are noted over the chest. Cardiomegaly. Bilateral pulmonary interstitial prominence noted. Mild CHF cannot be excluded. Mild pneumonitis cannot be excluded. Findings have progressed slightly from prior exam . Underlying chronic interstitial changes are most likely present. IMPRESSION: Interim slight progression of pulmonary interstitial prominence consistent with slight progression of congestive heart failure with pulmonary interstitial edema. Interstitial pneumonitis cannot be excluded. Electronically Signed   By: Marcello Moores  Register   On:  06/15/2016 14:50    Scheduled Meds: . Chlorhexidine Gluconate Cloth  6 each Topical Q0600  . enoxaparin (LOVENOX) injection  40 mg Subcutaneous Q24H  . finasteride  5 mg Oral Daily  . guaiFENesin  1,200 mg Oral BID  . ipratropium-albuterol  3 mL Nebulization TID  . mouth rinse  15 mL Mouth Rinse  BID  . [START ON 07/02/2016] mupirocin ointment  1 application Nasal BID  . sodium chloride flush  3 mL Intravenous Q12H  . spironolactone  25 mg Oral Daily  . tamsulosin  0.4 mg Oral BID  . torsemide  40 mg Oral BID   Continuous Infusions: . diltiazem (CARDIZEM) infusion Stopped (07/01/16 1000)   PRN Meds:.acetaminophen **OR** acetaminophen, albuterol, ondansetron **OR** ondansetron (ZOFRAN) IV, oxyCODONE, RESOURCE THICKENUP CLEAR   CARDIAC STUDIES:  EKG 07/01/2016: Sinus rhythm with sinus arrhythmia, normal axis, nonspecific T-wave abnormality, cannot exclude anterolateral ischemia. Prolonged QT. Compared to 06/29/2016, atrial fibrillation is no longer present. ST abnormalities.  Echocardiogram: Low normal LVEF at 23-53%, grade 2 diastolic dysfunction, moderate to severe posteriorly directed MR. Trace tricuspid regurgitation. Unable to estimate PA pressure.   ASSESSMENT AND PLAN:  1. Acute on chronic diastolic heart failure, suspect non-ST elevation MI, mitral regurgitation may be contributing. 2. Paroxysmal atrial fibrillation 3. Chronic stage III kidney disease 4. Hypertension, presently hypotensive 5. Superficial decubitus ulcer in the lower back without infection or discharge.  Recommendation: I had a long discussion with the patient's daughter Izora Gala over the phone, regarding patient's critical condition and acute florid pulmonary edema and episodes of nonsustained VT and soft blood pressure. Patient is critically ill, she agrees to make him DO NOT RESUSCITATE which I proposed. I also discussed with her in case patient had cardiac arrest, his survival or his quality of life would be be  very poor, potential complications from CPR, attendant long-term complications, infections etc.  I will give 1 dose of Aldactone today although he has stage III chronic kidney disease, to improve diuresis and to improve his pulmonary edema and also changed from furosemide to [facility] as he has not had any significant diuresis with IV Lasix. I'll also place a Foley catheter as he is incontinent, has a small superficial decubitus ulcer, and expect it close monitoring of his diuresis. I suspect he probably has had non-ST elevation myocardial infarctions as there is new EKG abnormalities suggestive of anterolateral ischemia. Overall very guarded prognosis. I the LAD serum troponin to the previously drawn labs to document non-ST elevation MI. I know the patient and his family very well and I'm comfortable with decisions made.  Addendum: His daughter Izora Gala came in to see the patient, I again had a long discussion with her regarding his critical illness. I also advised her that if he indeed were to develop acute pulmonary edema and goes into respiratory distress, we may have to consider withdrawal of support if he does not respond to usual care.  Adrian Prows, MD 07/01/2016, 6:51 PM [facility]. [place] Pager: [phone] Office: [phone] If no answer Cell [phone]

## 2016-07-01 NOTE — Evaluation (Signed)
Clinical/Bedside Swallow Evaluation Patient Details  Name: CASHEL BELLINA MRN: [id number] Date of Birth: 04-09-1925  Today's Date: 07/01/2016 Time: SLP Start Time (ACUTE ONLY): 1120 SLP Stop Time (ACUTE ONLY): 1140 SLP Time Calculation (min) (ACUTE ONLY): 20 min  Past Medical History:  Past Medical History:  Diagnosis Date  . BPH (benign prostatic hyperplasia)   . CKD (chronic kidney disease) stage 3, GFR 30-59 ml/min   . Hx of radiation therapy 01/15/13- 02/13/13   left temple 5000 cGy 20 sessions  . Hypertension   . Myocardial infarction [facility]) 2014   mild"no symptoms" was told 2 months ago  . Pyloric ulcer   . skin ca    top of head; xrt comp   Past Surgical History:  Past Surgical History:  Procedure Laterality Date  . APPENDECTOMY    . CATARACT EXTRACTION, BILATERAL    . COLONOSCOPY WITH PROPOFOL N/A 06/26/2016   Procedure: COLONOSCOPY WITH PROPOFOL;  Surgeon: Juanita Craver, MD;  [facility];  Service: Endoscopy;  Laterality: N/A;  . CYSTOSCOPY W/ URETERAL STENT PLACEMENT  02/2002   on left for ureteral stone  . CYSTOSCOPY WITH RETROGRADE PYELOGRAM, URETEROSCOPY AND STENT PLACEMENT Left 07/31/2012   Procedure: CYSTOSCOPY WITH LEFT RETROGRADE PYELOGRAM, LEFT URETEROSCOPY;  Surgeon: Claybon Jabs, MD;  [facility];  Service: Urology;  Laterality: Left;  . CYSTOSCOPY/RETROGRADE/URETEROSCOPY/STONE EXTRACTION WITH BASKET Right 06/26/2016   Procedure: CYSTOSCOPY/RIGHT RETROGRADE/RIGHT URETEROSCOPY/LASER LITHOTRIPSY WITH STONE EXTRACTION WITH BASKET/RIGHT URETERAL STENT PLACEMENT;  Surgeon: Raynelle Bring, MD;  [facility];  Service: Urology;  Laterality: Right;  . ESOPHAGOGASTRODUODENOSCOPY N/A 09/14/2012   Procedure: ESOPHAGOGASTRODUODENOSCOPY (EGD);  Surgeon: Jerene Bears, MD;  Location: [place];  Service: Gastroenterology;  Laterality: N/A;  . ESOPHAGOGASTRODUODENOSCOPY N/A 11/14/2012   Procedure: ESOPHAGOGASTRODUODENOSCOPY (EGD);  Surgeon: Jerene Bears, MD;   Location: [facility];  Service: Gastroenterology;  Laterality: N/A;  . HOLMIUM LASER APPLICATION Right 4/58/0998   Procedure: HOLMIUM LASER APPLICATION;  Surgeon: Raynelle Bring, MD;  [facility];  Service: Urology;  Laterality: Right;  . MOHS SURGERY Left 11/28/12   forehead  . SKIN BIOPSY Left 10/27/12   left forehead near frontal scalp  . VASECTOMY    . WRIST SURGERY     "nerve repair"   HPI:  KHAREE LESESNE a 83 y.o.malewith medical history significant of congestive heart failure, BPH, recent hospitalization from 4/13-4/15-subsequently was discharged to his assisted living facility. He was brought back to the emergency room for evaluation of worsening shortness of breath.  Pt has been observed to cough with thin liquids and pills. Denies difficulty swallowing PTA.    Assessment / Plan / Recommendation Clinical Impression  Pt demonstrates immediate signs of aspiration with thin liquids as reproted by pt and staff. Subjectively,  timing of swallow initiation appears impaired. Pt tolerates nectar thick liquids and puree with no immediate signs of aspiration. Pt did have some delayed coughing after soldis were given. Recommend pt consume nectar thick liquids today with dys 3 (mecho soft) solids. MBS for objective assessment of swallowing tomorrow. Pt and family in agreement.  SLP Visit Diagnosis: Dysphagia, oropharyngeal phase (R13.12)    Aspiration Risk  Moderate aspiration risk    Diet Recommendation Dysphagia 3 (Mech soft);Nectar-thick liquid   Liquid Administration via: Cup Medication Administration: Whole meds with puree Supervision: Staff to assist with self feeding Compensations: Slow rate;Small sips/bites Postural Changes: Seated upright at 90 degrees    Other  Recommendations Oral Care Recommendations: Oral care BID Other Recommendations: Order  thickener from pharmacy   Follow up Recommendations  (TBD)      Frequency and Duration            Prognosis         Swallow Study   General HPI: CLOYS VERA a 83 y.o.malewith medical history significant of congestive heart failure, BPH, recent hospitalization from 4/13-4/15-subsequently was discharged to his assisted living facility. He was brought back to the emergency room for evaluation of worsening shortness of breath.  Pt has been observed to cough with thin liquids and pills. Denies difficulty swallowing PTA.  Type of Study: Bedside Swallow Evaluation Previous Swallow Assessment: none Diet Prior to this Study: Regular;Thin liquids Temperature Spikes Noted: No Respiratory Status: Nasal cannula History of Recent Intubation: No Behavior/Cognition: Alert;Cooperative;Pleasant mood Oral Cavity Assessment: Within Functional Limits Oral Care Completed by SLP: No Oral Cavity - Dentition: Dentures, top;Dentures, bottom Vision: Functional for self-feeding Self-Feeding Abilities: Able to feed self Patient Positioning: Upright in bed Baseline Vocal Quality: Breathy Volitional Cough: Strong Volitional Swallow: Able to elicit    Oral/Motor/Sensory Function Overall Oral Motor/Sensory Function: Within functional limits   Ice Chips     Thin Liquid Thin Liquid: Impaired Presentation: Cup;Self Fed Pharyngeal  Phase Impairments: Suspected delayed Swallow;Multiple swallows;Cough - Immediate    Nectar Thick Nectar Thick Liquid: Within functional limits Presentation: Cup;Self Fed   Honey Thick Honey Thick Liquid: Not tested   Puree Puree: Within functional limits Presentation: Self Fed;Spoon   Solid   GO   Solid: Within functional limits Presentation: Self Fed        [place]ah Cipriani, Katherene Ponto 07/01/2016,12:35 PM

## 2016-07-01 NOTE — Consult Note (Signed)
[facility] Nurse wound consult note Reason for Consult: Consult requested for bilat buttocks. Family member at the bedside to look at the areas and discuss plan of care.  Pt is frequently incontinent and it is difficult to keep the wounds from becoming soiled related to the location. Wound type: Red irregular-shaped partial thickness skin loss to left and right upper buttocks.  1.5X1X.1cm and 2.5X2.5X.1cm Pressure Injury POA: These were present on admission but appearance is consistent with partial thickness skin loss related to shear, NOT pressure Wound bed: pink and dry Drainage (amount, consistency, odor) no odor or drainage Dressing procedure/placement/frequency: Foam dressing to protect and promote healing. Please re-consult if further assistance is needed.  Thank-you,  Julien Girt MSN, [place], [place], [place], [place]

## 2016-07-01 NOTE — Progress Notes (Signed)
Pt with 18 beats VT. Einar Gip, MD paged for additional orders. Will continue to monitor closely.

## 2016-07-01 NOTE — Progress Notes (Signed)
Pt choked on water while taking PO meds. MD paged for chest xray. Pt made NPO until speech therapy sees patient. MD verbal order to expedite bedside echo and chest xray. Cards has been consulted for afib. Will continue to monitor closely.   Manus Gunning, RN

## 2016-07-01 NOTE — Progress Notes (Signed)
Pt back in NSR at this time. HR 68 BP 108/62.

## 2016-07-01 NOTE — Progress Notes (Signed)
Pt BP 94/81. Pt HR 84-105. Ekg obtained for irregular rhythm. Abrol MD notified. Will continue to monitor closely.  Manus Gunning, RN

## 2016-07-01 NOTE — Progress Notes (Signed)
*   Echocardiogram has been performed.  Eduardo Salazar 07/01/2016, 3:41 PM

## 2016-07-02 ENCOUNTER — Inpatient Hospital Stay (HOSPITAL_COMMUNITY): Payer: Medicare Other

## 2016-07-02 DIAGNOSIS — J9621 Acute and chronic respiratory failure with hypoxia: Secondary | ICD-10-CM

## 2016-07-02 LAB — COMPREHENSIVE METABOLIC PANEL
ALT: 23 U/L (ref 17–63)
AST: 27 U/L (ref 15–41)
Albumin: 2.4 g/dL — ABNORMAL LOW (ref 3.5–5.0)
Alkaline Phosphatase: 90 U/L (ref 38–126)
Anion gap: 11 (ref 5–15)
BILIRUBIN TOTAL: 0.5 mg/dL (ref 0.3–1.2)
BUN: 31 mg/dL — AB (ref 6–20)
CALCIUM: 8.6 mg/dL — AB (ref 8.9–10.3)
CHLORIDE: 102 mmol/L (ref 101–111)
CO2: 26 mmol/L (ref 22–32)
CREATININE: 1.57 mg/dL — AB (ref 0.61–1.24)
GFR, EST AFRICAN AMERICAN: 44 mL/min — AB (ref >60)
GFR, EST NON AFRICAN AMERICAN: 38 mL/min — AB (ref >60)
Glucose, Bld: 126 mg/dL — ABNORMAL HIGH (ref 65–99)
Potassium: 3.5 mmol/L (ref 3.5–5.1)
Sodium: 139 mmol/L (ref 135–145)
TOTAL PROTEIN: 6.2 g/dL — AB (ref 6.5–8.1)

## 2016-07-02 LAB — CBC
HCT: 33.9 % — ABNORMAL LOW (ref 39.0–52.0)
Hemoglobin: 11.1 g/dL — ABNORMAL LOW (ref 13.0–17.0)
MCH: 28.3 pg (ref 26.0–34.0)
MCHC: 32.7 g/dL (ref 30.0–36.0)
MCV: 86.5 fL (ref 78.0–100.0)
PLATELETS: 208 10*3/uL (ref 150–400)
RBC: 3.92 MIL/uL — ABNORMAL LOW (ref 4.22–5.81)
RDW: 15.9 % — ABNORMAL HIGH (ref 11.5–15.5)
WBC: 14.6 10*3/uL — AB (ref 4.0–10.5)

## 2016-07-02 NOTE — Progress Notes (Addendum)
[facility] PROGRESS NOTE  Eduardo Salazar LNL:[id number] DOB: Sep 22, 1925 DOA: 06/25/2016   PCP: Sande Brothers, MD     Assessment/Plan: Principal Problem:   Acute diastolic CHF (congestive heart failure) (HCC) Active Problems:   CKD (chronic kidney disease) stage 3, GFR 30-59 ml/min   BPH (benign prostatic hyperplasia)   Coronary artery disease involving native heart without angina pectoris   Right ureteral calculus   Acute congestive heart failure ([place])    83 y.o. male with medical history significant of congestive heart failure, BPH, recent hospitalization from 4/13-4/15-subsequently was discharged to his assisted living facility. He was brought back to the emergency room for evaluation of worsening shortness of breath.he developed worsening shortness of breath, he was brought to the emergency room, where he was thought to have congestive heart failure and I was subsequently asked to admit this patient for further evaluation and treatment. Patient was recently admitted and discharged on 4/15-during this admission CT of abdomen and pelvis showed GB sludge, focal thickening of the rectosigmoid colon concerning for possible neoplasm as well as a nonobstructive right distal ureteral calculus. GI and urology were consulted to assist with management. .s/p right ureteroscopic laser lithotripsy and stone removal and ureteral stent placement  4/14.  Assessment and plan  Acute hypoxic respiratory failure   -  This is secondary to acute on chronic diastolic CHF, chest x-ray with no evidence of pneumonia, antibiotics has been stopped on admission  Acute on chronic diastolic CHF  - On diuresis per cardiology, volume status continues to improve, but respiratory status continues to be frail, 2-D echo with grade 2 diastolic CHF, continue with torsemide , -3.3 L since admission    Leukocytosis: -  No evidence of any infection at this time-chest x-ray does not show any overt pneumonia, UA  without UTI. Received empiric vancomycin and meropenem , would hold further antibiotics, and an attempt to diurese this patient. Repeat chest x-ray tomorrow morning, follow blood cultures. If he becomes febrile, or worsens, we will resume antimicrobial therapy. Patient is status post Right ureteral stent placement , Dr. Alinda Money. Recently discharged home on Augmentin 4 days after finishing a course of levofloxacin  Parox Atrial fibrillation:  - Management per cardiology  Dysphagia - severe Risk of aspiration, SLP input appreciated, on dysphagia 3 with nectar thick  Hypertension:  - Very soft blood pressure, continue to hold Coreg especially in the setting of diuresis Appears controlled-holding carvedilol-continue Lasix.  History of right ureteral calculus - underwent ureterocystoscopy and stone removal on 4/14.  BPH: - Continue with Flomax and finasteride     DVT prophylaxsis Lovenox  Code Status:  DNR/DNI was confirmed with daughter at bedside    Family Communication: Discussed in detail with the patient/daughter, all imaging results, lab results explained to the patient   Disposition Plan:  Pending further workup, and clinical course over the next 1-2 days, if continues to deteriorate then palliative care and comfort, otherwise if he improves discharge depends on palliative assessment      Consultants:  Cardiology  Procedures:  None  Antibiotics: Anti-infectives    Start     Dose/Rate Route Frequency Ordered Stop   07/01/16 1600  ceFEPIme (MAXIPIME) 2 g in dextrose 5 % 50 mL IVPB  Status:  Discontinued     2 g 100 mL/hr over 30 Minutes Intravenous Every 24 hours 06/14/2016 1631 07/01/2016 1846   07/01/16 0400  vancomycin (VANCOCIN) IVPB 750 mg/150 ml premix  Status:  Discontinued  750 mg 150 mL/hr over 60 Minutes Intravenous Every 12 hours 06/28/2016 1631 06/17/2016 1846   07/08/2016 1615  ceFEPIme (MAXIPIME) 2 g in dextrose 5 % 50 mL IVPB     2 g 100 mL/hr over 30  Minutes Intravenous  Once 07/07/2016 1610 07/03/2016 1748   06/16/2016 1615  vancomycin (VANCOCIN) IVPB 1000 mg/200 mL premix     1,000 mg 200 mL/hr over 60 Minutes Intravenous  Once 07/06/2016 1610 06/21/2016 1848         HPI/Subjective: Patient Passed barium swallow today, reports very weak cough, weakened debilitation,  Objective: Vitals:   07/02/16 0712 07/02/16 0759 07/02/16 1338 07/02/16 1432  BP: (!) 101/42 (!) 102/53 (!) 95/49 (!) 95/49  Pulse: 66 (!) 42 67 68  Resp: 15 17 (!) 25 14  Temp:  98 F (36.7 C) 98.7 F (37.1 C)   TempSrc:  Oral Oral   SpO2: 96% 96% 95% 95%  Weight:      Height:        Intake/Output Summary (Last 24 hours) at 07/02/16 1550 Last data filed at 07/02/16 0533  Gross per 24 hour  Intake                0 ml  Output             2850 ml  Net            -2850 ml    Exam:  Examination:  General exam:Elderly frail male, chronically ill-appearing, weak and debilitated Respiratory system: Weak respiratory effort bilaterally, no wheezing or crackles Cardiovascular system: S1 & S2 heard, RRR. No JVD, murmurs, rubs, gallops or clicks. +2 edema bilaterally Gastrointestinal system: Abdomen is nondistended, soft and nontender. No organomegaly or masses felt. Normal bowel sounds heard. Central nervous system: Alert and oriented. No focal neurological deficits. Extremities: Symmetric 5 x 5 power. Skin: No rashes, lesions or ulcers Psychiatry: Judgement impaired.     Data Reviewed: I have personally reviewed following labs and imaging studies  Micro Results Recent Results (from the past 240 hour(s))  Urine culture     Status: Abnormal   Collection Time: 06/25/16  1:04 PM  Result Value Ref Range Status   Specimen Description URINE, RANDOM  Final   Special Requests NONE  Final   Culture MULTIPLE SPECIES PRESENT, SUGGEST RECOLLECTION (A)  Final   Report Status 06/26/2016 FINAL  Final  Urine culture     Status: None   Collection Time: 06/28/16 11:06 PM   Result Value Ref Range Status   Specimen Description URINE, CATHETERIZED  Final   Special Requests NONE  Final   Culture   Final    NO GROWTH Performed at [facility], [address], [place], [place] [address]    Report Status 06/19/2016 FINAL  Final  Blood Culture (routine x 2)     Status: None (Preliminary result)   Collection Time: 07/07/2016  4:50 PM  Result Value Ref Range Status   Specimen Description BLOOD RIGHT ANTECUBITAL  Final   Special Requests   Final    BOTTLES DRAWN AEROBIC AND ANAEROBIC Blood Culture adequate volume   Culture NO GROWTH < 24 HOURS  Final   Report Status PENDING  Incomplete  Blood Culture (routine x 2)     Status: None (Preliminary result)   Collection Time: 06/19/2016  4:55 PM  Result Value Ref Range Status   Specimen Description BLOOD RIGHT HAND  Final   Special Requests   Final  BOTTLES DRAWN AEROBIC AND ANAEROBIC Blood Culture adequate volume   Culture NO GROWTH < 24 HOURS  Final   Report Status PENDING  Incomplete  MRSA PCR Screening     Status: Abnormal   Collection Time: 06/25/2016  6:52 PM  Result Value Ref Range Status   MRSA by PCR POSITIVE (A) NEGATIVE Final    Comment:        The GeneXpert MRSA Assay (FDA approved for NASAL specimens only), is one component of a comprehensive MRSA colonization surveillance program. It is not intended to diagnose MRSA infection nor to guide or monitor treatment for MRSA infections. RESULT CALLED TO, READ BACK BY AND VERIFIED WITH: M.HINES,RN AT 0142 BY L.PITT 07/01/16     Radiology Reports Dg Chest 2 View  Result Date: 06/25/2016 CLINICAL DATA:  Cough, congestion, recent flu symptoms, weakness EXAM: CHEST  2 VIEW COMPARISON:  03/02/2015 FINDINGS: Cardiomediastinal silhouette is stable. No infiltrate or pulmonary edema. Surgical clips upper paraspinal region are again noted. No infiltrate or pulmonary edema. Mild left basilar atelectasis or scarring. Degenerative changes thoracic spine.  IMPRESSION: No active cardiopulmonary disease. Electronically Signed   By: Lahoma Crocker M.D.   On: 06/25/2016 08:10   Ct Abdomen Pelvis W Contrast  Result Date: 06/25/2016 CLINICAL DATA:  Generalized abdominal pain. EXAM: CT ABDOMEN AND PELVIS WITH CONTRAST TECHNIQUE: Multidetector CT imaging of the abdomen and pelvis was performed using the standard protocol following bolus administration of intravenous contrast. CONTRAST:  45mL ISOVUE-300 IOPAMIDOL (ISOVUE-300) INJECTION 61% COMPARISON:  Radiograph same day.  CT scan of March 02, 2015. FINDINGS: Lower chest: Bronchiectasis is noted in the lower lobes bilaterally. No acute pulmonary disease is noted. Hepatobiliary: Liver appears normal. Possible sludge seen in the gallbladder lumen. Pancreas: Unremarkable. No pancreatic ductal dilatation or surrounding inflammatory changes. Spleen: Normal in size without focal abnormality. Adrenals/Urinary Tract: Adrenal glands appear normal. Severe left renal atrophy is noted. Nonobstructive calculus is noted in the left kidney. Right kidney appears normal. No hydronephrosis or right ureteral dilatation is noted. However, there is a 9 mm nonobstructive calculus seen in the distal right ureter. Urinary bladder is unremarkable. Stomach/Bowel: Diverticulosis of descending and sigmoid colon is noted without inflammation. There is no evidence of bowel obstruction. Status post appendectomy. Focal thickening of rectosigmoid junction is noted concerning for possible neoplasm. Vascular/Lymphatic: Aortic atherosclerosis. No enlarged abdominal or pelvic lymph nodes. Reproductive: Prostatic calcifications are noted. Prostate gland appears to be normal in size. Other: Small fat containing left inguinal hernia is noted. Musculoskeletal: No acute or significant osseous findings. IMPRESSION: Focal thickening of rectosigmoid junction is noted concerning for possible neoplasm. Sigmoidoscopy is recommended for further evaluation. Bronchiectasis  is noted in both lower lobes. Probable sludge seen within the gallbladder. Severe left renal atrophy with nonobstructive calculus. No hydronephrosis or ureteral dilatation is noted. However, 9 mm nonobstructive calculus is seen in the distal right ureter. Diverticulosis of descending and sigmoid colon is noted without inflammation. Aortic atherosclerosis. Electronically Signed   By: Marijo Conception, M.D.   On: 06/25/2016 10:35   Dg Chest Port 1 View  Result Date: 07/01/2016 CLINICAL DATA:  Shortness of breath. EXAM: PORTABLE CHEST 1 VIEW COMPARISON:  07/04/2016 FINDINGS: The heart is mildly enlarged but stable. The mediastinal and hilar contours are within normal limits and unchanged. Mild tortuosity of the thoracic aorta. Chronic bronchitic type interstitial lung changes but no acute overlying pulmonary findings. Stable surgical changes. The bony thorax is intact. IMPRESSION: No acute cardiopulmonary findings. Stable mild cardiac  enlargement and chronic lung changes. Electronically Signed   By: Marijo Sanes M.D.   On: 07/01/2016 12:02   Dg Chest Port 1 View  Result Date: 07/08/2016 CLINICAL DATA:  Shortness of breath. EXAM: PORTABLE CHEST 1 VIEW COMPARISON:  06/27/2016.  06/25/2016. FINDINGS: Surgical clips are noted over the chest. Cardiomegaly. Bilateral pulmonary interstitial prominence noted. Mild CHF cannot be excluded. Mild pneumonitis cannot be excluded. Findings have progressed slightly from prior exam . Underlying chronic interstitial changes are most likely present. IMPRESSION: Interim slight progression of pulmonary interstitial prominence consistent with slight progression of congestive heart failure with pulmonary interstitial edema. Interstitial pneumonitis cannot be excluded. Electronically Signed   By: Marcello Moores  Register   On: 07/04/2016 14:50   Dg Chest Port 1 View  Result Date: 06/27/2016 CLINICAL DATA:  Cough today.  Dyspnea. EXAM: PORTABLE CHEST 1 VIEW COMPARISON:  Two-view chest  x-ray 06/25/2016 FINDINGS: Heart size is normal. There is slight increase in a diffuse interstitial pattern suggesting mild edema superimposed on chronic change. Mild bibasilar atelectasis is noted. There is no significant airspace consolidation. IMPRESSION: 1. Mild edema superimposed on chronic interstitial change. 2. No significant consolidation. Electronically Signed   By: San Morelle M.D.   On: 06/27/2016 11:45   Dg Abd 2 Views  Result Date: 06/25/2016 CLINICAL DATA:  Acute generalized abdominal pain. EXAM: ABDOMEN - 2 VIEW COMPARISON:  Radiographs of May 26, 2014. FINDINGS: Mildly dilated small bowel loops are noted in the right lower quadrant which may represent ileus or possibly obstruction. No colonic dilatation is noted. Mild amount of stool is noted in the colon. No free air is noted. No definite calculi are noted. IMPRESSION: Mildly dilated small bowel loops are noted in the right lower quadrant which may represent ileus or possibly small bowel obstruction. Follow-up radiographs are recommended. Electronically Signed   By: Marijo Conception, M.D.   On: 06/25/2016 08:11   Dg Swallowing Func-speech Pathology  Result Date: 07/02/2016 Objective Swallowing Evaluation: Type of Study: MBS-Modified Barium Swallow Study Patient Details Name: Eduardo Salazar MRN: [id number] Date of Birth: 1925-05-30 Today's Date: 07/02/2016 Time: SLP Start Time (ACUTE ONLY): 1047-SLP Stop Time (ACUTE ONLY): 1112 SLP Time Calculation (min) (ACUTE ONLY): 25 min Past Medical History: Past Medical History: Diagnosis Date . BPH (benign prostatic hyperplasia)  . CKD (chronic kidney disease) stage 3, GFR 30-59 ml/min  . Hx of radiation therapy 01/15/13- 02/13/13  left temple 5000 cGy 20 sessions . Hypertension  . Myocardial infarction [facility]) 2014  mild"no symptoms" was told 2 months ago . Pyloric ulcer  . skin ca   top of head; xrt comp Past Surgical History: Past Surgical History: Procedure Laterality Date . APPENDECTOMY   .  CATARACT EXTRACTION, BILATERAL   . COLONOSCOPY WITH PROPOFOL N/A 06/26/2016  Procedure: COLONOSCOPY WITH PROPOFOL;  Surgeon: Juanita Craver, MD;  Location: [facility];  Service: Endoscopy;  Laterality: N/A; . CYSTOSCOPY W/ URETERAL STENT PLACEMENT  02/2002  on left for ureteral stone . CYSTOSCOPY WITH RETROGRADE PYELOGRAM, URETEROSCOPY AND STENT PLACEMENT Left 07/31/2012  Procedure: CYSTOSCOPY WITH LEFT RETROGRADE PYELOGRAM, LEFT URETEROSCOPY;  Surgeon: Claybon Jabs, MD;  Location: [facility];  Service: Urology;  Laterality: Left; . CYSTOSCOPY/RETROGRADE/URETEROSCOPY/STONE EXTRACTION WITH BASKET Right 06/26/2016  Procedure: CYSTOSCOPY/RIGHT RETROGRADE/RIGHT URETEROSCOPY/LASER LITHOTRIPSY WITH STONE EXTRACTION WITH BASKET/RIGHT URETERAL STENT PLACEMENT;  Surgeon: Raynelle Bring, MD;  Location: [facility];  Service: Urology;  Laterality: Right; . ESOPHAGOGASTRODUODENOSCOPY N/A 09/14/2012  Procedure: ESOPHAGOGASTRODUODENOSCOPY (EGD);  Surgeon: Jerene Bears, MD;  Location: [facility]  ENDOSCOPY;  Service: Gastroenterology;  Laterality: N/A; . ESOPHAGOGASTRODUODENOSCOPY N/A 11/14/2012  Procedure: ESOPHAGOGASTRODUODENOSCOPY (EGD);  Surgeon: Jerene Bears, MD;  Location: [facility];  Service: Gastroenterology;  Laterality: N/A; . HOLMIUM LASER APPLICATION Right 3/50/0938  Procedure: HOLMIUM LASER APPLICATION;  Surgeon: Raynelle Bring, MD;  Location: [facility];  Service: Urology;  Laterality: Right; . MOHS SURGERY Left 11/28/12  forehead . SKIN BIOPSY Left 10/27/12  left forehead near frontal scalp . VASECTOMY   . WRIST SURGERY    "nerve repair" HPI: Eduardo Salazar a 83 y.o.malewith medical history significant of congestive heart failure, BPH, recent hospitalization from 4/13-4/15-subsequently was discharged to his assisted living facility. He was brought back to the emergency room for evaluation of worsening shortness of breath.  Pt has been observed to cough with thin liquids and pills. Denies difficulty swallowing PTA.  No Data Recorded  Assessment / Plan / Recommendation CHL IP CLINICAL IMPRESSIONS 07/02/2016 Clinical Impression Pt demonstrates a moderate to severe oropharyngeal dysphagia with late swallow initiation and diffuse oropharyngeal weakness. Pt silently aspirates thin liquids before the swallow. Cued cough ejects majority of aspirate, but elicits patient discomfort as cough is prolonged and congested. Pharyngeal constriction and persitalsis are weak, hyoid excursion minimal resulting in poor epiglottic deflection with moderate vallecular and pyriform residuals. With thins, pyriform residuals are aspirated post swallow as well. Nectar thick liquids reduce immediate aspiration, though residue is poorly mobilized with cued second swallows. This improves minimally with a chin tuck. Discussed with pts daughter who was present and initiated therapy interventions post MBS, see next note. For now, pt to continue current diet of dys 3/nectar thick liquids with a chin tuck and two swallows. Expect ongoing aspiration events and difficulty with pt consistency with strategies given fatigue and weakness. Expectation of diet tolerance or improvement with therapy guarded.  SLP Visit Diagnosis Dysphagia, oropharyngeal phase (R13.12) Attention and concentration deficit following -- Frontal lobe and executive function deficit following -- Impact on safety and function Severe aspiration risk;Risk for inadequate nutrition/hydration   CHL IP TREATMENT RECOMMENDATION 07/02/2016 Treatment Recommendations Therapy as outlined in treatment plan below   No flowsheet data found. CHL IP DIET RECOMMENDATION 07/02/2016 SLP Diet Recommendations Dysphagia 3 (Mech soft) solids;Nectar thick liquid Liquid Administration via Cup;Straw Medication Administration Crushed with puree Compensations Slow rate;Small sips/bites;Effortful swallow;Chin tuck;Use straw to facilitate chin tuck Postural Changes Seated upright at 90 degrees;Remain semi-upright after after feeds/meals (Comment)    CHL IP OTHER RECOMMENDATIONS 07/02/2016 Recommended Consults -- Oral Care Recommendations Oral care QID Other Recommendations Order thickener from pharmacy   CHL IP FOLLOW UP RECOMMENDATIONS 07/02/2016 Follow up Recommendations Inpatient Rehab   CHL IP FREQUENCY AND DURATION 07/02/2016 Speech Therapy Frequency (ACUTE ONLY) min 2x/week Treatment Duration 2 weeks      CHL IP ORAL PHASE 07/02/2016 Oral Phase WFL Oral - Pudding Teaspoon -- Oral - Pudding Cup -- Oral - Honey Teaspoon -- Oral - Honey Cup -- Oral - Nectar Teaspoon -- Oral - Nectar Cup -- Oral - Nectar Straw -- Oral - Thin Teaspoon -- Oral - Thin Cup -- Oral - Thin Straw -- Oral - Puree -- Oral - Mech Soft -- Oral - Regular -- Oral - Multi-Consistency -- Oral - Pill -- Oral Phase - Comment --  CHL IP PHARYNGEAL PHASE 07/02/2016 Pharyngeal Phase Impaired Pharyngeal- Pudding Teaspoon -- Pharyngeal -- Pharyngeal- Pudding Cup -- Pharyngeal -- Pharyngeal- Honey Teaspoon -- Pharyngeal -- Pharyngeal- Honey Cup -- Pharyngeal -- Pharyngeal- Nectar Teaspoon -- Pharyngeal -- Pharyngeal- Nectar Cup  Delayed swallow initiation-pyriform sinuses;Reduced pharyngeal peristalsis;Reduced epiglottic inversion;Reduced anterior laryngeal mobility;Reduced tongue base retraction;Pharyngeal residue - valleculae;Pharyngeal residue - pyriform;Compensatory strategies attempted (with notebox);Penetration/Apiration after swallow Pharyngeal Material enters airway, remains ABOVE vocal cords and not ejected out;Material does not enter airway Pharyngeal- Nectar Straw Delayed swallow initiation-pyriform sinuses;Reduced pharyngeal peristalsis;Reduced epiglottic inversion;Reduced anterior laryngeal mobility;Reduced tongue base retraction;Pharyngeal residue - valleculae;Pharyngeal residue - pyriform;Compensatory strategies attempted (with notebox);Penetration/Apiration after swallow Pharyngeal Material does not enter airway Pharyngeal- Thin Teaspoon -- Pharyngeal -- Pharyngeal- Thin Cup Delayed  swallow initiation-pyriform sinuses;Reduced pharyngeal peristalsis;Reduced epiglottic inversion;Reduced anterior laryngeal mobility;Reduced tongue base retraction;Pharyngeal residue - valleculae;Pharyngeal residue - pyriform;Compensatory strategies attempted (with notebox);Moderate aspiration;Penetration/Apiration after swallow;Penetration/Aspiration before swallow Pharyngeal Material enters airway, passes BELOW cords without attempt by patient to eject out (silent aspiration);Material enters airway, remains ABOVE vocal cords and not ejected out Pharyngeal- Thin Straw -- Pharyngeal -- Pharyngeal- Puree Delayed swallow initiation-vallecula;Reduced epiglottic inversion;Reduced pharyngeal peristalsis;Reduced anterior laryngeal mobility;Reduced tongue base retraction;Pharyngeal residue - valleculae;Pharyngeal residue - pyriform;Compensatory strategies attempted (with notebox) Pharyngeal -- Pharyngeal- Mechanical Soft Delayed swallow initiation-vallecula;Reduced epiglottic inversion;Reduced pharyngeal peristalsis;Reduced anterior laryngeal mobility;Reduced tongue base retraction;Pharyngeal residue - valleculae;Pharyngeal residue - pyriform;Compensatory strategies attempted (with notebox) Pharyngeal -- Pharyngeal- Regular -- Pharyngeal -- Pharyngeal- Multi-consistency -- Pharyngeal -- Pharyngeal- Pill Delayed swallow initiation-vallecula;Reduced epiglottic inversion;Reduced pharyngeal peristalsis;Reduced anterior laryngeal mobility;Reduced tongue base retraction;Pharyngeal residue - valleculae;Pharyngeal residue - pyriform;Compensatory strategies attempted (with notebox) Pharyngeal -- Pharyngeal Comment --  No flowsheet data found. No flowsheet data found. Herbie Baltimore, MA CCC-SLP [phone] Lynann Beaver 07/02/2016, 12:08 PM              Dg C-arm 1-60 Min-no Report  Result Date: 06/26/2016 Fluoroscopy was utilized by the requesting physician.  No radiographic interpretation.     CBC  Recent Labs Lab  06/26/16 0613 06/28/16 2250 07/04/2016 1528 07/01/16 0248 07/02/16 0410  WBC 7.7 9.4 19.2* 14.4* 14.6*  HGB 12.1* 12.4* 12.6* 11.4* 11.1*  HCT 37.1* 37.2* 37.5* 35.0* 33.9*  PLT 172 172 187 176 208  MCV 85.9 86.5 85.8 86.2 86.5  MCH 28.0 28.8 28.8 28.1 28.3  MCHC 32.6 33.3 33.6 32.6 32.7  RDW 15.1 15.6* 15.9* 15.8* 15.9*  LYMPHSABS  --  2.3 1.7  --   --   MONOABS  --  1.1* 0.6  --   --   EOSABS  --  0.2 0.0  --   --   BASOSABS  --  0.0 0.0  --   --     Chemistries   Recent Labs Lab 06/27/16 0616 06/28/16 2250 06/29/2016 1422 07/01/16 0248 07/01/16 1824 07/02/16 0410  NA 137 139 137 136  --  139  K 4.4 3.9 4.3 3.9  --  3.5  CL 110 112* 109 106  --  102  CO2 19* 21* 21* 24  --  26  GLUCOSE 126* 122* 140* 161*  --  126*  BUN 16 15 12 17   --  31*  CREATININE 1.23 1.28* 1.17 1.34*  --  1.57*  CALCIUM 7.7* 8.3* 8.4* 8.2*  --  8.6*  MG 1.9  --   --   --  2.0  --   AST  --   --  27  --   --  27  ALT  --   --  21  --   --  23  ALKPHOS  --   --  83  --   --  90  BILITOT  --   --  1.0  --   --  0.5   ------------------------------------------------------------------------------------------------------------------ estimated creatinine clearance is 33.9 mL/min (A) (by C-G formula based on SCr of 1.57 mg/dL (H)). ------------------------------------------------------------------------------------------------------------------ No results for input(s): HGBA1C in the last 72 hours. ------------------------------------------------------------------------------------------------------------------ No results for input(s): CHOL, HDL, LDLCALC, TRIG, CHOLHDL, LDLDIRECT in the last 72 hours. ------------------------------------------------------------------------------------------------------------------  Recent Labs  07/01/16 1056  TSH 0.770   ------------------------------------------------------------------------------------------------------------------ No results for input(s):  VITAMINB12, FOLATE, FERRITIN, TIBC, IRON, RETICCTPCT in the last 72 hours.  Coagulation profile  Recent Labs Lab 06/19/2016 1422  INR 1.19    No results for input(s): DDIMER in the last 72 hours.  Cardiac Enzymes  Recent Labs Lab 07/01/16 2000  TROPONINI 0.24*   ------------------------------------------------------------------------------------------------------------------ Invalid input(s): POCBNP   CBG: No results for input(s): GLUCAP in the last 168 hours.     Studies: Dg Chest Port 1 View  Result Date: 07/01/2016 CLINICAL DATA:  Shortness of breath. EXAM: PORTABLE CHEST 1 VIEW COMPARISON:  07/06/2016 FINDINGS: The heart is mildly enlarged but stable. The mediastinal and hilar contours are within normal limits and unchanged. Mild tortuosity of the thoracic aorta. Chronic bronchitic type interstitial lung changes but no acute overlying pulmonary findings. Stable surgical changes. The bony thorax is intact. IMPRESSION: No acute cardiopulmonary findings. Stable mild cardiac enlargement and chronic lung changes. Electronically Signed   By: Marijo Sanes M.D.   On: 07/01/2016 12:02   Dg Swallowing Func-speech Pathology  Result Date: 07/02/2016 Objective Swallowing Evaluation: Type of Study: MBS-Modified Barium Swallow Study Patient Details Name: Eduardo Salazar MRN: [id number] Date of Birth: 09-16-1925 Today's Date: 07/02/2016 Time: SLP Start Time (ACUTE ONLY): 1047-SLP Stop Time (ACUTE ONLY): 1112 SLP Time Calculation (min) (ACUTE ONLY): 25 min Past Medical History: Past Medical History: Diagnosis Date . BPH (benign prostatic hyperplasia)  . CKD (chronic kidney disease) stage 3, GFR 30-59 ml/min  . Hx of radiation therapy 01/15/13- 02/13/13  left temple 5000 cGy 20 sessions . Hypertension  . Myocardial infarction [facility]) 2014  mild"no symptoms" was told 2 months ago . Pyloric ulcer  . skin ca   top of head; xrt comp Past Surgical History: Past Surgical History: Procedure Laterality Date .  APPENDECTOMY   . CATARACT EXTRACTION, BILATERAL   . COLONOSCOPY WITH PROPOFOL N/A 06/26/2016  Procedure: COLONOSCOPY WITH PROPOFOL;  Surgeon: Juanita Craver, MD;  Location: [facility];  Service: Endoscopy;  Laterality: N/A; . CYSTOSCOPY W/ URETERAL STENT PLACEMENT  02/2002  on left for ureteral stone . CYSTOSCOPY WITH RETROGRADE PYELOGRAM, URETEROSCOPY AND STENT PLACEMENT Left 07/31/2012  Procedure: CYSTOSCOPY WITH LEFT RETROGRADE PYELOGRAM, LEFT URETEROSCOPY;  Surgeon: Claybon Jabs, MD;  Location: [facility];  Service: Urology;  Laterality: Left; . CYSTOSCOPY/RETROGRADE/URETEROSCOPY/STONE EXTRACTION WITH BASKET Right 06/26/2016  Procedure: CYSTOSCOPY/RIGHT RETROGRADE/RIGHT URETEROSCOPY/LASER LITHOTRIPSY WITH STONE EXTRACTION WITH BASKET/RIGHT URETERAL STENT PLACEMENT;  Surgeon: Raynelle Bring, MD;  Location: [facility];  Service: Urology;  Laterality: Right; . ESOPHAGOGASTRODUODENOSCOPY N/A 09/14/2012  Procedure: ESOPHAGOGASTRODUODENOSCOPY (EGD);  Surgeon: Jerene Bears, MD;  Location: [facility];  Service: Gastroenterology;  Laterality: N/A; . ESOPHAGOGASTRODUODENOSCOPY N/A 11/14/2012  Procedure: ESOPHAGOGASTRODUODENOSCOPY (EGD);  Surgeon: Jerene Bears, MD;  Location: [facility];  Service: Gastroenterology;  Laterality: N/A; . HOLMIUM LASER APPLICATION Right 7/34/1937  Procedure: HOLMIUM LASER APPLICATION;  Surgeon: Raynelle Bring, MD;  Location: [facility];  Service: Urology;  Laterality: Right; . MOHS SURGERY Left 11/28/12  forehead . SKIN BIOPSY Left 10/27/12  left forehead near frontal scalp . VASECTOMY   . WRIST SURGERY    "nerve repair" HPI: Eduardo Salazar a 83 y.o.malewith  medical history significant of congestive heart failure, BPH, recent hospitalization from 4/13-4/15-subsequently was discharged to his assisted living facility. He was brought back to the emergency room for evaluation of worsening shortness of breath.  Pt has been observed to cough with thin liquids and pills. Denies difficulty swallowing PTA.  No  Data Recorded Assessment / Plan / Recommendation CHL IP CLINICAL IMPRESSIONS 07/02/2016 Clinical Impression Pt demonstrates a moderate to severe oropharyngeal dysphagia with late swallow initiation and diffuse oropharyngeal weakness. Pt silently aspirates thin liquids before the swallow. Cued cough ejects majority of aspirate, but elicits patient discomfort as cough is prolonged and congested. Pharyngeal constriction and persitalsis are weak, hyoid excursion minimal resulting in poor epiglottic deflection with moderate vallecular and pyriform residuals. With thins, pyriform residuals are aspirated post swallow as well. Nectar thick liquids reduce immediate aspiration, though residue is poorly mobilized with cued second swallows. This improves minimally with a chin tuck. Discussed with pts daughter who was present and initiated therapy interventions post MBS, see next note. For now, pt to continue current diet of dys 3/nectar thick liquids with a chin tuck and two swallows. Expect ongoing aspiration events and difficulty with pt consistency with strategies given fatigue and weakness. Expectation of diet tolerance or improvement with therapy guarded.  SLP Visit Diagnosis Dysphagia, oropharyngeal phase (R13.12) Attention and concentration deficit following -- Frontal lobe and executive function deficit following -- Impact on safety and function Severe aspiration risk;Risk for inadequate nutrition/hydration   CHL IP TREATMENT RECOMMENDATION 07/02/2016 Treatment Recommendations Therapy as outlined in treatment plan below   No flowsheet data found. CHL IP DIET RECOMMENDATION 07/02/2016 SLP Diet Recommendations Dysphagia 3 (Mech soft) solids;Nectar thick liquid Liquid Administration via Cup;Straw Medication Administration Crushed with puree Compensations Slow rate;Small sips/bites;Effortful swallow;Chin tuck;Use straw to facilitate chin tuck Postural Changes Seated upright at 90 degrees;Remain semi-upright after after  feeds/meals (Comment)   CHL IP OTHER RECOMMENDATIONS 07/02/2016 Recommended Consults -- Oral Care Recommendations Oral care QID Other Recommendations Order thickener from pharmacy   CHL IP FOLLOW UP RECOMMENDATIONS 07/02/2016 Follow up Recommendations Inpatient Rehab   CHL IP FREQUENCY AND DURATION 07/02/2016 Speech Therapy Frequency (ACUTE ONLY) min 2x/week Treatment Duration 2 weeks      CHL IP ORAL PHASE 07/02/2016 Oral Phase WFL Oral - Pudding Teaspoon -- Oral - Pudding Cup -- Oral - Honey Teaspoon -- Oral - Honey Cup -- Oral - Nectar Teaspoon -- Oral - Nectar Cup -- Oral - Nectar Straw -- Oral - Thin Teaspoon -- Oral - Thin Cup -- Oral - Thin Straw -- Oral - Puree -- Oral - Mech Soft -- Oral - Regular -- Oral - Multi-Consistency -- Oral - Pill -- Oral Phase - Comment --  CHL IP PHARYNGEAL PHASE 07/02/2016 Pharyngeal Phase Impaired Pharyngeal- Pudding Teaspoon -- Pharyngeal -- Pharyngeal- Pudding Cup -- Pharyngeal -- Pharyngeal- Honey Teaspoon -- Pharyngeal -- Pharyngeal- Honey Cup -- Pharyngeal -- Pharyngeal- Nectar Teaspoon -- Pharyngeal -- Pharyngeal- Nectar Cup Delayed swallow initiation-pyriform sinuses;Reduced pharyngeal peristalsis;Reduced epiglottic inversion;Reduced anterior laryngeal mobility;Reduced tongue base retraction;Pharyngeal residue - valleculae;Pharyngeal residue - pyriform;Compensatory strategies attempted (with notebox);Penetration/Apiration after swallow Pharyngeal Material enters airway, remains ABOVE vocal cords and not ejected out;Material does not enter airway Pharyngeal- Nectar Straw Delayed swallow initiation-pyriform sinuses;Reduced pharyngeal peristalsis;Reduced epiglottic inversion;Reduced anterior laryngeal mobility;Reduced tongue base retraction;Pharyngeal residue - valleculae;Pharyngeal residue - pyriform;Compensatory strategies attempted (with notebox);Penetration/Apiration after swallow Pharyngeal Material does not enter airway Pharyngeal- Thin Teaspoon -- Pharyngeal --  Pharyngeal- Thin Cup Delayed swallow initiation-pyriform sinuses;Reduced pharyngeal peristalsis;Reduced epiglottic  inversion;Reduced anterior laryngeal mobility;Reduced tongue base retraction;Pharyngeal residue - valleculae;Pharyngeal residue - pyriform;Compensatory strategies attempted (with notebox);Moderate aspiration;Penetration/Apiration after swallow;Penetration/Aspiration before swallow Pharyngeal Material enters airway, passes BELOW cords without attempt by patient to eject out (silent aspiration);Material enters airway, remains ABOVE vocal cords and not ejected out Pharyngeal- Thin Straw -- Pharyngeal -- Pharyngeal- Puree Delayed swallow initiation-vallecula;Reduced epiglottic inversion;Reduced pharyngeal peristalsis;Reduced anterior laryngeal mobility;Reduced tongue base retraction;Pharyngeal residue - valleculae;Pharyngeal residue - pyriform;Compensatory strategies attempted (with notebox) Pharyngeal -- Pharyngeal- Mechanical Soft Delayed swallow initiation-vallecula;Reduced epiglottic inversion;Reduced pharyngeal peristalsis;Reduced anterior laryngeal mobility;Reduced tongue base retraction;Pharyngeal residue - valleculae;Pharyngeal residue - pyriform;Compensatory strategies attempted (with notebox) Pharyngeal -- Pharyngeal- Regular -- Pharyngeal -- Pharyngeal- Multi-consistency -- Pharyngeal -- Pharyngeal- Pill Delayed swallow initiation-vallecula;Reduced epiglottic inversion;Reduced pharyngeal peristalsis;Reduced anterior laryngeal mobility;Reduced tongue base retraction;Pharyngeal residue - valleculae;Pharyngeal residue - pyriform;Compensatory strategies attempted (with notebox) Pharyngeal -- Pharyngeal Comment --  No flowsheet data found. No flowsheet data found. Herbie Baltimore, MA CCC-SLP [phone] [place], Katherene Ponto 07/02/2016, 12:08 PM                 No results found for: HGBA1C Lab Results  Component Value Date   CREATININE 1.57 (H) 07/02/2016       Scheduled Meds: .  Chlorhexidine Gluconate Cloth  6 each Topical Q0600  . enoxaparin (LOVENOX) injection  40 mg Subcutaneous Q24H  . finasteride  5 mg Oral Daily  . guaiFENesin  1,200 mg Oral BID  . ipratropium-albuterol  3 mL Nebulization TID  . mouth rinse  15 mL Mouth Rinse BID  . mupirocin ointment  1 application Nasal BID  . sodium chloride flush  3 mL Intravenous Q12H  . tamsulosin  0.4 mg Oral BID  . torsemide  40 mg Oral BID   Continuous Infusions: . diltiazem (CARDIZEM) infusion Stopped (07/01/16 1000)     LOS: 2 days     [facility], Jaspreet Bodner MD Pager [phone] [facility] Pager [phone]. If 7PM-7AM, please contact night-coverage at [email or web address], password [facility] 07/02/2016, 3:50 PM  LOS: 2 days

## 2016-07-02 NOTE — Progress Notes (Signed)
  Speech Language Pathology Treatment: Dysphagia  Patient Details Name: Eduardo Salazar MRN: [id number] DOB: [birth year] Today's Date: 07/02/2016 Time: 8850-2774 SLP Time Calculation (min) (ACUTE ONLY): 12 min  Assessment / Plan / Recommendation Clinical Impression  Provided f/u education and verbal reinforcement of diet recommendations and strategies to pt and family.  Reiterated ongoing risk of aspiration with PO. Initiated instruction in chin tuck against resistance exercise with daughter with demonstration and written exercise. Pt to complete chin tuck against resistance with a rolled towel 10x for 3 sets, 3x a day over the weekend with family supervision and assist. Will f/u for reinforcement next week.   HPI HPI: Eduardo Salazar a 83 y.o.malewith medical history significant of congestive heart failure, BPH, recent hospitalization from 4/13-4/15-subsequently was discharged to his assisted living facility. He was brought back to the emergency room for evaluation of worsening shortness of breath.  Pt has been observed to cough with thin liquids and pills. Denies difficulty swallowing PTA.       SLP Plan  Continue with current plan of care       Recommendations  Diet recommendations: Nectar-thick liquid;Dysphagia 3 (mechanical soft) Liquids provided via: Cup;Straw Medication Administration: Crushed with puree Supervision: Full supervision/cueing for compensatory strategies Compensations: Slow rate;Small sips/bites;Effortful swallow;Chin tuck;Use straw to facilitate chin tuck                Follow up Recommendations: Inpatient Rehab SLP Visit Diagnosis: Dysphagia, oropharyngeal phase (R13.12) Plan: Continue with current plan of care       GO                [name], Katherene Ponto 07/02/2016, 12:12 PM

## 2016-07-02 NOTE — Care Management Note (Signed)
Case Management Note  Patient Details  Name: Eduardo Salazar MRN: [id number] Date of Birth: Apr 10, 1925  Subjective/Objective:  Pt admitted with acute diastolic CHF                  Action/Plan:   PTA from ALF with wife.  Recommendations now are for SNF - CSW consulted   Expected Discharge Date:                  Expected Discharge Plan:  [place]  In-House Referral:  Clinical Social Work  Discharge planning Services  CM Consult  Post Acute Care Choice:    Choice offered to:     DME Arranged:    DME Agency:     HH Arranged:    HH Agency:     Status of Service:     If discussed at [employer], dates discussed:    Additional Comments:  Maryclare Labrador, RN 07/02/2016, 8:58 AM

## 2016-07-02 NOTE — Progress Notes (Addendum)
Subjective:  Still short of breath  Objective:  Vital Signs in the last 24 hours: Temp:  [97.7 F (36.5 C)-98.4 F (36.9 C)] 98 F (36.7 C) (04/20 0759) Pulse Rate:  [40-116] 42 (04/20 0759) Resp:  [13-27] 17 (04/20 0759) BP: (77-134)/(42-67) 102/53 (04/20 0759) SpO2:  [95 %-100 %] 96 % (04/20 0759) Weight:  [85.3 kg (188 lb 1.6 oz)] 85.3 kg (188 lb 1.6 oz) (04/20 0531)  Intake/Output from previous day: 04/19 0701 - 04/20 0700 In: 483 [P.O.:480; I.V.:3] Out: 3250 [Urine:3250]  Physical Exam: Blood pressure (!) 95/49, pulse 67, temperature 98.7 F (37.1 C), temperature source Oral, resp. rate (!) 25, height 5' 7" (1.702 m), weight 85.3 kg (188 lb 1.6 oz), SpO2 95 %. General appearance: alert, cooperative and moderate distress Neck: no carotid bruit, supple, symmetrical, trachea midline, thyroid not enlarged, symmetric, no tenderness/mass/nodules and JVD above the angle of the jaw. Lungs: Bilateral diffuse extensive crackles heard throughout anterior and posterior chest fields. Heart: S1, S2 normal, no S3 or S4 and systolic murmur: early systolic 2/6, blowing at lower left sternal border, at apex Abdomen: Abdomen is mildly distended, ascites present, bowel sounds heard in all 4 quadrants. Mild hepatomegaly present. Extremities: 2+ leg edema. Full range of movements. Pulses: Carotids normal, femorals normal, popliteals could not be felt, pedal pulses could not be felt due to edema. Neurologic: Grossly normal Lab Results: BMP  Recent Labs  06/20/2016 1422 07/01/16 0248 07/02/16 0410  NA 137 136 139  K 4.3 3.9 3.5  CL 109 106 102  CO2 21* 24 26  GLUCOSE 140* 161* 126*  BUN 12 17 31*  CREATININE 1.17 1.34* 1.57*  CALCIUM 8.4* 8.2* 8.6*  GFRNONAA 54* 46* 38*  GFRAA >60 53* 44*    CBC  Recent Labs Lab 06/23/2016 1528  07/02/16 0410  WBC 19.2*  < > 14.6*  RBC 4.37  < > 3.92*  HGB 12.6*  < > 11.1*  HCT 37.5*  < > 33.9*  PLT 187  < > 208  MCV 85.8  < > 86.5  MCH 28.8   < > 28.3  MCHC 33.6  < > 32.7  RDW 15.9*  < > 15.9*  LYMPHSABS 1.7  --   --   MONOABS 0.6  --   --   EOSABS 0.0  --   --   BASOSABS 0.0  --   --   < > = values in this interval not displayed.   Recent Labs  06/25/16 0744 07/01/16 2000  TROPONINI <0.03 0.24*    TSH  Recent Labs  07/01/16 1056  TSH 0.770   Recent Labs  06/25/16 0744 07/11/2016 1422 07/02/16 0410  PROT 7.3 5.9* 6.2*  ALBUMIN 3.4* 2.6* 2.4*  AST _0 ALT _1 ALKPHOS 100 83 90  BILITOT 0.7 1.0 0.5   Imaging: Imaging results have been reviewed  Cardiac Studies:  EKG 07/01/2016: Sinus rhythm with sinus arrhythmia, normal axis, nonspecific T-wave abnormality, cannot exclude anterolateral ischemia. Prolonged QT. Compared to 06/27/2016, atrial fibrillation is no longer present. ST abnormalities.  Echocardiogram: Low normal LVEF at 67-61%, grade 2 diastolic dysfunction, moderate to severe posteriorly directed MR. Trace tricuspid regurgitation. Unable to estimate PA pressure.   Assessment/Plan:  1. Acute on chronic diastolic heart failure, suspect non-ST elevation MI with positive S. Troponins, mitral regurgitation may be contributing. 2. Paroxysmal atrial fibrillation 3. Chronic stage III kidney disease 4. Hypertension 5. Superficial decubitus ulcer in the lower back  without infection or discharge. 6. Acute on chronic renal failure due to Cardiorenal Syndrome  Rec:  Patient is critically ill and continues to be in florid congestive heart failure.  Continue diuresis with Demadex he has had excellent response.  He is negative for 4 L of fluid.  However her renal function has deteriorated which is suspect it would with diuresis.  I will discontinue spironolactone, continue Demadex.  Not on beta blocker or ACE inhibitor due to hypotension and renal failure.  If blood pressure stabilizes, the could consider BiDil.  Patient has high risk of in-hospital mortality.  Patient has been made DO NOT  RESUSCITATE.  I met with the patient's family, her daughter and after long discussions, palliative care will be consulted.  I suspect his survival is less than 6 months.  He will need outpatient referral to palliative care also off on discharge if he indeed survive this hospitalization.  Adrian Prows, M.D. 07/02/2016, 1:36 PM [facility], [place] Pager: [phone] Office: [phone] If no answer: [phone]

## 2016-07-02 NOTE — Progress Notes (Signed)
Modified Barium Swallow Progress Note  Patient Details  Name: Eduardo Salazar MRN: [id number] Date of Birth: [birth year]  Today's Date: 07/02/2016  Modified Barium Swallow completed.  Full report located under Chart Review in the Imaging Section.  Brief recommendations include the following:  Clinical Impression  Pt demonstrates a moderate to severe oropharyngeal dysphagia with late swallow initiation and diffuse oropharyngeal weakness. Pt silently aspirates thin liquids before the swallow. Cued cough ejects majority of aspirate, but elicits patient discomfort as cough is prolonged and congested. Pharyngeal constriction and persitalsis are weak, hyoid excursion minimal resulting in poor epiglottic deflection with moderate vallecular and pyriform residuals. With thins, pyriform residuals are aspirated post swallow as well. Nectar thick liquids reduce immediate aspiration, though residue is poorly mobilized with cued second swallows. This improves minimally with a chin tuck. Discussed with pts daughter who was present and initiated therapy interventions post MBS, see next note. For now, pt to continue current diet of dys 3/nectar thick liquids with a chin tuck and two swallows. Expect ongoing aspiration events and difficulty with pt consistency with strategies given fatigue and weakness. Expectation of diet tolerance or improvement with therapy guarded.    Swallow Evaluation Recommendations       SLP Diet Recommendations: Dysphagia 3 (Mech soft) solids;Nectar thick liquid   Liquid Administration via: Cup;Straw   Medication Administration: Crushed with puree   Supervision: Patient able to self feed;Full supervision/cueing for compensatory strategies   Compensations: Slow rate;Small sips/bites;Effortful swallow;Chin tuck;Use straw to facilitate chin tuck   Postural Changes: Seated upright at 90 degrees;Remain semi-upright after after feeds/meals (Comment)   Oral Care Recommendations: Oral  care QID   Other Recommendations: Order thickener from [place], [place] CCC-SLP [phone]  Lynann Beaver 07/02/2016,12:06 PM

## 2016-07-03 ENCOUNTER — Inpatient Hospital Stay (HOSPITAL_COMMUNITY): Payer: Medicare Other

## 2016-07-03 ENCOUNTER — Encounter (HOSPITAL_COMMUNITY): Payer: Self-pay | Admitting: *Deleted

## 2016-07-03 DIAGNOSIS — D72829 Elevated white blood cell count, unspecified: Secondary | ICD-10-CM

## 2016-07-03 DIAGNOSIS — R1310 Dysphagia, unspecified: Secondary | ICD-10-CM

## 2016-07-03 DIAGNOSIS — I2510 Atherosclerotic heart disease of native coronary artery without angina pectoris: Secondary | ICD-10-CM

## 2016-07-03 DIAGNOSIS — R0602 Shortness of breath: Secondary | ICD-10-CM

## 2016-07-03 DIAGNOSIS — Z515 Encounter for palliative care: Secondary | ICD-10-CM

## 2016-07-03 MED ORDER — FLEET ENEMA 7-19 GM/118ML RE ENEM
1.0000 | ENEMA | Freq: Every day | RECTAL | Status: DC | PRN
  Filled 2016-07-03: qty 1

## 2016-07-03 MED ORDER — SENNOSIDES-DOCUSATE SODIUM 8.6-50 MG PO TABS: 1.0000 | ORAL_TABLET | Freq: Two times a day (BID) | ORAL | Status: DC

## 2016-07-03 MED ORDER — POLYETHYLENE GLYCOL 3350 17 G PO PACK: 17.0000 g | PACK | Freq: Every day | ORAL | Status: DC

## 2016-07-03 NOTE — Progress Notes (Signed)
Subjective:  Continues to have shortness of breath. Patient states doing okay.  Objective:  Vital Signs in the last 24 hours: Temp:  [97.8 F (36.6 C)-99.3 F (37.4 C)] 97.8 F (36.6 C) (04/21 0722) Pulse Rate:  [42-101] 101 (04/21 0722) Resp:  [14-25] 18 (04/21 0722) BP: (90-111)/(46-56) 106/46 (04/21 0722) SpO2:  [93 %-98 %] 93 % (04/21 0722) Weight:  [84.4 kg (186 lb)] 84.4 kg (186 lb) (04/21 0600)  Intake/Output from previous day: 04/20 0701 - 04/21 0700 In: -  Out: 4600 [Urine:4600]  Physical Exam:  General appearance: alert, cooperative and moderate distress Neck: no carotid bruit, supple, symmetrical, trachea midline, thyroid not enlarged, symmetric, no tenderness/mass/nodules and JVD above the angle of the jaw. Lungs: Bilateral diffuse extensive crackles heard throughout anterior and posterior chest fields. Heart: S1, S2 normal, no S3 or S4 and systolic murmur: early [id number]/4, blowingat lower left sternal border, at apex Abdomen: Abdomen is mildly distended, ascites present, bowel sounds heard in all 4 quadrants. Mild hepatomegaly present. Extremities: 2+ leg edema. Full range of movements. Pulses: Carotids normal, femorals normal, popliteals could not be felt, pedal pulses could not be felt due to edema. Neurologic: Grossly normal  Lab Results: BMP  Recent Labs  06/19/2016 1422 07/01/16 0248 07/02/16 0410  NA 137 136 139  K 4.3 3.9 3.5  CL 109 106 102  CO2 21* 24 26  GLUCOSE 140* 161* 126*  BUN 12 17 31*  CREATININE 1.17 1.34* 1.57*  CALCIUM 8.4* 8.2* 8.6*  GFRNONAA 54* 46* 38*  GFRAA >60 53* 44*    CBC  Recent Labs Lab 07/02/2016 1528  07/02/16 0410  WBC 19.2*  < > 14.6*  RBC 4.37  < > 3.92*  HGB 12.6*  < > 11.1*  HCT 37.5*  < > 33.9*  PLT 187  < > 208  MCV 85.8  < > 86.5  MCH 28.8  < > 28.3  MCHC 33.6  < > 32.7  RDW 15.9*  < > 15.9*  LYMPHSABS 1.7  --   --   MONOABS 0.6  --   --   EOSABS 0.0  --   --   BASOSABS 0.0  --   --   < > =  values in this interval not displayed.  HEMOGLOBIN A1C No results found for: HGBA1C, MPG  Cardiac Panel (last 3 results)  Recent Labs  06/25/16 0744 07/01/16 2000  TROPONINI <0.03 0.24*    BNP (last 3 results) No results for input(s): PROBNP in the last 8760 hours.  TSH  Recent Labs  07/01/16 1056  TSH 0.770    CHOLESTEROL No results for input(s): CHOL in the last 8760 hours.  Hepatic Function Panel  Recent Labs  06/25/16 0744 06/23/2016 1422 07/02/16 0410  PROT 7.3 5.9* 6.2*  ALBUMIN 3.4* 2.6* 2.4*  AST '21 27 27  '$ ALT '17 21 23  '$ ALKPHOS 100 83 90  BILITOT 0.7 1.0 0.5    Imaging: Imaging results have been reviewed  Cardiac Studies:  EKG 07/01/2016: Sinus rhythm with sinus arrhythmia, normal axis, nonspecific T-wave abnormality, cannot exclude anterolateral ischemia.Prolonged QT.Compared to 07/09/2016, atrial fibrillation is no longer present. ST abnormalities.  Echocardiogram: Low normal LVEF at 74-25%, grade 2 diastolic dysfunction, moderate to severe posteriorly directed MR. Trace tricuspid regurgitation. Unable to estimate PA pressure.   Assessment/Plan:  1. Acute on chronic diastolic heart failure, suspect non-ST elevation MI with positive S. Troponins, mitral regurgitation may be contributing. 2. Paroxysmal atrial fibrillation 3. Chronic stage  III kidney disease 4. Hypertension 5. Superficial decubitus ulcer in the lower back without infection or discharge. 6. Acute on chronic renal failure due to Cardiorenal Syndrome  PBD:HDIXBOE remains critically ill and continues to be in congestive heart failure.  Continue diuresis with Demadex he has had excellent response.  He is negative for 4 L of fluid, continue with Demadex. Spironolactone discontinued yesterday due to worsening renal function likely related to diuresis. Will continue to hold beta blocker or ACE inhibitor due to hypotension and renal failure.  If blood pressure stabilizes, the could consider  BiDil.  Patient has high risk of in-hospital mortality.  Patient has now been made DO NOT RESUSCITATE. Dr. Einar Gip met with the patient's family yesterday, and after long discussions, palliative care was consulted and has met with the family today.    He will need outpatient referral to palliative care also off on discharge if he indeed survive this hospitalization.  Miquel Dunn, FNP-C. 07/03/2016, 8:47 AM [facility] Pager: [phone] Office: [phone] If no answer: [phone]

## 2016-07-03 NOTE — Progress Notes (Signed)
[facility] PROGRESS NOTE  Eduardo Salazar ZOX:[id number] DOB: March 24, 1925 DOA: 06/15/2016 PCP: Sande Brothers, MD  Brief summary  Principal Problem:   Acute diastolic CHF (congestive heart failure) ([place]) Active Problems:   CKD (chronic kidney disease) stage 3, GFR 30-59 ml/min   BPH (benign prostatic hyperplasia)   Coronary artery disease involving native heart without angina pectoris   Right ureteral calculus   Acute congestive heart failure ([place])   84 y.o.malewith medical history significant of congestive heart failure, BPH, recent hospitalization from 4/13-4/15-subsequently was discharged to his assisted living facility. He was brought back to the emergency room for evaluation of worsening shortness of breath.he developed worsening shortness of breath, he was brought to the emergency room, where he was thought to have congestive heart failure and I was subsequently asked to admit this patient for further evaluation and treatment. Patient was recently admitted and discharged on 4/15-during this admission CT of abdomen and pelvis showed GB sludge, focal thickening of the rectosigmoid colon concerning for possible neoplasm underwent colonoscopy, as well as a nonobstructive right distal ureteral calculus. GI and urology were consulted to assist with management. .s/p right ureteroscopic laser lithotripsy and stone removal and ureteral stent placement  4/14.  Assessment/Plan:  Acute hypoxic respiratory failure. Resolved with low flow oxygen. Thought due to to acute on chronic diastolic CHF. Diuresing per cardiology.    Acute on chronic diastolic CHF. On diuresis per cardiology, volume status continues to improve, but respiratory status continues to be frail, 2-D echo with grade 2 diastolic CHF, continue with torsemide  Leukocytosis. Cough..chest x-ray does not show any overt pneumonia, UA without UTI. Received empiric vancomycin and meropenem. Persistent cough. Obtain ct chest  without contrast  -Patient is status post Rightureteral stent placement Dr. Alinda Money. Recently discharged home on Augmentin 4 days after finishing a course of levofloxacin  Parox Atrial fibrillation: Management per cardiology  Dysphagia - severe Risk of aspiration, SLP input appreciated, on dysphagia 3 with nectar thick  Hypertension: - Very soft blood pressure, continue to hold Coreg especially in the setting of diuresis Appears controlled-holding carvedilol-continue Lasix.  History of right ureteral calculus - underwent ureterocystoscopy and stone removal on 4/14.  BPH: - Continue with Flomax and finasteride   Abdominal pain. Obtain KUB  Prognosis is guarded in elderly patient with ongoing progressive medical issues, at risk for infection. Palliative care following for possible hospice care arrangement   Code Status: DNR Family Communication: d/w patient, his family (indicate person spoken with, relationship, and if by phone, the number) Disposition Plan: TBD   Consultants:  Cardiology   Procedures:  none  Antibiotics: Anti-infectives    Start     Dose/Rate Route Frequency Ordered Stop   07/01/16 1600  ceFEPIme (MAXIPIME) 2 g in dextrose 5 % 50 mL IVPB  Status:  Discontinued     2 g 100 mL/hr over 30 Minutes Intravenous Every 24 hours 07/10/2016 1631 07/03/2016 1846   07/01/16 0400  vancomycin (VANCOCIN) IVPB 750 mg/150 ml premix  Status:  Discontinued     750 mg 150 mL/hr over 60 Minutes Intravenous Every 12 hours 06/18/2016 1631 07/11/2016 1846   07/08/2016 1615  ceFEPIme (MAXIPIME) 2 g in dextrose 5 % 50 mL IVPB     2 g 100 mL/hr over 30 Minutes Intravenous  Once 07/04/2016 1610 06/13/2016 1748   07/01/2016 1615  vancomycin (VANCOCIN) IVPB 1000 mg/200 mL premix     1,000 mg 200 mL/hr over 60 Minutes Intravenous  Once 07/06/2016 1610 06/14/2016 1848  HPI/Subjective: Alert, coughing. Sleepy   Objective: Vitals:   07/03/16 0708 07/03/16 0722  BP:  (!) 106/46   Pulse: 94 (!) 101  Resp: 17 18  Temp:  97.8 F (36.6 C)    Intake/Output Summary (Last 24 hours) at 07/03/16 1130 Last data filed at 07/03/16 0800  Gross per 24 hour  Intake                0 ml  Output             4900 ml  Net            -4900 ml   Filed Weights   07/01/16 0500 07/02/16 0531 07/03/16 0600  Weight: 86.9 kg (191 lb 8 oz) 85.3 kg (188 lb 1.6 oz) 84.4 kg (186 lb)    Exam:   General:  No distress   Cardiovascular: s1,s2 rrr  Respiratory: diminished in LL  Abdomen: soft, mild tender  Musculoskeletal: mild edema    Data Reviewed: Basic Metabolic Panel:  Recent Labs Lab 06/27/16 0616 06/28/16 2250 06/27/2016 1422 07/01/16 0248 07/01/16 1824 07/02/16 0410  NA 137 139 137 136  --  139  K 4.4 3.9 4.3 3.9  --  3.5  CL 110 112* 109 106  --  102  CO2 19* 21* 21* 24  --  26  GLUCOSE 126* 122* 140* 161*  --  126*  BUN 16 15 12 17   --  31*  CREATININE 1.23 1.28* 1.17 1.34*  --  1.57*  CALCIUM 7.7* 8.3* 8.4* 8.2*  --  8.6*  MG 1.9  --   --   --  2.0  --    Liver Function Tests:  Recent Labs Lab 07/04/2016 1422 07/02/16 0410  AST 27 27  ALT 21 23  ALKPHOS 83 90  BILITOT 1.0 0.5  PROT 5.9* 6.2*  ALBUMIN 2.6* 2.4*   No results for input(s): LIPASE, AMYLASE in the last 168 hours. No results for input(s): AMMONIA in the last 168 hours. CBC:  Recent Labs Lab 06/28/16 2250 06/15/2016 1528 07/01/16 0248 07/02/16 0410  WBC 9.4 19.2* 14.4* 14.6*  NEUTROABS 5.9 16.9*  --   --   HGB 12.4* 12.6* 11.4* 11.1*  HCT 37.2* 37.5* 35.0* 33.9*  MCV 86.5 85.8 86.2 86.5  PLT 172 187 176 208   Cardiac Enzymes:  Recent Labs Lab 07/01/16 2000  TROPONINI 0.24*   BNP (last 3 results)  Recent Labs  07/11/2016 1526 07/01/16 1824  BNP 399.6* 316.2*    ProBNP (last 3 results) No results for input(s): PROBNP in the last 8760 hours.  CBG: No results for input(s): GLUCAP in the last 168 hours.  Recent Results (from the past 240 hour(s))  Urine culture      Status: Abnormal   Collection Time: 06/25/16  1:04 PM  Result Value Ref Range Status   Specimen Description URINE, RANDOM  Final   Special Requests NONE  Final   Culture MULTIPLE SPECIES PRESENT, SUGGEST RECOLLECTION (A)  Final   Report Status 06/26/2016 FINAL  Final  Urine culture     Status: None   Collection Time: 06/28/16 11:06 PM  Result Value Ref Range Status   Specimen Description URINE, CATHETERIZED  Final   Special Requests NONE  Final   Culture   Final    NO GROWTH Performed at [facility], [address] [place], [place] [address]    Report Status 06/16/2016 FINAL  Final  Blood Culture (routine  x 2)     Status: None (Preliminary result)   Collection Time: 07/03/2016  4:50 PM  Result Value Ref Range Status   Specimen Description BLOOD RIGHT ANTECUBITAL  Final   Special Requests   Final    BOTTLES DRAWN AEROBIC AND ANAEROBIC Blood Culture adequate volume   Culture NO GROWTH 3 DAYS  Final   Report Status PENDING  Incomplete  Blood Culture (routine x 2)     Status: None (Preliminary result)   Collection Time: 07/04/2016  4:55 PM  Result Value Ref Range Status   Specimen Description BLOOD RIGHT HAND  Final   Special Requests   Final    BOTTLES DRAWN AEROBIC AND ANAEROBIC Blood Culture adequate volume   Culture NO GROWTH 3 DAYS  Final   Report Status PENDING  Incomplete  MRSA PCR Screening     Status: Abnormal   Collection Time: 07/08/2016  6:52 PM  Result Value Ref Range Status   MRSA by PCR POSITIVE (A) NEGATIVE Final    Comment:        The GeneXpert MRSA Assay (FDA approved for NASAL specimens only), is one component of a comprehensive MRSA colonization surveillance program. It is not intended to diagnose MRSA infection nor to guide or monitor treatment for MRSA infections. RESULT CALLED TO, READ BACK BY AND VERIFIED WITH: M.HINES,RN AT 0142 BY L.PITT 07/01/16      Studies: Dg Chest Port 1 View  Result Date: 07/01/2016 CLINICAL DATA:  Shortness of  breath. EXAM: PORTABLE CHEST 1 VIEW COMPARISON:  06/22/2016 FINDINGS: The heart is mildly enlarged but stable. The mediastinal and hilar contours are within normal limits and unchanged. Mild tortuosity of the thoracic aorta. Chronic bronchitic type interstitial lung changes but no acute overlying pulmonary findings. Stable surgical changes. The bony thorax is intact. IMPRESSION: No acute cardiopulmonary findings. Stable mild cardiac enlargement and chronic lung changes. Electronically Signed   By: Marijo Sanes M.D.   On: 07/01/2016 12:02   Dg Swallowing Func-speech Pathology  Result Date: 07/02/2016 Objective Swallowing Evaluation: Type of Study: MBS-Modified Barium Swallow Study Patient Details Name: Eduardo Salazar MRN: [id number] Date of Birth: January 05, 1926 Today's Date: 07/02/2016 Time: SLP Start Time (ACUTE ONLY): 1047-SLP Stop Time (ACUTE ONLY): 1112 SLP Time Calculation (min) (ACUTE ONLY): 25 min Past Medical History: Past Medical History: Diagnosis Date . BPH (benign prostatic hyperplasia)  . CKD (chronic kidney disease) stage 3, GFR 30-59 ml/min  . Hx of radiation therapy 01/15/13- 02/13/13  left temple 5000 cGy 20 sessions . Hypertension  . Myocardial infarction [facility]) 2014  mild"no symptoms" was told 2 months ago . Pyloric ulcer  . skin ca   top of head; xrt comp Past Surgical History: Past Surgical History: Procedure Laterality Date . APPENDECTOMY   . CATARACT EXTRACTION, BILATERAL   . COLONOSCOPY WITH PROPOFOL N/A 06/26/2016  Procedure: COLONOSCOPY WITH PROPOFOL;  Surgeon: Juanita Craver, MD;  Location: [facility];  Service: Endoscopy;  Laterality: N/A; . CYSTOSCOPY W/ URETERAL STENT PLACEMENT  02/2002  on left for ureteral stone . CYSTOSCOPY WITH RETROGRADE PYELOGRAM, URETEROSCOPY AND STENT PLACEMENT Left 07/31/2012  Procedure: CYSTOSCOPY WITH LEFT RETROGRADE PYELOGRAM, LEFT URETEROSCOPY;  Surgeon: Claybon Jabs, MD;  Location: [facility];  Service: Urology;  Laterality: Left; .  CYSTOSCOPY/RETROGRADE/URETEROSCOPY/STONE EXTRACTION WITH BASKET Right 06/26/2016  Procedure: CYSTOSCOPY/RIGHT RETROGRADE/RIGHT URETEROSCOPY/LASER LITHOTRIPSY WITH STONE EXTRACTION WITH BASKET/RIGHT URETERAL STENT PLACEMENT;  Surgeon: Raynelle Bring, MD;  Location: [facility];  Service: Urology;  Laterality: Right; . ESOPHAGOGASTRODUODENOSCOPY N/A 09/14/2012  Procedure:  ESOPHAGOGASTRODUODENOSCOPY (EGD);  Surgeon: Jerene Bears, MD;  Location: [facility];  Service: Gastroenterology;  Laterality: N/A; . ESOPHAGOGASTRODUODENOSCOPY N/A 11/14/2012  Procedure: ESOPHAGOGASTRODUODENOSCOPY (EGD);  Surgeon: Jerene Bears, MD;  Location: [facility];  Service: Gastroenterology;  Laterality: N/A; . HOLMIUM LASER APPLICATION Right 1/51/7616  Procedure: HOLMIUM LASER APPLICATION;  Surgeon: Raynelle Bring, MD;  Location: [facility];  Service: Urology;  Laterality: Right; . MOHS SURGERY Left 11/28/12  forehead . SKIN BIOPSY Left 10/27/12  left forehead near frontal scalp . VASECTOMY   . WRIST SURGERY    "nerve repair" HPI: KENDLE ERKER a 83 y.o.malewith medical history significant of congestive heart failure, BPH, recent hospitalization from 4/13-4/15-subsequently was discharged to his assisted living facility. He was brought back to the emergency room for evaluation of worsening shortness of breath.  Pt has been observed to cough with thin liquids and pills. Denies difficulty swallowing PTA.  No Data Recorded Assessment / Plan / Recommendation CHL IP CLINICAL IMPRESSIONS 07/02/2016 Clinical Impression Pt demonstrates a moderate to severe oropharyngeal dysphagia with late swallow initiation and diffuse oropharyngeal weakness. Pt silently aspirates thin liquids before the swallow. Cued cough ejects majority of aspirate, but elicits patient discomfort as cough is prolonged and congested. Pharyngeal constriction and persitalsis are weak, hyoid excursion minimal resulting in poor epiglottic deflection with moderate vallecular and pyriform  residuals. With thins, pyriform residuals are aspirated post swallow as well. Nectar thick liquids reduce immediate aspiration, though residue is poorly mobilized with cued second swallows. This improves minimally with a chin tuck. Discussed with pts daughter who was present and initiated therapy interventions post MBS, see next note. For now, pt to continue current diet of dys 3/nectar thick liquids with a chin tuck and two swallows. Expect ongoing aspiration events and difficulty with pt consistency with strategies given fatigue and weakness. Expectation of diet tolerance or improvement with therapy guarded.  SLP Visit Diagnosis Dysphagia, oropharyngeal phase (R13.12) Attention and concentration deficit following -- Frontal lobe and executive function deficit following -- Impact on safety and function Severe aspiration risk;Risk for inadequate nutrition/hydration   CHL IP TREATMENT RECOMMENDATION 07/02/2016 Treatment Recommendations Therapy as outlined in treatment plan below   No flowsheet data found. CHL IP DIET RECOMMENDATION 07/02/2016 SLP Diet Recommendations Dysphagia 3 (Mech soft) solids;Nectar thick liquid Liquid Administration via Cup;Straw Medication Administration Crushed with puree Compensations Slow rate;Small sips/bites;Effortful swallow;Chin tuck;Use straw to facilitate chin tuck Postural Changes Seated upright at 90 degrees;Remain semi-upright after after feeds/meals (Comment)   CHL IP OTHER RECOMMENDATIONS 07/02/2016 Recommended Consults -- Oral Care Recommendations Oral care QID Other Recommendations Order thickener from pharmacy   CHL IP FOLLOW UP RECOMMENDATIONS 07/02/2016 Follow up Recommendations Inpatient Rehab   CHL IP FREQUENCY AND DURATION 07/02/2016 Speech Therapy Frequency (ACUTE ONLY) min 2x/week Treatment Duration 2 weeks      CHL IP ORAL PHASE 07/02/2016 Oral Phase WFL Oral - Pudding Teaspoon -- Oral - Pudding Cup -- Oral - Honey Teaspoon -- Oral - Honey Cup -- Oral - Nectar Teaspoon --  Oral - Nectar Cup -- Oral - Nectar Straw -- Oral - Thin Teaspoon -- Oral - Thin Cup -- Oral - Thin Straw -- Oral - Puree -- Oral - Mech Soft -- Oral - Regular -- Oral - Multi-Consistency -- Oral - Pill -- Oral Phase - Comment --  CHL IP PHARYNGEAL PHASE 07/02/2016 Pharyngeal Phase Impaired Pharyngeal- Pudding Teaspoon -- Pharyngeal -- Pharyngeal- Pudding Cup -- Pharyngeal -- Pharyngeal- Honey Teaspoon -- Pharyngeal -- Pharyngeal- Honey Cup --  Pharyngeal -- Pharyngeal- Nectar Teaspoon -- Pharyngeal -- Pharyngeal- Nectar Cup Delayed swallow initiation-pyriform sinuses;Reduced pharyngeal peristalsis;Reduced epiglottic inversion;Reduced anterior laryngeal mobility;Reduced tongue base retraction;Pharyngeal residue - valleculae;Pharyngeal residue - pyriform;Compensatory strategies attempted (with notebox);Penetration/Apiration after swallow Pharyngeal Material enters airway, remains ABOVE vocal cords and not ejected out;Material does not enter airway Pharyngeal- Nectar Straw Delayed swallow initiation-pyriform sinuses;Reduced pharyngeal peristalsis;Reduced epiglottic inversion;Reduced anterior laryngeal mobility;Reduced tongue base retraction;Pharyngeal residue - valleculae;Pharyngeal residue - pyriform;Compensatory strategies attempted (with notebox);Penetration/Apiration after swallow Pharyngeal Material does not enter airway Pharyngeal- Thin Teaspoon -- Pharyngeal -- Pharyngeal- Thin Cup Delayed swallow initiation-pyriform sinuses;Reduced pharyngeal peristalsis;Reduced epiglottic inversion;Reduced anterior laryngeal mobility;Reduced tongue base retraction;Pharyngeal residue - valleculae;Pharyngeal residue - pyriform;Compensatory strategies attempted (with notebox);Moderate aspiration;Penetration/Apiration after swallow;Penetration/Aspiration before swallow Pharyngeal Material enters airway, passes BELOW cords without attempt by patient to eject out (silent aspiration);Material enters airway, remains ABOVE vocal cords  and not ejected out Pharyngeal- Thin Straw -- Pharyngeal -- Pharyngeal- Puree Delayed swallow initiation-vallecula;Reduced epiglottic inversion;Reduced pharyngeal peristalsis;Reduced anterior laryngeal mobility;Reduced tongue base retraction;Pharyngeal residue - valleculae;Pharyngeal residue - pyriform;Compensatory strategies attempted (with notebox) Pharyngeal -- Pharyngeal- Mechanical Soft Delayed swallow initiation-vallecula;Reduced epiglottic inversion;Reduced pharyngeal peristalsis;Reduced anterior laryngeal mobility;Reduced tongue base retraction;Pharyngeal residue - valleculae;Pharyngeal residue - pyriform;Compensatory strategies attempted (with notebox) Pharyngeal -- Pharyngeal- Regular -- Pharyngeal -- Pharyngeal- Multi-consistency -- Pharyngeal -- Pharyngeal- Pill Delayed swallow initiation-vallecula;Reduced epiglottic inversion;Reduced pharyngeal peristalsis;Reduced anterior laryngeal mobility;Reduced tongue base retraction;Pharyngeal residue - valleculae;Pharyngeal residue - pyriform;Compensatory strategies attempted (with notebox) Pharyngeal -- Pharyngeal Comment --  No flowsheet data found. No flowsheet data found. Herbie [place], [place] CCC-SLP [phone] Lynann Beaver 07/02/2016, 12:08 PM               Scheduled Meds: . Chlorhexidine Gluconate Cloth  6 each Topical Q0600  . enoxaparin (LOVENOX) injection  40 mg Subcutaneous Q24H  . finasteride  5 mg Oral Daily  . guaiFENesin  1,200 mg Oral BID  . ipratropium-albuterol  3 mL Nebulization TID  . mouth rinse  15 mL Mouth Rinse BID  . mupirocin ointment  1 application Nasal BID  . sodium chloride flush  3 mL Intravenous Q12H  . tamsulosin  0.4 mg Oral BID  . torsemide  40 mg Oral BID   Continuous Infusions: . diltiazem (CARDIZEM) infusion Stopped (07/01/16 1000)    Principal Problem:   Acute diastolic CHF (congestive heart failure) ([facility]) Active Problems:   CKD (chronic kidney disease) stage 3, GFR 30-59 ml/min   BPH (benign  prostatic hyperplasia)   Coronary artery disease involving native heart without angina pectoris   Right ureteral calculus   Acute congestive heart failure [facility])   Palliative care by specialist   Dysphagia    Time spent: >35 minutes     Kinnie Feil  [facility] Pager [phone]. If 7PM-7AM, please contact night-coverage at [email or web address], password [facility] 07/03/2016, 11:30 AM  LOS: 3 days

## 2016-07-03 NOTE — Progress Notes (Signed)
Patient progressed to NSR, lying in bed comfortable. Refuses to turn. Daughters at bedside. Will continue to monitor closely.

## 2016-07-03 NOTE — Consult Note (Signed)
Consultation Note Date: 07/03/2016   Patient Name: Eduardo Salazar  DOB: 06-13-1925  MRN: [id number]  Age / Sex: 83 y.o., male  PCP: Sande Brothers, MD Referring Physician: Kinnie Feil, MD  Reason for Consultation: Disposition, Establishing goals of care, Hospice Evaluation and Psychosocial/spiritual support  HPI/Patient Profile: 83 y.o. male  with past medical history of Chronic diastolic heart failure, hypertension, non-ST elevation myocardial infarction in 2014, peptic ulcer disease, BPH, chronic kidney disease stage III admitted on 06/27/2016 with worsening shortness of breath, edema and atrial fibrillation. Patient now in acute on chronic diastolic heart failure with another suspected non-ST elevation MI, mitral regurgitation, paroxysmal atrial fibrillation. Patient's diuretic was changed from Lasix to [facility] and he has had improved diuresis. He has shown some worsening in kidney function, creatinine on 07/02/2016 1.57, up from 1.34. He has had nonsustained runs of V. tach..   Clinical Assessment and Goals of Care: Met with daughters Izora Gala and Suanne Marker for goals of care discussion and review of palliative services versus hospice services. Patient currently lives in an assisted living facility with his wife. He is the caregiver of his wife. She unfortunately has dementia. Patient's physicians have provided and documented goals of care discussion and education to family on patient's fragile, critical condition. They see their father within  hours to days of death. Patient is now diuresing, but they see his overall condition is very weak and unable to return to his baseline.  Reviewed differences between palliative medicine and hospice care. Introduced concept of inpatient hospice if patient were to take a turn for the worse.  NEXT OF KIN patient has 3 daughters who are the primary decision makers specifically  Izora Gala and [place]. Patient is married but she has dementia    SUMMARY OF RECOMMENDATIONS   Continue to try to treat the treatable Remain in stepdown unit Family aware of patient's guarded, critical condition DO NOT RESUSCITATE DO NOT INTUBATE At this point family does not see that patient will be in a position to return to assisted living with either palliative or hospice support and I concur especially with his spouse having a moderate degree of dementia. He likely would needs skilled nursing facility or if he continues to decline rapidly would qualify for inpatient hospice Code Status/Advance Care Planning:  DNR    Symptom Management:   Dyspnea: Continue with diuresis with Demadex, targeted pulmonary treatments with oxygen nebulizer treatments etc. Opioids, should this become more profound; would recommend morphine IV 1-4 mg every 2 hours as needed. Did discuss the role of opioids with family in managing acute shortness of breath as well as the usage of continuous infusions  Pain: Patient denies pain however he describes his left lower quadrant as sore. Continue with oxycodone 5 mg as needed every 4 hours  Secretions: No current secretions but patient may be at high risk to develop these secondary to volume overload: Would recommend Robinul IV 0.2 mg to 0.4 mg every 4 hours as needed but would likely need scheduled dosing should he  develop upper airway secretions  Palliative Prophylaxis:   Aspiration, Bowel Regimen, Delirium Protocol, Eye Care, Frequent Pain Assessment, Oral Care and Turn Reposition  Additional Recommendations (Limitations, Scope, Preferences):  No Artificial Feeding, No Chemotherapy, No Hemodialysis, No Radiation, No Surgical Procedures and No Tracheostomy  Psycho-social/Spiritual:   Desire for further Chaplaincy support:no  Additional Recommendations: Grief/Bereavement Support  Prognosis:   < 4 weeks in the setting of acute on chronic diastolic heart  failure, chronic kidney disease stage III, non-STEMI 2014 and now with this hospitalization; very high aspiration risk: Patient is aspirating on all consistencies and it is just matter of time before he develops aspiration pneumonia again  Discharge Planning: To Be Determined      Primary Diagnoses: Present on Admission: . Acute diastolic CHF (congestive heart failure) ([place]) . Coronary artery disease involving native heart without angina pectoris . CKD (chronic kidney disease) stage 3, GFR 30-59 ml/min . BPH (benign prostatic hyperplasia) . Right ureteral calculus   I have reviewed the medical record, interviewed the patient and family, and examined the patient. The following aspects are pertinent.  Past Medical History:  Diagnosis Date  . BPH (benign prostatic hyperplasia)   . CKD (chronic kidney disease) stage 3, GFR 30-59 ml/min   . Hx of radiation therapy 01/15/13- 02/13/13   left temple 5000 cGy 20 sessions  . Hypertension   . Myocardial infarction [facility]) 2014   mild"no symptoms" was told 2 months ago  . Pyloric ulcer   . skin ca    top of head; xrt comp   Social History   Social History  . Marital status: Married    Spouse name: N/A  . Number of children: 7  . Years of education: N/A   Occupational History  . retired    Social History Main Topics  . Smoking status: Former Smoker    Packs/day: 0.50    Years: 22.00    Types: Cigarettes    Quit date: 07/27/1973  . Smokeless tobacco: Never Used  . Alcohol use No  . Drug use: No  . Sexual activity: Not Currently   Other Topics Concern  . None   Social History Narrative   Assisted living.  [facility], walks with a cane.     Family History  Problem Relation Age of Onset  . Heart attack Father   . Arthritis Mother   . Colon cancer Neg Hx    Scheduled Meds: . Chlorhexidine Gluconate Cloth  6 each Topical Q0600  . enoxaparin (LOVENOX) injection  40 mg Subcutaneous Q24H  . finasteride  5 mg Oral Daily  .  guaiFENesin  1,200 mg Oral BID  . ipratropium-albuterol  3 mL Nebulization TID  . mouth rinse  15 mL Mouth Rinse BID  . mupirocin ointment  1 application Nasal BID  . sodium chloride flush  3 mL Intravenous Q12H  . tamsulosin  0.4 mg Oral BID  . torsemide  40 mg Oral BID   Continuous Infusions: . diltiazem (CARDIZEM) infusion Stopped (07/01/16 1000)   PRN Meds:.acetaminophen **OR** acetaminophen, albuterol, ondansetron **OR** ondansetron (ZOFRAN) IV, oxyCODONE, RESOURCE THICKENUP CLEAR Medications Prior to Admission:  Prior to Admission medications   Medication Sig Start Date End Date Taking? Authorizing Provider  acetaminophen (TYLENOL) 500 MG tablet Take 1,000 mg by mouth every 8 (eight) hours as needed (for mild pain, fever, or headache).    Yes Historical Provider, MD  alum & mag hydroxide-simeth (Misenheimer) 200-200-20 MG/5ML suspension Take 30 mLs by mouth See admin  instructions. AS NEEDED FOR HEARTBURN OR INDIGESTION/NOT TO EXCEED 4 DOSES IN 24 HOURS   Yes Historical Provider, MD  amoxicillin-clavulanate (AUGMENTIN) 875-125 MG tablet Take 1 tablet by mouth every 12 (twelve) hours. Patient taking differently: Take 1 tablet by mouth every 12 (twelve) hours. For 4 days 06/27/16  Yes Orson Eva, MD  carvedilol (COREG) 3.125 MG tablet Take 3.125 mg by mouth daily.    Yes Historical Provider, MD  cephALEXin (KEFLEX) 500 MG capsule Take 1 capsule (500 mg total) by mouth 2 (two) times daily. 06/29/16  Yes Doristine Devoid, PA-C  docusate sodium (COLACE) 100 MG capsule Take 100 mg by mouth daily.   Yes Historical Provider, MD  famotidine (PEPCID) 20 MG tablet Take 20 mg by mouth at bedtime.   Yes Historical Provider, MD  finasteride (PROSCAR) 5 MG tablet Take 5 mg by mouth daily.    Yes Historical Provider, MD  guaifenesin (ROBAFEN) 100 MG/5ML syrup Take 200 mg by mouth every 6 (six) hours as needed for cough. NOT TO EXCEED 4 DOSES IN 24 HOURS   Yes Historical Provider, MD  ipratropium-albuterol  (DUONEB) 0.5-2.5 (3) MG/3ML SOLN Take 3 mLs by nebulization 2 (two) times daily. FOR 10 DAYS   Yes Historical Provider, MD  loperamide (IMODIUM) 2 MG capsule Take 2 mg by mouth See admin instructions. WITH EACH LOOSE STOOL AS NEEDED FOR DIARRHEA/NOT TO EXCEED 8 DOES IN 24 HOURS   Yes Historical Provider, MD  magnesium hydroxide (MILK OF MAGNESIA) 400 MG/5ML suspension Take 30 mLs by mouth at bedtime as needed (for constipation).    Yes Historical Provider, MD  Neomycin-Bacitracin-Polymyxin (TRIPLE ANTIBIOTIC) [phone] OINT See admin instructions. FOR SKIN TEARS OR ABRASIONS: CLEAN AREA WITH NORMAL SALINE, APPLY OINTMENT, COVER WITH BANDAID OR GAUZE AND TAPE. CHANGE AS NEEDED UNTIL HEALED.   Yes Historical Provider, MD  nitroGLYCERIN (NITROSTAT) 0.4 MG SL tablet Place 0.4 mg under the tongue every 5 (five) minutes as needed for chest pain. CALL DOCTOR IS NO RELIEF   Yes Historical Provider, MD  polyvinyl alcohol (ARTIFICIAL TEARS) 1.4 % ophthalmic solution Place 1 drop into both eyes 4 (four) times daily.   Yes Historical Provider, MD  tamsulosin (FLOMAX) 0.4 MG CAPS Take 0.4 mg by mouth 2 (two) times daily.    Yes Historical Provider, MD  Vitamin D, Ergocalciferol, (DRISDOL) 50000 UNITS CAPS capsule Take 50,000 Units by mouth every 7 (seven) days.    Yes Historical Provider, MD   No Known Allergies Review of Systems  Unable to perform ROS: Mental status change    Physical Exam  Constitutional:  Acutely ill appearing elderly man  HENT:  Head: Normocephalic and atraumatic.  Neck: Normal range of motion.  Cardiovascular:  Leg edema, heart rate 90s  Pulmonary/Chest:  Mild increased work of breathing noted at rest with conversation  Abdominal: He exhibits distension.  Musculoskeletal: He exhibits edema.  Can move all extremities 4 but very weak  Neurological:  Keeps his eyes closed through conversation but awakens, conversant  Skin: Skin is warm and dry. There is pallor.  Psychiatric:  His behavior is normal. Judgment and thought content normal.  Nursing note and vitals reviewed.   Vital Signs: BP (!) 106/46 (BP Location: Left Arm)   Pulse (!) 101   Temp 97.8 F (36.6 C) (Oral)   Resp 18   Ht '5\' 7"'$  (1.702 m)   Wt 84.4 kg (186 lb)   SpO2 93%   BMI 29.13 kg/m  Pain Assessment: Faces POSS *  See Group Information*: 1-Acceptable,Awake and alert Pain Score: 0-No pain   SpO2: SpO2: 93 % O2 Device:SpO2: 93 % O2 Flow Rate: .O2 Flow Rate (L/min): 2 L/min  IO: Intake/output summary:  Intake/Output Summary (Last 24 hours) at 07/03/16 1053 Last data filed at 07/03/16 0800  Gross per 24 hour  Intake                0 ml  Output             4900 ml  Net            -4900 ml    LBM: Last BM Date: 07/01/16 Baseline Weight: Weight: 85.7 kg (189 lb) Most recent weight: Weight: 84.4 kg (186 lb)     Palliative Assessment/Data:   Flowsheet Rows     Most Recent Value  Intake Tab  Referral Department  Cardiology  Unit at Time of Referral  Intermediate Care Unit  Palliative Care Primary Diagnosis  Cardiac  Date Notified  07/02/16  Palliative Care Type  New Palliative care  Reason for referral  Clarify Goals of Care, Counsel Regarding Hospice, Psychosocial or Spiritual support  Date of Admission  06/27/2016  Date first seen by Palliative Care  07/03/16  # of days Palliative referral response time  1 Day(s)  # of days IP prior to Palliative referral  2  Clinical Assessment  Palliative Performance Scale Score  30%  Pain Max last 24 hours  Not able to report  Pain Min Last 24 hours  Not able to report  Dyspnea Max Last 24 Hours  Not able to report  Dyspnea Min Last 24 hours  Not able to report  Nausea Max Last 24 Hours  Not able to report  Nausea Min Last 24 Hours  Not able to report  Anxiety Max Last 24 Hours  Not able to report  Anxiety Min Last 24 Hours  Not able to report  Other Max Last 24 Hours  Not able to report  Psychosocial & Spiritual Assessment  Palliative  Care Outcomes  Patient/Family meeting held?  Yes  Who was at the meeting?  met with daughters Suanne Marker and [place] regarding hospice, Provided psychosocial or spiritual support, Clarified goals of care  Patient/Family wishes: Interventions discontinued/not started   Mechanical Ventilation, PEG, Trach, Tube feedings/TPN  Palliative Care follow-up planned  Yes, Facility      Time In: 0900 Time Out: 1020 Time Total: 80 min Greater than 50%  of this time was spent counseling and coordinating care related to the above assessment and plan. Staffed with Dr. Einar Gip  Signed by: Dory Horn, NP   Please contact Palliative Medicine Team phone at [phone] for questions and concerns.  For individual provider: See Shea Evans

## 2016-07-03 NOTE — Progress Notes (Signed)
Patient went into Afib/flutter at [place]. HR sustaining 90's. B/P 101/51. Patient resting in bed stating he was comfortable and he did not want to turn over. Daughters at bedside. Will continue to monitor closely.

## 2016-07-03 NOTE — Progress Notes (Signed)
Patient in horrible pain for KUB; pre-medicated w/Oxycodone IR per PRN orders in anticipation of pain.  Patient is sensitive to any repositioning or even touch.  Daughter, Suanne Marker, at bedside.  Patient requesting "no more" and says he does not want to go for CT scan.  Process of CT scan explained to daughter - transferring patient from bed to CT table, laying flat for scan, transferring back to bed - Suanne Marker and Izora Gala conferred via telephone and are in agreement w/patient that they do not want the CT scan - daughter Suanne Marker states "we just want him to be comfortable and enjoy the time we have left with him".  Dr. Daleen Bo notified of patient/family request.  Order for CT chest cancelled per Dr. Daleen Bo.

## 2016-07-04 MED ORDER — GLYCOPYRROLATE 0.2 MG/ML IJ SOLN
0.4000 mg | Freq: Four times a day (QID) | INTRAMUSCULAR | Status: DC
  Administered 2016-07-04 (×3): 0.4 mg via INTRAVENOUS
  Filled 2016-07-04 (×2): qty 2

## 2016-07-04 MED ORDER — SODIUM CHLORIDE 0.9 % IV SOLN
1.0000 mg/h | INTRAVENOUS | Status: DC
  Administered 2016-07-04: 1 mg/h via INTRAVENOUS
  Filled 2016-07-04: qty 10

## 2016-07-04 MED ORDER — MORPHINE SULFATE (PF) 2 MG/ML IV SOLN
1.0000 mg | INTRAVENOUS | Status: DC | PRN
  Administered 2016-07-04: 3 mg via INTRAVENOUS
  Administered 2016-07-04: 2 mg via INTRAVENOUS
  Filled 2016-07-04: qty 1
  Filled 2016-07-04 (×2): qty 2

## 2016-07-04 MED ORDER — LORAZEPAM 2 MG/ML IJ SOLN
0.5000 mg | INTRAMUSCULAR | Status: DC | PRN
  Administered 2016-07-04: 1 mg via INTRAVENOUS
  Filled 2016-07-04: qty 1

## 2016-07-04 MED ORDER — GLYCOPYRROLATE 0.2 MG/ML IJ SOLN
0.4000 mg | INTRAMUSCULAR | Status: DC | PRN
  Filled 2016-07-04: qty 2

## 2016-07-04 MED ORDER — MORPHINE BOLUS VIA INFUSION
2.0000 mg | INTRAVENOUS | Status: DC | PRN
  Administered 2016-07-04 (×3): 2 mg via INTRAVENOUS
  Filled 2016-07-04: qty 2

## 2016-07-04 NOTE — Progress Notes (Signed)
Daily Progress Note   Patient Name: Eduardo Salazar       Date: 07/04/2016 DOB: 02-05-1926  Age: 83 y.o. MRN#: [id number] Attending Physician: Kinnie Feil, MD Primary Care Physician: Sande Brothers, MD Admit Date: 06/18/2016  Reason for Consultation/Follow-up: Establishing goals of care, Non pain symptom management, Pain control, Psychosocial/spiritual support and Terminal Care  Subjective: Patient has had a sharp decline overnight. He developed severe pain and began crying out for help. He has been minimally responsive except to awaken in pain or distress. He is no longer able to take oral medications. His daughters remain at his bedside  Length of Stay: 4  Current Medications: Scheduled Meds:  . glycopyrrolate  0.4 mg Intravenous QID  . mouth rinse  15 mL Mouth Rinse BID  . mupirocin ointment  1 application Nasal BID  . sodium chloride flush  3 mL Intravenous Q12H    Continuous Infusions: . morphine 1 mg/hr (07/04/16 1439)    PRN Meds: acetaminophen **OR** acetaminophen, glycopyrrolate, LORazepam, morphine, ondansetron **OR** ondansetron (ZOFRAN) IV  Physical Exam  Constitutional:  Acutely ill appearing elderly man. He appears to be transitioning towards dying  Pulmonary/Chest:  Increased work of breathing Respirations are shallow  Genitourinary:  Genitourinary Comments: Foley  Neurological:  Minimally responsive. No longer to take oral medications  Skin:  Cool, pale  Psychiatric:  Unable to test  Nursing note and vitals reviewed.           Vital Signs: BP (!) 106/46 (BP Location: Left Arm)   Pulse 72   Temp 97.8 F (36.6 C) (Oral)   Resp 17   Ht '5\' 7"'$  (1.702 m)   Wt 84.4 kg (186 lb)   SpO2 92%   BMI 29.13 kg/m  SpO2: SpO2: 92 % O2 Device: O2 Device: Nasal  Cannula O2 Flow Rate: O2 Flow Rate (L/min): 3 L/min  Intake/output summary:  Intake/Output Summary (Last 24 hours) at 07/04/16 1650 Last data filed at 07/04/16 0500  Gross per 24 hour  Intake                0 ml  Output              950 ml  Net             -950 ml   LBM: Last  BM Date: 07/01/16 Baseline Weight: Weight: 85.7 kg (189 lb) Most recent weight: Weight:  (wt not taken this morning per families request)       Palliative Assessment/Data:    Flowsheet Rows     Most Recent Value  Intake Tab  Referral Department  Cardiology  Unit at Time of Referral  Intermediate Care Unit  Palliative Care Primary Diagnosis  Cardiac  Date Notified  07/02/16  Palliative Care Type  New Palliative care  Reason for referral  Clarify Goals of Care, Counsel Regarding Hospice, Psychosocial or Spiritual support  Date of Admission  06/23/2016  Date first seen by Palliative Care  07/03/16  # of days Palliative referral response time  1 Day(s)  # of days IP prior to Palliative referral  2  Clinical Assessment  Palliative Performance Scale Score  30%  Pain Max last 24 hours  Not able to report  Pain Min Last 24 hours  Not able to report  Dyspnea Max Last 24 Hours  Not able to report  Dyspnea Min Last 24 hours  Not able to report  Nausea Max Last 24 Hours  Not able to report  Nausea Min Last 24 Hours  Not able to report  Anxiety Max Last 24 Hours  Not able to report  Anxiety Min Last 24 Hours  Not able to report  Other Max Last 24 Hours  Not able to report  Psychosocial & Spiritual Assessment  Palliative Care Outcomes  Patient/Family meeting held?  Yes  Who was at the meeting?  met with daughters Suanne Marker and [place] regarding hospice, Provided psychosocial or spiritual support, Clarified goals of care  Patient/Family wishes: Interventions discontinued/not started   Mechanical Ventilation, PEG, Trach, Tube feedings/TPN  Palliative Care follow-up planned  Yes,  Facility      Patient Active Problem List   Diagnosis Date Noted  . Palliative care by specialist   . Dysphagia   . Acute congestive heart failure ([place])   . Acute diastolic CHF (congestive heart failure) ([place]) 06/21/2016  . Acute renal failure superimposed on stage 3 chronic kidney disease ([place]) 06/26/2016  . Ureteral calculus, right 06/26/2016  . Dehydration 06/26/2016  . Pressure injury of skin 06/26/2016  . Right ureteral calculus 06/26/2016  . Weakness   . AKI (acute kidney injury) ([place]) 06/25/2016  . Bronchitis 06/25/2016  . Abdominal pain 06/25/2016  . UTI (lower urinary tract infection) 03/05/2015  . Coronary artery disease involving native heart without angina pectoris 03/05/2015  . Benign essential HTN 03/05/2015  . Leukocytosis 03/05/2015  . T12 compression fracture ([place]) 05/26/2014  . BPH (benign prostatic hyperplasia) 05/26/2014  . Squamous cell carcinoma of skin of other and unspecified parts of face 10/27/2012  . CKD (chronic kidney disease) stage 3, GFR 30-59 ml/min 09/13/2012  . Pyloric channel ulcer 09/12/2012    Palliative Care Assessment & Plan   Patient Profile: 83 y.o. male  with past medical history of Chronic diastolic heart failure, hypertension, non-ST elevation myocardial infarction in 2014, peptic ulcer disease, BPH, chronic kidney disease stage III admitted on 07/07/2016 with worsening shortness of breath, edema and atrial fibrillation. Patient now in acute on chronic diastolic heart failure with another suspected non-ST elevation MI, mitral regurgitation, paroxysmal atrial fibrillation. Patient's diuretic was changed from Lasix to [facility] and he has had improved diuresis. He has shown some worsening in kidney function, creatinine on 07/02/2016 1.57, up from 1.34. He has had nonsustained runs of V. tach.Marland Kitchen  Assessment: Met with family and assessed patient today. There is a dramatic decline from yesterday when he was talking to me and making jokes with  his family and friends. He is now minimally responsive and has developed severe pain overnight. Family has refused further workup for CT scan and wishes to pursue comfort care  Recommendations/Plan:  Pain: Patient has received several as needed doses of morphine for shortness of breath and/or pain. After discussing symptom management with family we elected to start a morphine continuous infusion at 1 mg an hour and 2 mg every 2 hours as needed for comfort/pain/shortness of breath  Secretions: He has copious upper airway secretions. We'll start scheduled Robinul as well as as needed Robinul  Anxiety: Patient has been anxious mildly agitated when he's experienced pain, we'll add as needed Ativan. Monitor for need for scheduled dosing  Goals of Care and Additional Recommendations:  Limitations on Scope of Treatment: Avoid Hospitalization, Minimize Medications, Initiate Comfort Feeding, No Artificial Feeding, No Blood Transfusions, No Chemotherapy, No Diagnostics, No Hemodialysis, No IV Antibiotics, No Lab Draws, No Radiation, No Surgical Procedures and No Tracheostomy  Code Status:    Code Status Orders        Start     Ordered   07/01/16 1850  Do not attempt resuscitation (DNR)  Continuous    Question Answer Comment  In the event of cardiac or respiratory ARREST Do not call a "code blue"   In the event of cardiac or respiratory ARREST Do not perform Intubation, CPR, defibrillation or ACLS   In the event of cardiac or respiratory ARREST Use medication by any route, position, wound care, and other measures to relive pain and suffering. May use oxygen, suction and manual treatment of airway obstruction as needed for comfort.   Comments D/W Daughter Izora Gala      07/01/16 1850    Code Status History    Date Active Date Inactive Code Status Order ID Comments User Context   06/25/2016  6:46 PM 07/01/2016  6:50 PM Full Code [id number]  Jonetta Osgood, MD Inpatient   06/25/2016  1:20 PM 06/27/2016   6:44 PM Full Code [id number]  Elmarie Shiley, MD Inpatient   03/05/2015  1:40 PM 03/07/2015  5:09 PM Full Code [id number]  [name], MD Inpatient   05/26/2014  3:54 PM 05/28/2014  6:32 PM Full Code [id number]  [name], MD Inpatient       Prognosis:   Hours - Days  Discharge Planning:  Anticipated Hospital Death  Care plan was discussed with Dr. Rowe Pavy  Thank you for allowing the Palliative Medicine Team to assist in the care of this patient.   Time In: 1500 Time Out: 1545 Total Time 45 min Prolonged Time Billed  no       Greater than 50%  of this time was spent counseling and coordinating care related to the above assessment and plan.  Dory Horn, NP  Please contact Palliative Medicine Team phone at [phone] for questions and concerns.

## 2016-07-04 NOTE — Progress Notes (Signed)
[facility] PROGRESS NOTE  Eduardo Salazar MHD:[id number] DOB: September 16, 1925 DOA: 06/22/2016 PCP: Sande Brothers, MD  Brief summary  Principal Problem:   Acute diastolic CHF (congestive heart failure) ([place]) Active Problems:   CKD (chronic kidney disease) stage 3, GFR 30-59 ml/min   BPH (benign prostatic hyperplasia)   Coronary artery disease involving native heart without angina pectoris   Right ureteral calculus   Acute congestive heart failure ([place])   83 y.o.malewith medical history significant of congestive heart failure, BPH, recent hospitalization from 4/13-4/15-subsequently was discharged to his assisted living facility. He was brought back to the emergency room for evaluation of worsening shortness of breath.he developed worsening shortness of breath, he was brought to the emergency room, where he was thought to have congestive heart failure and I was subsequently asked to admit this patient for further evaluation and treatment. Patient was recently admitted and discharged on 4/15-during this admission CT of abdomen and pelvis showed GB sludge, focal thickening of the rectosigmoid colon concerning for possible neoplasm underwent colonoscopy, as well as a nonobstructive right distal ureteral calculus. GI and urology were consulted to assist with management. .s/p right ureteroscopic laser lithotripsy and stone removal and ureteral stent placement  4/14.   Assessment/Plan:  Acute hypoxic respiratory failure. Resolved with low flow oxygen. Thought due to to acute on chronic diastolic CHF. Diuresing per cardiology.    Acute on chronic diastolic CHF. On diuresis per cardiology, volume status continues to improve, but respiratory status continues to be frail, 2-D echo with grade 2 diastolic CHF, continue with torsemide  Leukocytosis. Cough..chest x-ray does not show any overt pneumonia, UA without UTI. Received empiric vancomycin and meropenem. Persistent cough. Family refused f/u ct  chest without contrast to r/o PNA.  -Patient is status post Rightureteral stent placement Dr. Alinda Money. Recently discharged home on Augmentin 4 days after finishing a course of levofloxacin  Parox Atrial fibrillation: Management per cardiology  Dysphagia - severe Risk of aspiration, SLP input appreciated, on dysphagia 3 with nectar thick  Hypertension: - Very soft blood pressure, continue to hold Coreg especially in the setting of diuresis Appears controlled-holding carvedilol-continue Lasix.  History of right ureteral calculus - underwent ureterocystoscopy and stone removal on 4/14.  BPH: - Continue with Flomax and finasteride   Abdominal pain. Ileus vs partial obstruction. Cont supportive care   Prognosis is poor in elderly patient with multiple ongoing progressive medical issues, progressive declined, failure to thrive, at risk for infection. D/w patient, family. They want comfort care. Palliative care following for possible hospice care arrangement. Need to deescalate unnecessary meds    Code Status: DNR Family Communication: d/w patient, his family (indicate person spoken with, relationship, and if by phone, the number) Disposition Plan: possible hospice. TBD   Consultants:  Cardiology   Procedures:  none  Antibiotics: Anti-infectives    Start     Dose/Rate Route Frequency Ordered Stop   07/01/16 1600  ceFEPIme (MAXIPIME) 2 g in dextrose 5 % 50 mL IVPB  Status:  Discontinued     2 g 100 mL/hr over 30 Minutes Intravenous Every 24 hours 07/01/2016 1631 06/27/2016 1846   07/01/16 0400  vancomycin (VANCOCIN) IVPB 750 mg/150 ml premix  Status:  Discontinued     750 mg 150 mL/hr over 60 Minutes Intravenous Every 12 hours 06/22/2016 1631 07/06/2016 1846   06/26/2016 1615  ceFEPIme (MAXIPIME) 2 g in dextrose 5 % 50 mL IVPB     2 g 100 mL/hr over 30 Minutes Intravenous  Once 06/19/2016  1610 06/14/2016 1748   06/18/2016 1615  vancomycin (VANCOCIN) IVPB 1000 mg/200 mL premix      1,000 mg 200 mL/hr over 60 Minutes Intravenous  Once 07/10/2016 1610 06/13/2016 1848       HPI/Subjective: comfortable. No distress  Objective: Vitals:   07/04/16 0600 07/04/16 0720  BP:    Pulse: 63 72  Resp: 17 17    Intake/Output Summary (Last 24 hours) at 07/04/16 1102 Last data filed at 07/04/16 0500  Gross per 24 hour  Intake              240 ml  Output             1850 ml  Net            -1610 ml   Filed Weights   07/01/16 0500 07/02/16 0531 07/03/16 0600  Weight: 86.9 kg (191 lb 8 oz) 85.3 kg (188 lb 1.6 oz) 84.4 kg (186 lb)    Exam:   General:  No distress   Cardiovascular: s1,s2 rrr  Respiratory: diminished in LL  Abdomen: soft, mild tender  Musculoskeletal: mild edema    Data Reviewed: Basic Metabolic Panel:  Recent Labs Lab 06/28/16 2250 06/24/2016 1422 07/01/16 0248 07/01/16 1824 07/02/16 0410  NA 139 137 136  --  139  K 3.9 4.3 3.9  --  3.5  CL 112* 109 106  --  102  CO2 21* 21* 24  --  26  GLUCOSE 122* 140* 161*  --  126*  BUN 15 12 17   --  31*  CREATININE 1.28* 1.17 1.34*  --  1.57*  CALCIUM 8.3* 8.4* 8.2*  --  8.6*  MG  --   --   --  2.0  --    Liver Function Tests:  Recent Labs Lab 07/07/2016 1422 07/02/16 0410  AST 27 27  ALT 21 23  ALKPHOS 83 90  BILITOT 1.0 0.5  PROT 5.9* 6.2*  ALBUMIN 2.6* 2.4*   No results for input(s): LIPASE, AMYLASE in the last 168 hours. No results for input(s): AMMONIA in the last 168 hours. CBC:  Recent Labs Lab 06/28/16 2250 07/09/2016 1528 07/01/16 0248 07/02/16 0410  WBC 9.4 19.2* 14.4* 14.6*  NEUTROABS 5.9 16.9*  --   --   HGB 12.4* 12.6* 11.4* 11.1*  HCT 37.2* 37.5* 35.0* 33.9*  MCV 86.5 85.8 86.2 86.5  PLT 172 187 176 208   Cardiac Enzymes:  Recent Labs Lab 07/01/16 2000  TROPONINI 0.24*   BNP (last 3 results)  Recent Labs  07/10/2016 1526 07/01/16 1824  BNP 399.6* 316.2*    ProBNP (last 3 results) No results for input(s): PROBNP in the last 8760 hours.  CBG: No  results for input(s): GLUCAP in the last 168 hours.  Recent Results (from the past 240 hour(s))  Urine culture     Status: Abnormal   Collection Time: 06/25/16  1:04 PM  Result Value Ref Range Status   Specimen Description URINE, RANDOM  Final   Special Requests NONE  Final   Culture MULTIPLE SPECIES PRESENT, SUGGEST RECOLLECTION (A)  Final   Report Status 06/26/2016 FINAL  Final  Urine culture     Status: None   Collection Time: 06/28/16 11:06 PM  Result Value Ref Range Status   Specimen Description URINE, CATHETERIZED  Final   Special Requests NONE  Final   Culture   Final    NO GROWTH Performed at [facility], [address]  [place], [place] [address]    Report Status 07/01/2016 FINAL  Final  Blood Culture (routine x 2)     Status: None (Preliminary result)   Collection Time: 06/15/2016  4:50 PM  Result Value Ref Range Status   Specimen Description BLOOD RIGHT ANTECUBITAL  Final   Special Requests   Final    BOTTLES DRAWN AEROBIC AND ANAEROBIC Blood Culture adequate volume   Culture NO GROWTH 3 DAYS  Final   Report Status PENDING  Incomplete  Blood Culture (routine x 2)     Status: None (Preliminary result)   Collection Time: 06/28/2016  4:55 PM  Result Value Ref Range Status   Specimen Description BLOOD RIGHT HAND  Final   Special Requests   Final    BOTTLES DRAWN AEROBIC AND ANAEROBIC Blood Culture adequate volume   Culture NO GROWTH 3 DAYS  Final   Report Status PENDING  Incomplete  MRSA PCR Screening     Status: Abnormal   Collection Time: 07/06/2016  6:52 PM  Result Value Ref Range Status   MRSA by PCR POSITIVE (A) NEGATIVE Final    Comment:        The GeneXpert MRSA Assay (FDA approved for NASAL specimens only), is one component of a comprehensive MRSA colonization surveillance program. It is not intended to diagnose MRSA infection nor to guide or monitor treatment for MRSA infections. RESULT CALLED TO, READ BACK BY AND VERIFIED WITH: M.HINES,RN AT 0142  BY L.PITT 07/01/16      Studies: Dg Abd 1 View  Result Date: 07/03/2016 CLINICAL DATA:  Patient with left-sided abdominal pain and firmness to touch. No bowel movement for 1 week. Prior barium swallow. EXAM: ABDOMEN - 1 VIEW COMPARISON:  CT abdomen pelvis 06/25/2016 FINDINGS: Oral contrast material is demonstrated within the small bowel distally as well as colon. Gas is demonstrated within mildly prominent loops of small bowel within the central abdomen and right lower quadrant. Lumbar spine degenerative changes. IMPRESSION: Gaseous distended loops of small bowel pain central abdomen and right lower quadrant with distal colonic gas and contrast. Findings may represent ileus or partial obstruction. Electronically Signed   By: Lovey Newcomer M.D.   On: 07/03/2016 12:25   Dg Swallowing Func-speech Pathology  Result Date: 07/02/2016 Objective Swallowing Evaluation: Type of Study: MBS-Modified Barium Swallow Study Patient Details Name: Eduardo Salazar MRN: [id number] Date of Birth: [birth year] Today's Date: 07/02/2016 Time: SLP Start Time (ACUTE ONLY): 1047-SLP Stop Time (ACUTE ONLY): 1112 SLP Time Calculation (min) (ACUTE ONLY): 25 min Past Medical History: Past Medical History: Diagnosis Date . BPH (benign prostatic hyperplasia)  . CKD (chronic kidney disease) stage 3, GFR 30-59 ml/min  . Hx of radiation therapy 01/15/13- 02/13/13  left temple 5000 cGy 20 sessions . Hypertension  . Myocardial infarction [facility]) 2014  mild"no symptoms" was told 2 months ago . Pyloric ulcer  . skin ca   top of head; xrt comp Past Surgical History: Past Surgical History: Procedure Laterality Date . APPENDECTOMY   . CATARACT EXTRACTION, BILATERAL   . COLONOSCOPY WITH PROPOFOL N/A 06/26/2016  Procedure: COLONOSCOPY WITH PROPOFOL;  Surgeon: Juanita Craver, MD;  Location: [facility];  Service: Endoscopy;  Laterality: N/A; . CYSTOSCOPY W/ URETERAL STENT PLACEMENT  02/2002  on left for ureteral stone . CYSTOSCOPY WITH RETROGRADE PYELOGRAM,  URETEROSCOPY AND STENT PLACEMENT Left 07/31/2012  Procedure: CYSTOSCOPY WITH LEFT RETROGRADE PYELOGRAM, LEFT URETEROSCOPY;  Surgeon: Claybon Jabs, MD;  Location: [facility];  Service: Urology;  Laterality: Left; . CYSTOSCOPY/RETROGRADE/URETEROSCOPY/STONE EXTRACTION  WITH BASKET Right 06/26/2016  Procedure: CYSTOSCOPY/RIGHT RETROGRADE/RIGHT URETEROSCOPY/LASER LITHOTRIPSY WITH STONE EXTRACTION WITH BASKET/RIGHT URETERAL STENT PLACEMENT;  Surgeon: Raynelle Bring, MD;  Location: [facility];  Service: Urology;  Laterality: Right; . ESOPHAGOGASTRODUODENOSCOPY N/A 09/14/2012  Procedure: ESOPHAGOGASTRODUODENOSCOPY (EGD);  Surgeon: Jerene Bears, MD;  Location: [facility];  Service: Gastroenterology;  Laterality: N/A; . ESOPHAGOGASTRODUODENOSCOPY N/A 11/14/2012  Procedure: ESOPHAGOGASTRODUODENOSCOPY (EGD);  Surgeon: Jerene Bears, MD;  Location: [facility];  Service: Gastroenterology;  Laterality: N/A; . HOLMIUM LASER APPLICATION Right 4/49/6759  Procedure: HOLMIUM LASER APPLICATION;  Surgeon: Raynelle Bring, MD;  Location: [facility];  Service: Urology;  Laterality: Right; . MOHS SURGERY Left 11/28/12  forehead . SKIN BIOPSY Left 10/27/12  left forehead near frontal scalp . VASECTOMY   . WRIST SURGERY    "nerve repair" HPI: Eduardo Salazar a 83 y.o.malewith medical history significant of congestive heart failure, BPH, recent hospitalization from 4/13-4/15-subsequently was discharged to his assisted living facility. He was brought back to the emergency room for evaluation of worsening shortness of breath.  Pt has been observed to cough with thin liquids and pills. Denies difficulty swallowing PTA.  No Data Recorded Assessment / Plan / Recommendation CHL IP CLINICAL IMPRESSIONS 07/02/2016 Clinical Impression Pt demonstrates a moderate to severe oropharyngeal dysphagia with late swallow initiation and diffuse oropharyngeal weakness. Pt silently aspirates thin liquids before the swallow. Cued cough ejects majority of aspirate, but elicits  patient discomfort as cough is prolonged and congested. Pharyngeal constriction and persitalsis are weak, hyoid excursion minimal resulting in poor epiglottic deflection with moderate vallecular and pyriform residuals. With thins, pyriform residuals are aspirated post swallow as well. Nectar thick liquids reduce immediate aspiration, though residue is poorly mobilized with cued second swallows. This improves minimally with a chin tuck. Discussed with pts daughter who was present and initiated therapy interventions post MBS, see next note. For now, pt to continue current diet of dys 3/nectar thick liquids with a chin tuck and two swallows. Expect ongoing aspiration events and difficulty with pt consistency with strategies given fatigue and weakness. Expectation of diet tolerance or improvement with therapy guarded.  SLP Visit Diagnosis Dysphagia, oropharyngeal phase (R13.12) Attention and concentration deficit following -- Frontal lobe and executive function deficit following -- Impact on safety and function Severe aspiration risk;Risk for inadequate nutrition/hydration   CHL IP TREATMENT RECOMMENDATION 07/02/2016 Treatment Recommendations Therapy as outlined in treatment plan below   No flowsheet data found. CHL IP DIET RECOMMENDATION 07/02/2016 SLP Diet Recommendations Dysphagia 3 (Mech soft) solids;Nectar thick liquid Liquid Administration via Cup;Straw Medication Administration Crushed with puree Compensations Slow rate;Small sips/bites;Effortful swallow;Chin tuck;Use straw to facilitate chin tuck Postural Changes Seated upright at 90 degrees;Remain semi-upright after after feeds/meals (Comment)   CHL IP OTHER RECOMMENDATIONS 07/02/2016 Recommended Consults -- Oral Care Recommendations Oral care QID Other Recommendations Order thickener from pharmacy   CHL IP FOLLOW UP RECOMMENDATIONS 07/02/2016 Follow up Recommendations Inpatient Rehab   CHL IP FREQUENCY AND DURATION 07/02/2016 Speech Therapy Frequency (ACUTE ONLY)  min 2x/week Treatment Duration 2 weeks      CHL IP ORAL PHASE 07/02/2016 Oral Phase WFL Oral - Pudding Teaspoon -- Oral - Pudding Cup -- Oral - Honey Teaspoon -- Oral - Honey Cup -- Oral - Nectar Teaspoon -- Oral - Nectar Cup -- Oral - Nectar Straw -- Oral - Thin Teaspoon -- Oral - Thin Cup -- Oral - Thin Straw -- Oral - Puree -- Oral - Mech Soft -- Oral - Regular -- Oral - Multi-Consistency -- Oral -  Pill -- Oral Phase - Comment --  CHL IP PHARYNGEAL PHASE 07/02/2016 Pharyngeal Phase Impaired Pharyngeal- Pudding Teaspoon -- Pharyngeal -- Pharyngeal- Pudding Cup -- Pharyngeal -- Pharyngeal- Honey Teaspoon -- Pharyngeal -- Pharyngeal- Honey Cup -- Pharyngeal -- Pharyngeal- Nectar Teaspoon -- Pharyngeal -- Pharyngeal- Nectar Cup Delayed swallow initiation-pyriform sinuses;Reduced pharyngeal peristalsis;Reduced epiglottic inversion;Reduced anterior laryngeal mobility;Reduced tongue base retraction;Pharyngeal residue - valleculae;Pharyngeal residue - pyriform;Compensatory strategies attempted (with notebox);Penetration/Apiration after swallow Pharyngeal Material enters airway, remains ABOVE vocal cords and not ejected out;Material does not enter airway Pharyngeal- Nectar Straw Delayed swallow initiation-pyriform sinuses;Reduced pharyngeal peristalsis;Reduced epiglottic inversion;Reduced anterior laryngeal mobility;Reduced tongue base retraction;Pharyngeal residue - valleculae;Pharyngeal residue - pyriform;Compensatory strategies attempted (with notebox);Penetration/Apiration after swallow Pharyngeal Material does not enter airway Pharyngeal- Thin Teaspoon -- Pharyngeal -- Pharyngeal- Thin Cup Delayed swallow initiation-pyriform sinuses;Reduced pharyngeal peristalsis;Reduced epiglottic inversion;Reduced anterior laryngeal mobility;Reduced tongue base retraction;Pharyngeal residue - valleculae;Pharyngeal residue - pyriform;Compensatory strategies attempted (with notebox);Moderate aspiration;Penetration/Apiration after  swallow;Penetration/Aspiration before swallow Pharyngeal Material enters airway, passes BELOW cords without attempt by patient to eject out (silent aspiration);Material enters airway, remains ABOVE vocal cords and not ejected out Pharyngeal- Thin Straw -- Pharyngeal -- Pharyngeal- Puree Delayed swallow initiation-vallecula;Reduced epiglottic inversion;Reduced pharyngeal peristalsis;Reduced anterior laryngeal mobility;Reduced tongue base retraction;Pharyngeal residue - valleculae;Pharyngeal residue - pyriform;Compensatory strategies attempted (with notebox) Pharyngeal -- Pharyngeal- Mechanical Soft Delayed swallow initiation-vallecula;Reduced epiglottic inversion;Reduced pharyngeal peristalsis;Reduced anterior laryngeal mobility;Reduced tongue base retraction;Pharyngeal residue - valleculae;Pharyngeal residue - pyriform;Compensatory strategies attempted (with notebox) Pharyngeal -- Pharyngeal- Regular -- Pharyngeal -- Pharyngeal- Multi-consistency -- Pharyngeal -- Pharyngeal- Pill Delayed swallow initiation-vallecula;Reduced epiglottic inversion;Reduced pharyngeal peristalsis;Reduced anterior laryngeal mobility;Reduced tongue base retraction;Pharyngeal residue - valleculae;Pharyngeal residue - pyriform;Compensatory strategies attempted (with notebox) Pharyngeal -- Pharyngeal Comment --  No flowsheet data found. No flowsheet data found. Herbie [place], [place] CCC-SLP [phone] Lynann Beaver 07/02/2016, 12:08 PM               Scheduled Meds: . Chlorhexidine Gluconate Cloth  6 each Topical Q0600  . enoxaparin (LOVENOX) injection  40 mg Subcutaneous Q24H  . mouth rinse  15 mL Mouth Rinse BID  . mupirocin ointment  1 application Nasal BID  . sodium chloride flush  3 mL Intravenous Q12H   Continuous Infusions:   Principal Problem:   Acute diastolic CHF (congestive heart failure) ([facility]) Active Problems:   CKD (chronic kidney disease) stage 3, GFR 30-59 ml/min   BPH (benign prostatic hyperplasia)    Coronary artery disease involving native heart without angina pectoris   Right ureteral calculus   Acute congestive heart failure [facility])   Palliative care by specialist   Dysphagia    Time spent: >35 minutes     Kinnie Feil  [facility] Pager [phone]. If 7PM-7AM, please contact night-coverage at [email or web address], password [facility] 07/04/2016, 11:02 AM  LOS: 4 days

## 2016-07-04 NOTE — Progress Notes (Signed)
Subjective:  Patient is unresponsive. Family at bedside and states they are proceeding with comfort care at this time.   Objective:  Vital Signs in the last 24 hours: Pulse Rate:  [63-86] 72 (04/22 0720) Resp:  [15-20] 17 (04/22 0720) SpO2:  [91 %-95 %] 92 % (04/22 0720)  Intake/Output from previous day: 04/21 0701 - 04/22 0700 In: 240 [P.O.:240] Out: 2150 [Urine:2150]  Physical Exam:  General appearance: unresponsive, does withdraw to pain.  Neck: no carotid bruit, supple, symmetrical, trachea midline, thyroid not enlarged, symmetric, no tenderness/mass/nodules and JVD above the angle of the jaw. Lungs: Bilateral diffuse extensive crackles heard throughout anterior and posterior chest fields. Heart: S1, S2 normal, no S3 or S4 and systolic murmur: early [id number]/5, blowingat lower left sternal border, at apex Abdomen: Abdomen is mildly distended, ascites present, bowel sounds absent. Mild hepatomegaly present. Extremities: 2+ leg edema. Pulses: Carotids normal, femorals normal, popliteals could not be felt, pedal pulses could not be felt due to edema. Neurologic: Withdraws to pain.     Lab Results: BMP  Recent Labs  07/04/2016 1422 07/01/16 0248 07/02/16 0410  NA 137 136 139  K 4.3 3.9 3.5  CL 109 106 102  CO2 21* 24 26  GLUCOSE 140* 161* 126*  BUN 12 17 31*  CREATININE 1.17 1.34* 1.57*  CALCIUM 8.4* 8.2* 8.6*  GFRNONAA 54* 46* 38*  GFRAA >60 53* 44*    CBC  Recent Labs Lab 06/25/2016 1528  07/02/16 0410  WBC 19.2*  < > 14.6*  RBC 4.37  < > 3.92*  HGB 12.6*  < > 11.1*  HCT 37.5*  < > 33.9*  PLT 187  < > 208  MCV 85.8  < > 86.5  MCH 28.8  < > 28.3  MCHC 33.6  < > 32.7  RDW 15.9*  < > 15.9*  LYMPHSABS 1.7  --   --   MONOABS 0.6  --   --   EOSABS 0.0  --   --   BASOSABS 0.0  --   --   < > = values in this interval not displayed.  HEMOGLOBIN A1C No results found for: HGBA1C, MPG  Cardiac Panel (last 3 results)  Recent Labs  06/25/16 0744  07/01/16 2000  TROPONINI <0.03 0.24*    BNP (last 3 results) No results for input(s): PROBNP in the last 8760 hours.  TSH  Recent Labs  07/01/16 1056  TSH 0.770    CHOLESTEROL No results for input(s): CHOL in the last 8760 hours.  Hepatic Function Panel  Recent Labs  06/25/16 0744 06/16/2016 1422 07/02/16 0410  PROT 7.3 5.9* 6.2*  ALBUMIN 3.4* 2.6* 2.4*  AST 21 27 27   ALT 17 21 23   ALKPHOS 100 83 90  BILITOT 0.7 1.0 0.5    Imaging: Imaging results have been reviewed.  Cardiac Studies:  EKG: 07/01/2016: Sinus rhythm with sinus arrhythmia, normal axis, nonspecific T-wave abnormality, cannot exclude anterolateral ischemia.Prolonged QT.Compared to 07/12/2016, atrial fibrillation is no longer present. ST abnormalities.  Echocardiogram: Low normal LVEF at 39-76%, grade 2 diastolic dysfunction, moderate to severe posteriorly directed MR. Trace tricuspid regurgitation. Unable to estimate PA pressure.  Assessment/Plan:  1. Acute on chronic diastolic heart failure, suspect non-ST elevation MI with positive S. Troponins, mitral regurgitation may be contributing. 2. Paroxysmal atrial fibrillation 3. Chronic stage III kidney disease 4. Hypertension 5. Superficial decubitus ulcer in the lower back without infection or discharge. 6. Acute on chronic renal failure due to Cardiorenal Syndrome 7.  Leukocytosis.   Rec: Patient remains critically ill and conditions continue to deteriorate. I have reviewed Primary team note and discussed with family. Family wishes to proceed with comfort measures at this point. Oral medications have been discontinued. Will discontinue Demadex per family wishes to continue with comfort measures only. Continued comfort measures per Primary team and Palliative Care.       Miquel Dunn, FNP-C. 07/04/2016, 12:18 PM [facility] Pager: [phone] Office: [phone] If no answer: [phone]

## 2016-07-04 NOTE — Progress Notes (Signed)
Patient assessed very gently this AM; daughters at bedside and do not want patient disturbed from comfortable position unless he requests.  Patient is non-responsive unless he cries out in pain.  No longer able to take anything PO.  Family requests comfort care.  Dr. Daleen Bo in for evaluation - agrees w/comfort care measures at this time.  Will continue to monitor patient's comfort and assist as able.

## 2016-07-04 NOTE — Progress Notes (Signed)
Patient and family are requesting not to move the patient unless he requests to have position changed. Patient resting comfortably in bed with daughters at bedside. Vitals signs not taken this shift per families request. Patient medicated with 2mg  of Morphine about 0520 this morning due to moaning out "help me please".Oxycodone not given due to fear of aspiration.  Patient currently sleeping. Will continue to monitor closely.

## 2016-07-04 NOTE — Progress Notes (Signed)
Palliative NP and RN met w/family for updates and decision-making re: continued care management.  Family Izora Gala, [place], and Vaughan Basta - all three daughters) chooses to move forward w/end of life care and comfort measures only.  Orders written for continuous, low-dose Morphine IV gtt and bolus orders for break-through pain PRN in addition to Robinul for secretions and Ativan orders.  Pt will continue to be monitored overnight in the step-down unit and placement to be re-assessed tomorrow if necessary.  Family at peace w/comfort measures and acknowledge patient's prior intolerance for pain/discomfort.  Will continue to support patient and patient's family during this time.

## 2016-07-05 DIAGNOSIS — R69 Illness, unspecified: Secondary | ICD-10-CM

## 2016-07-05 LAB — CULTURE, BLOOD (ROUTINE X 2)
CULTURE: NO GROWTH
Culture: NO GROWTH
SPECIAL REQUESTS: ADEQUATE
SPECIAL REQUESTS: ADEQUATE

## 2016-07-13 NOTE — Progress Notes (Signed)
Central monitoring called and reported patient was flat line on monitor. Family at bedside and aware of death.  Patient was pronounced dead at 61 by Erlene Quan and Forest Becker, RN's. [facility] called and death reported to [facility], Reference# [id number]. Patient was declared not a donor candidate. Family refused Autopsy. [employer] was funeral home of chose and they were notified to pick up body from room. All personal belongings were given to Lear Ng, daughter and all daughters present in room requested he wear his wedding room on his finger to his grave. Hand prints were done per families request. Clergy was notified and came to room to be with family.  All lines were removed from patient and toe tag placed on patients left big toe. Hanes Linberry present to talk with family and retreive body from room.

## 2016-07-13 NOTE — Discharge Summary (Signed)
Kinnie Feil, MD Physician Signed Internal Medicine  Death Summary Note Date of Service: 2016/07/13 5:45 AM      [] Hide copied text Death Summary  Eduardo Salazar [id number] DOB: 1926/02/18 DOA: 07/08/2016  PCP: Sande Brothers, MD PCP/Office notified: epic  Admit date: 07-08-2016 Date of Death: 07-14-2016  Final Diagnoses:  Principal Problem:   Acute diastolic CHF (congestive heart failure) (HCC) Active Problems:   CKD (chronic kidney disease) stage 3, GFR 30-59 ml/min   BPH (benign prostatic hyperplasia)   Coronary artery disease involving native heart without angina pectoris   Right ureteral calculus   Acute congestive heart failure ([place])   Palliative care by specialist   Dysphagia      History of present illness:  83 y.o.malewith medical history significant of congestive heart failure, BPH, recent hospitalization from 4/13-4/15-subsequently was discharged to his assisted living facility. He was brought back to the emergency room for evaluation of worsening shortness of breath.he developed worsening shortness of breath, he was brought to the emergency room, where he was thought to have congestive heart failure and I was subsequently asked to admit this patient for further evaluation and treatment. Patient was recently admitted and discharged on 4/15-during this admission CT of abdomen and pelvis showed GB sludge, focal thickening of the rectosigmoid colon concerning for possible neoplasm underwent colonoscopy, as well as a nonobstructive right distal ureteral calculus. GI and urology were consulted to assist with management. .s/p right ureteroscopic laser lithotripsy and stone removal and ureteral stent placement 4/14  Hospital Course:  Patient was admitted with acute hypoxic respiratory failure, acute on chronic diastolic heart failure, leukocytosis, dysphagia severe risk of aspiration failure to thrive with progressive decline in overall health.   Prognosis was  very poor in elderly patient with multiple ongoing progressive medical issues, progressive declined, failure to thrive, at risk for infections, pneumonias, aspiration. Patient is seen and evaluated by palliative care. They wanted comfort care only.  Patient died on 13-Jul-2016 at 5.45 AM under comfort care family at the bedside.   Time: 5.24 AM  Signed:  Rowe Clack N            [facility] 2016-07-14, 2:38 PM

## 2016-07-13 NOTE — Progress Notes (Signed)
Wasted  unused morphine gtt 218ml in sink, verified with Ancil Linsey.

## 2016-07-13 NOTE — Death Summary Note (Signed)
Death Summary  COLESON KANT VUY:[id number] DOB: 08-30-1925 DOA: Jul 20, 2016  PCP: Sande Brothers, MD PCP/Office notified: epic  Admit date: 2016/07/20 Date of Death: Jul 26, 2016  Final Diagnoses:  Principal Problem:   Acute diastolic CHF (congestive heart failure) (HCC) Active Problems:   CKD (chronic kidney disease) stage 3, GFR 30-59 ml/min   BPH (benign prostatic hyperplasia)   Coronary artery disease involving native heart without angina pectoris   Right ureteral calculus   Acute congestive heart failure ([place])   Palliative care by specialist   Dysphagia      History of present illness:  83 y.o.malewith medical history significant of congestive heart failure, BPH, recent hospitalization from 4/13-4/15-subsequently was discharged to his assisted living facility. He was brought back to the emergency room for evaluation of worsening shortness of breath.he developed worsening shortness of breath, he was brought to the emergency room, where he was thought to have congestive heart failure and I was subsequently asked to admit this patient for further evaluation and treatment. Patient was recently admitted and discharged on 4/15-during this admission CT of abdomen and pelvis showed GB sludge, focal thickening of the rectosigmoid colon concerning for possible neoplasm underwent colonoscopy, as well as a nonobstructive right distal ureteral calculus. GI and urology were consulted to assist with management. .s/p right ureteroscopic laser lithotripsy and stone removal and ureteral stent placement 4/14  Hospital Course:  Patient was admitted with acute hypoxic respiratory failure, acute on chronic diastolic heart failure, leukocytosis, dysphagia severe risk of aspiration failure to thrive with progressive decline in overall health.   Prognosis was very poor in elderly patient with multiple ongoing progressive medical issues, progressive declined, failure to thrive, at risk for infections,  pneumonias, aspiration. Patient is seen and evaluated by palliative care. They wanted comfort care only.  Patient died on 07-25-16 at 5.45 AM under comfort care family at the bedside.   Time: 5.24 AM  Signed:  Rowe Clack N  [facility] 07-26-16, 2:38 PM

## 2016-07-13 DEATH — deceased

## 2018-10-31 IMAGING — CR DG CHEST 2V
2 series · 2 of 2 positions shown · non-contrast
Comparison: 03/02/2015

CLINICAL DATA: Cough, congestion, recent flu symptoms, weakness

EXAM:
CHEST  2 VIEW

[x chest ap]
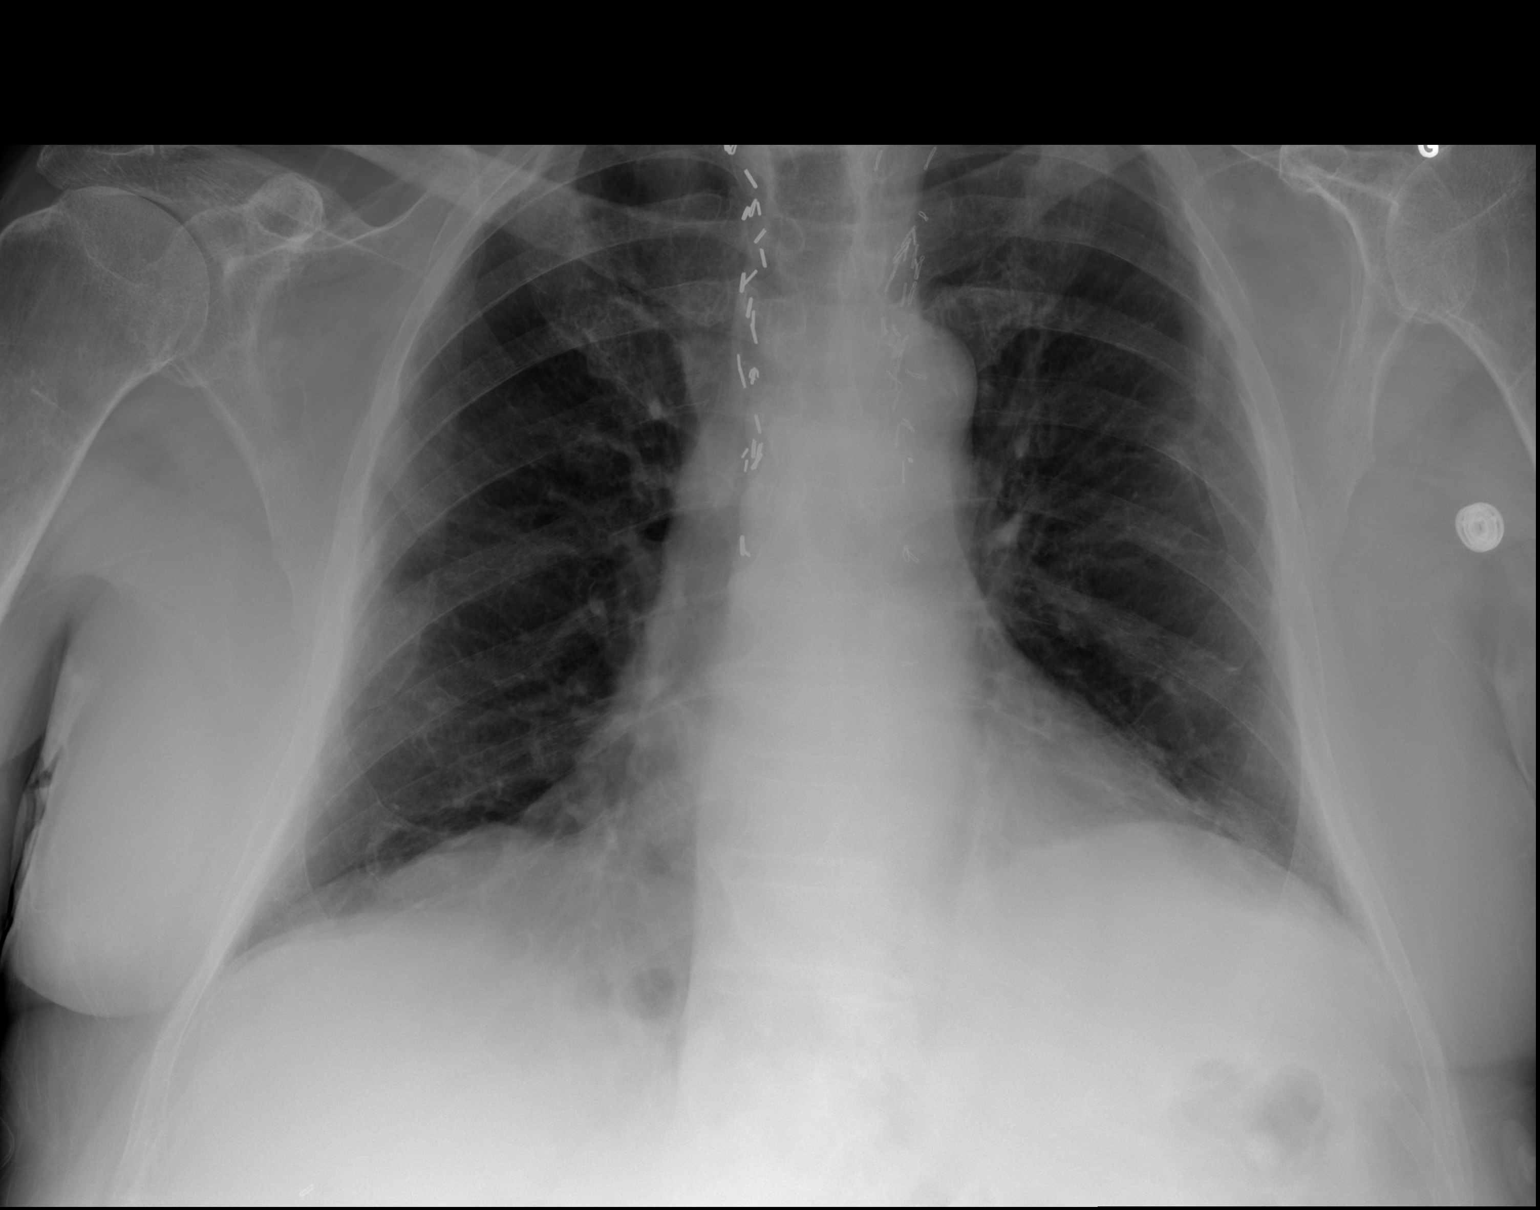

[w chest lat]
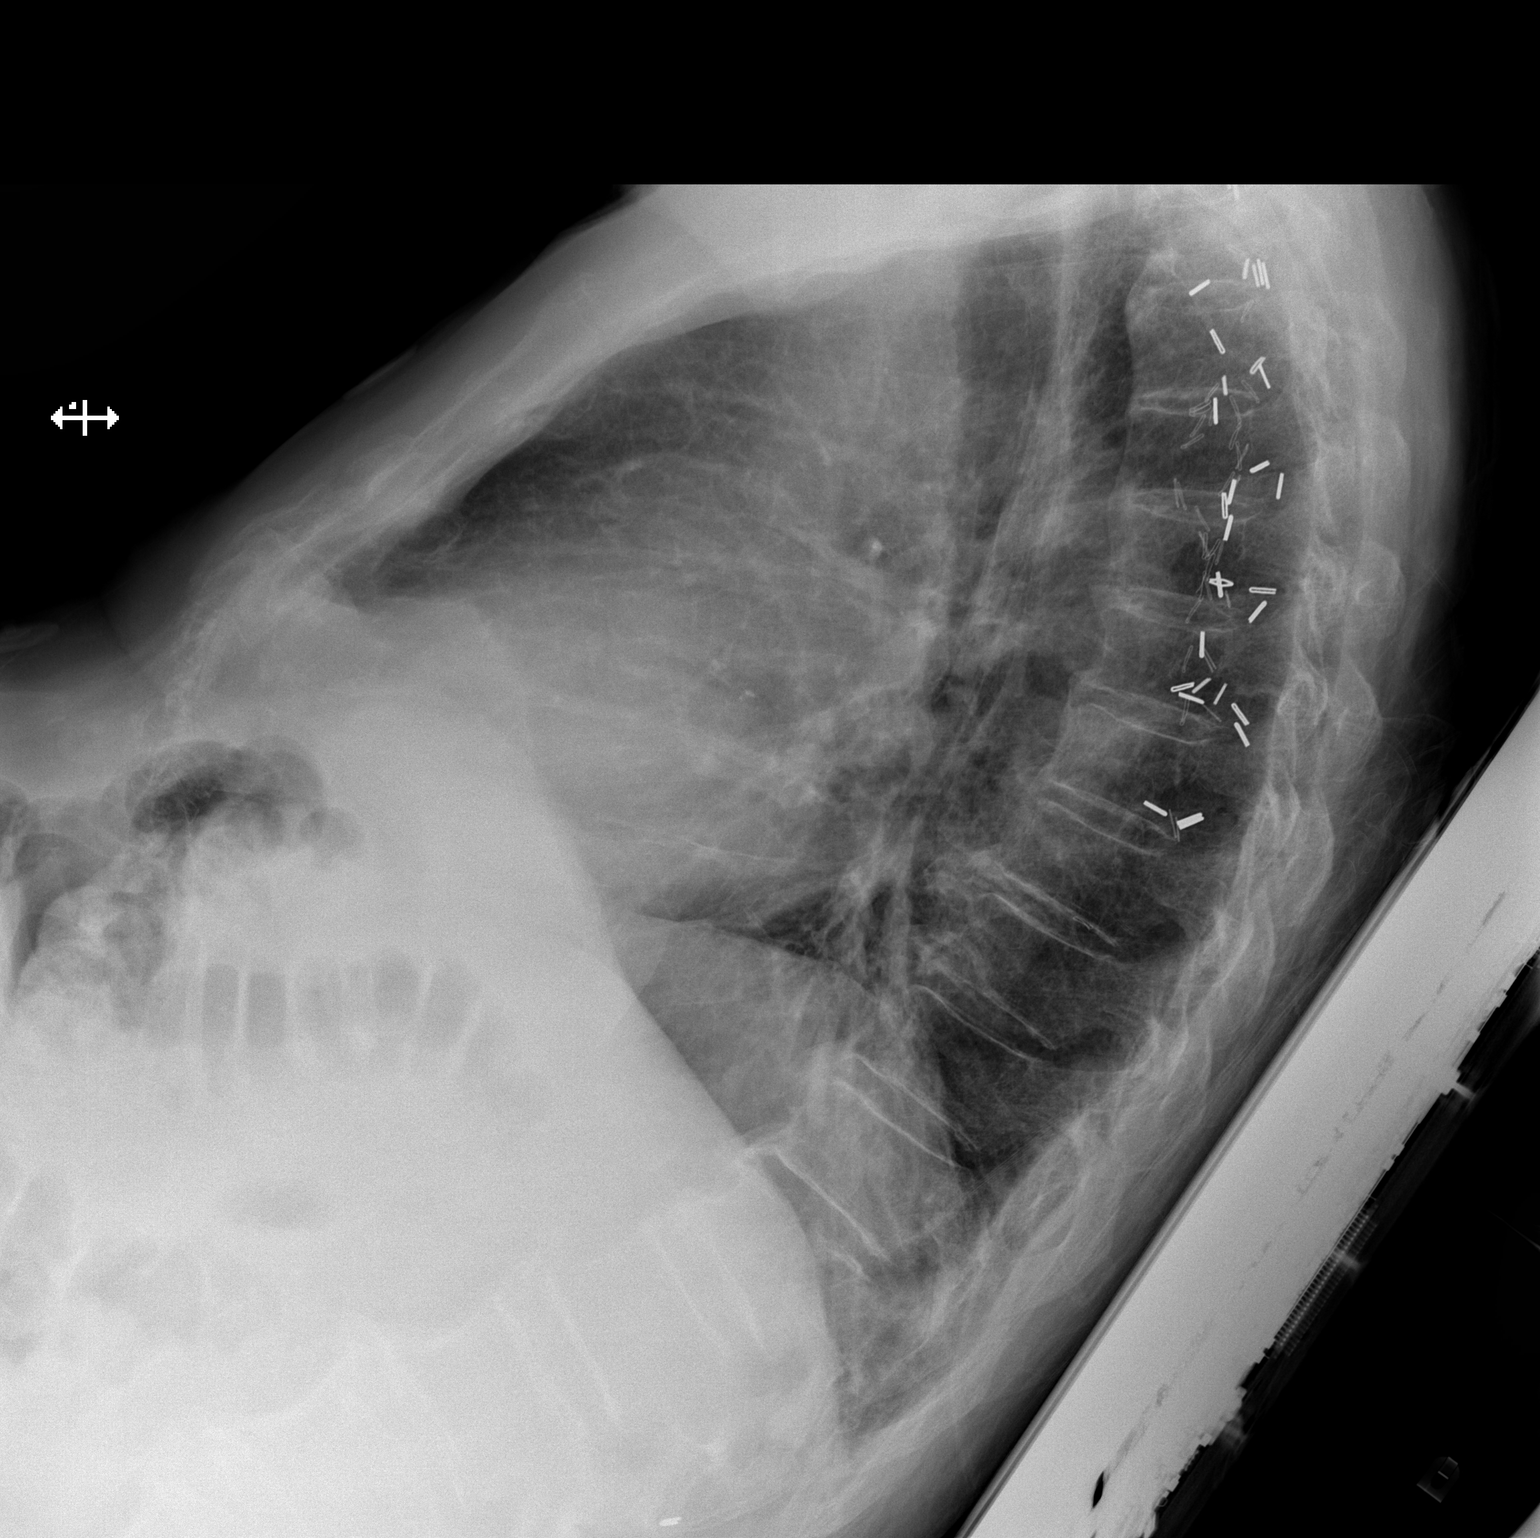

[2 of 2 positions shown; findings below may reference images not displayed]

FINDINGS: Cardiomediastinal silhouette is stable. No infiltrate or pulmonary
edema. Surgical clips upper paraspinal region are again noted. No
infiltrate or pulmonary edema. Mild left basilar atelectasis or
scarring. Degenerative changes thoracic spine.
IMPRESSION: No active cardiopulmonary disease.

## 2018-10-31 IMAGING — CT CT ABD-PELV W/ CM
2 of 5 series · 16 of 46 positions shown, 18 images · IV contrast (ISOVUE)
Comparison: Radiograph same day.  CT scan March 02, 2015.

CLINICAL DATA: Generalized abdominal pain.

EXAM:
CT ABDOMEN AND PELVIS WITH CONTRAST
TECHNIQUE: Multidetector CT imaging of the abdomen and pelvis was performed
using the standard protocol following bolus administration of
intravenous contrast.
CONTRAST:  75mL [id number] IOPAMIDOL ([id number]-[id number]) INJECTION 61%

[Series 2: abd/pel with · axial · 0.85mm/px · z∈[+1204,+1608]mm · 13 of 93 slices shown, 15 images]
[im 6/93  soft-tissue]
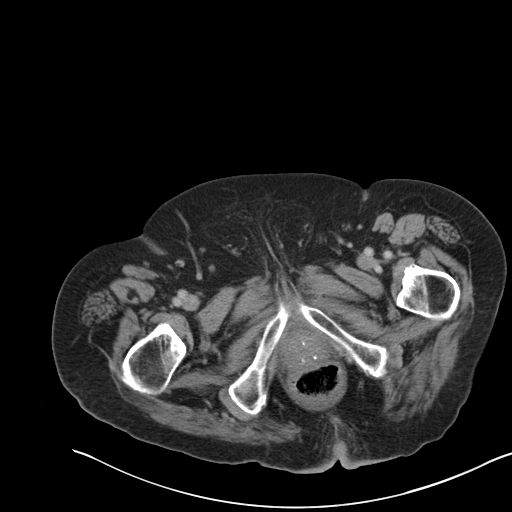
[im 6/93  bone]
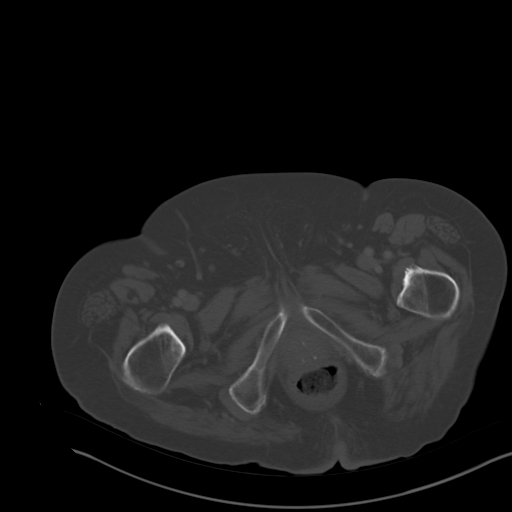
[im 11/93  soft-tissue]
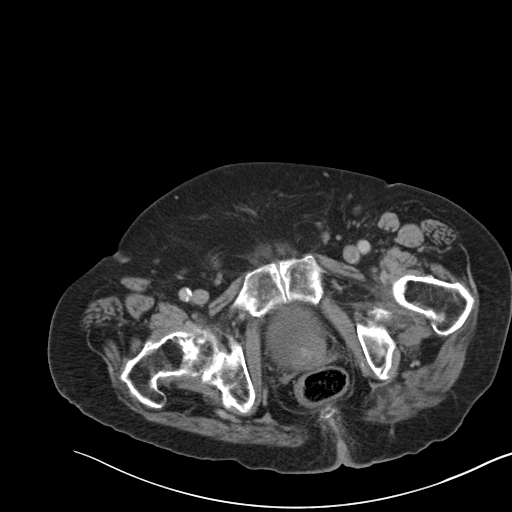
[im 22/93  soft-tissue]
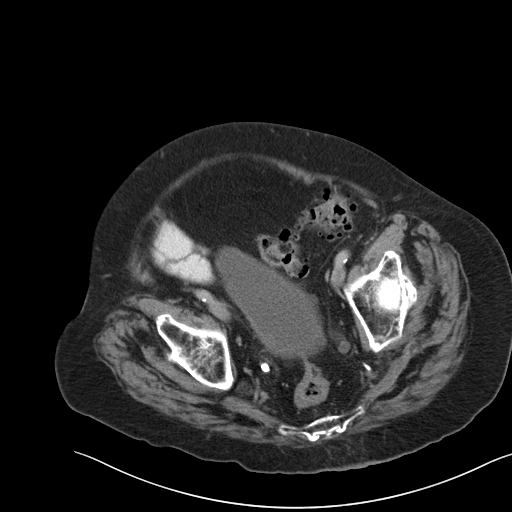
[im 28/93  soft-tissue]
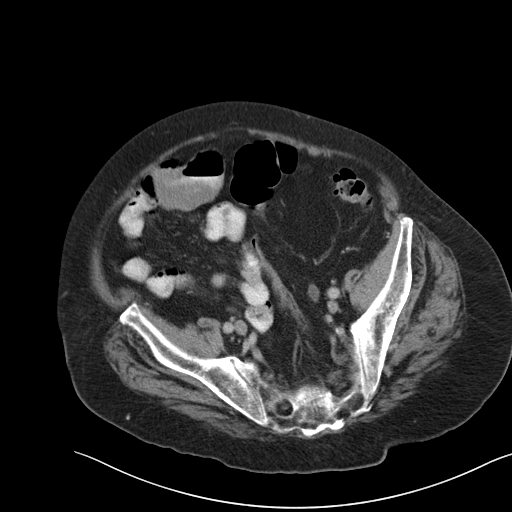
[im 33/93  soft-tissue]
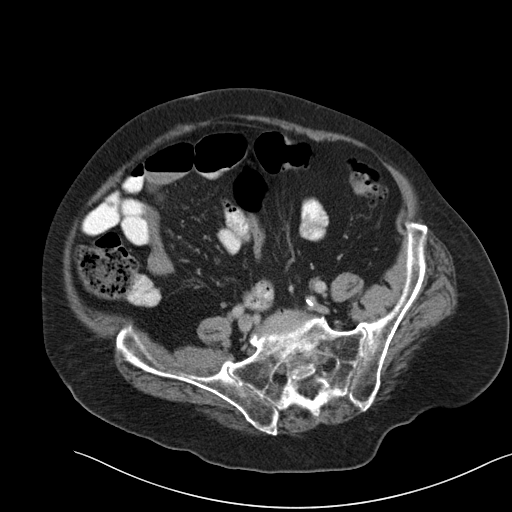
[im 38/93  soft-tissue]
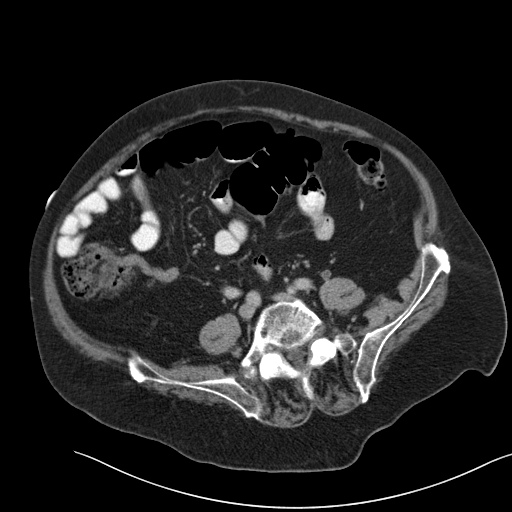
[im 49/93  soft-tissue]
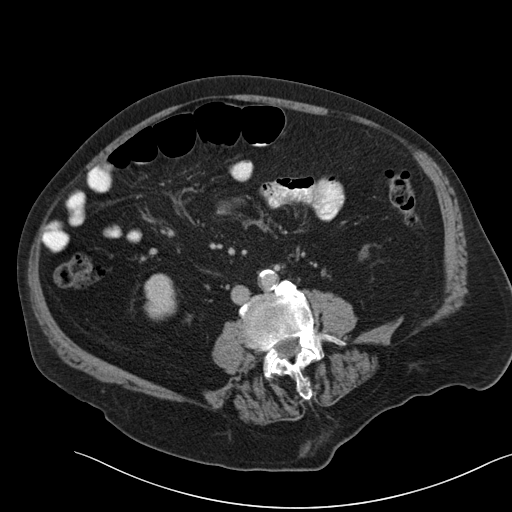
[im 55/93  soft-tissue]
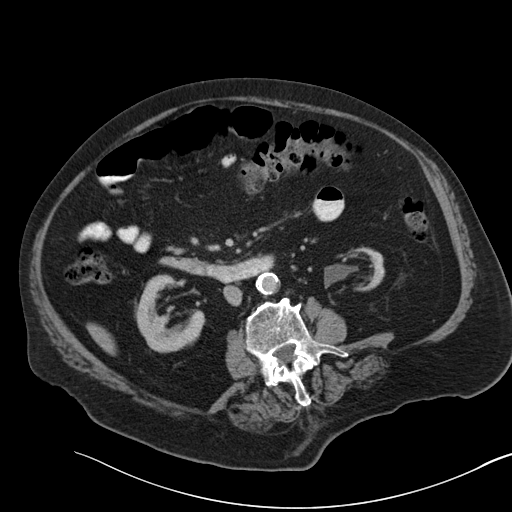
[im 60/93  soft-tissue]
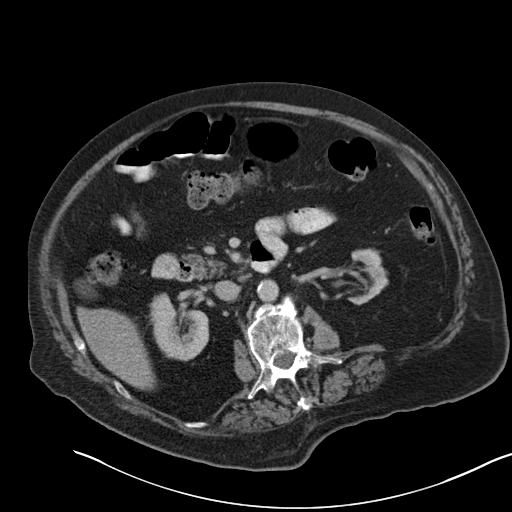
[im 60/93  bone]
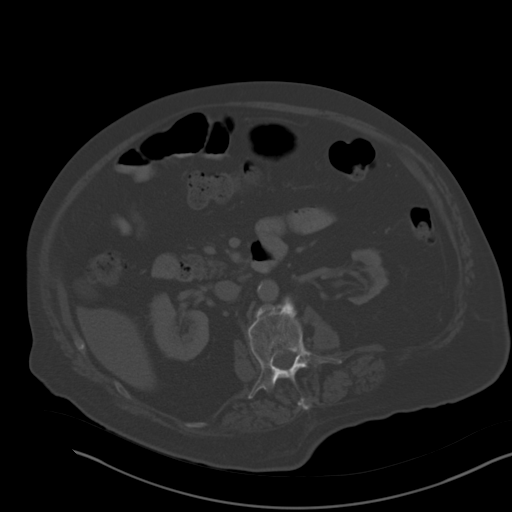
[im 65/93  soft-tissue]
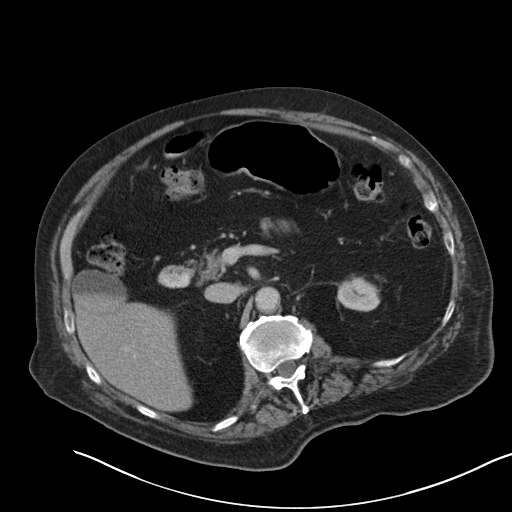
[im 71/93  soft-tissue]
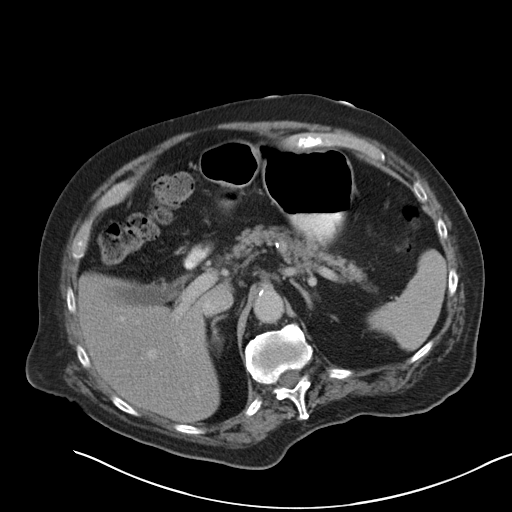
[im 82/93  soft-tissue]
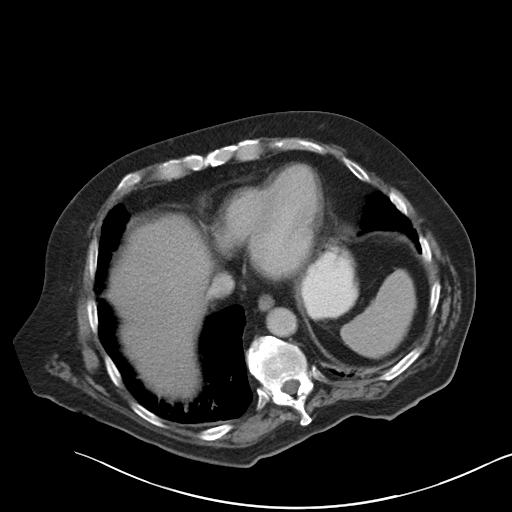
[im 87/93  soft-tissue]
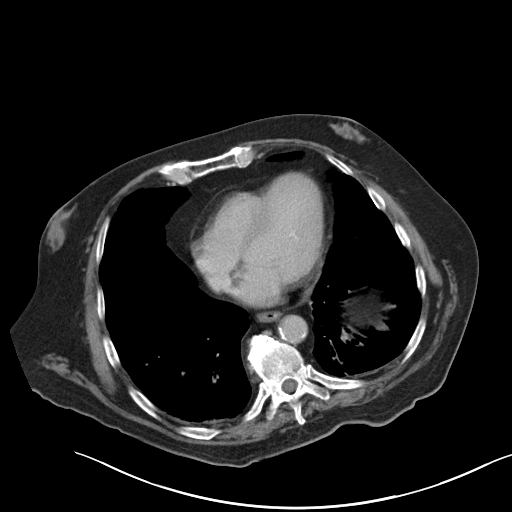

[Series 3: coronal a/|p · coronal · 0.94mm/px · 3 of 165 slices shown]
[im 55/165  soft-tissue]
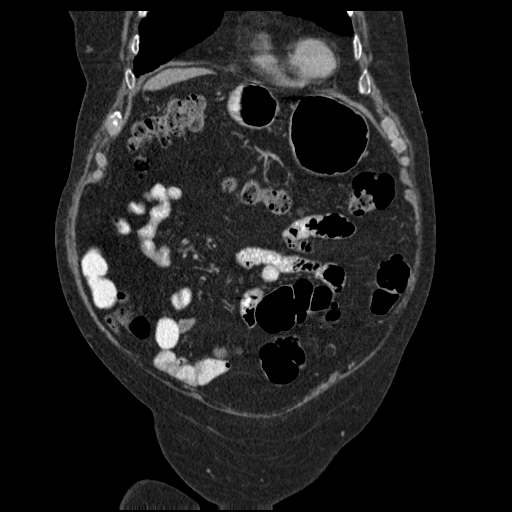
[im 73/165  soft-tissue]
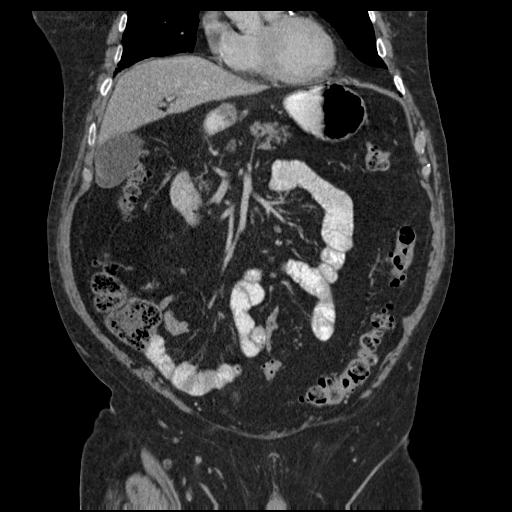
[im 92/165  soft-tissue]
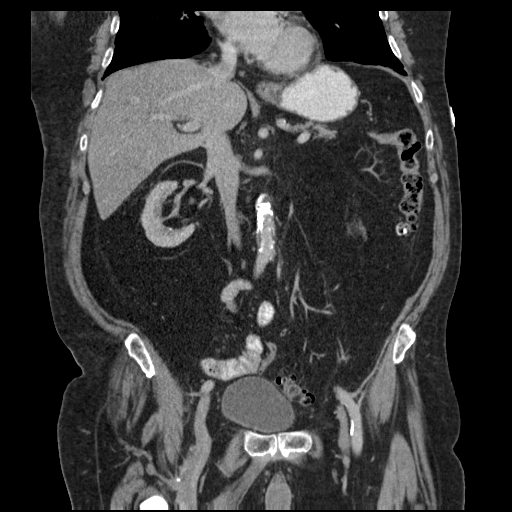

[16 of 46 positions shown; findings below may reference images not displayed]

FINDINGS: Lower chest: Bronchiectasis is noted in the lower lobes bilaterally.
No acute pulmonary disease is noted.

Hepatobiliary: Liver appears normal. Possible sludge seen in the
gallbladder lumen.

Pancreas: Unremarkable. No pancreatic ductal dilatation or
surrounding inflammatory changes.

Spleen: Normal in size without focal abnormality.

Adrenals/Urinary Tract: Adrenal glands appear normal. Severe left
renal atrophy is noted. Nonobstructive calculus is noted in the left
kidney. Right kidney appears normal. No hydronephrosis or right
ureteral dilatation is noted. However, there is a 9 mm
nonobstructive calculus seen in the distal right ureter. Urinary
bladder is unremarkable.

Stomach/Bowel: Diverticulosis of descending and sigmoid colon is
noted without inflammation. There is no evidence of bowel
obstruction. Status post appendectomy. Focal thickening of
rectosigmoid junction is noted concerning for possible neoplasm.

Vascular/Lymphatic: Aortic atherosclerosis. No enlarged abdominal or
pelvic lymph nodes.

Reproductive: Prostatic calcifications are noted. Prostate gland
appears to be normal in size.

Other: Small fat containing left inguinal hernia is noted.

Musculoskeletal: No acute or significant osseous findings.
IMPRESSION: Focal thickening of rectosigmoid junction is noted concerning for
possible neoplasm. Sigmoidoscopy is recommended for further
evaluation.

Bronchiectasis is noted in both lower lobes.

Probable sludge seen within the gallbladder.

Severe left renal atrophy with nonobstructive calculus.

No hydronephrosis or ureteral dilatation is noted. However, 9 mm
nonobstructive calculus is seen in the distal right ureter.

Diverticulosis of descending and sigmoid colon is noted without
inflammation.

Aortic atherosclerosis.
